# Patient Record
Sex: Female | Born: 1950 | Race: White | Hispanic: No | Smoking: Never smoker
Health system: Southern US, Community
[De-identification: ages and names within clinical notes are randomized; demographics above are authoritative.]

## PROBLEM LIST (undated history)

## (undated) DIAGNOSIS — R232 Flushing: Secondary | ICD-10-CM

## (undated) DIAGNOSIS — Z800 Family history of malignant neoplasm of digestive organs: Secondary | ICD-10-CM

## (undated) DIAGNOSIS — E785 Hyperlipidemia, unspecified: Secondary | ICD-10-CM

## (undated) DIAGNOSIS — C50919 Malignant neoplasm of unspecified site of unspecified female breast: Secondary | ICD-10-CM

## (undated) DIAGNOSIS — C50211 Malignant neoplasm of upper-inner quadrant of right female breast: Secondary | ICD-10-CM

## (undated) DIAGNOSIS — T7840XA Allergy, unspecified, initial encounter: Secondary | ICD-10-CM

## (undated) DIAGNOSIS — E039 Hypothyroidism, unspecified: Secondary | ICD-10-CM

## (undated) DIAGNOSIS — Z803 Family history of malignant neoplasm of breast: Secondary | ICD-10-CM

## (undated) DIAGNOSIS — IMO0001 Reserved for inherently not codable concepts without codable children: Secondary | ICD-10-CM

## (undated) DIAGNOSIS — E0590 Thyrotoxicosis, unspecified without thyrotoxic crisis or storm: Secondary | ICD-10-CM

## (undated) DIAGNOSIS — IMO0002 Reserved for concepts with insufficient information to code with codable children: Secondary | ICD-10-CM

## (undated) DIAGNOSIS — Z923 Personal history of irradiation: Secondary | ICD-10-CM

## (undated) HISTORY — PX: TUBAL LIGATION: SHX77

## (undated) HISTORY — DX: Malignant neoplasm of upper-inner quadrant of right female breast: C50.211

## (undated) HISTORY — DX: Hyperlipidemia, unspecified: E78.5

## (undated) HISTORY — DX: Allergy, unspecified, initial encounter: T78.40XA

## (undated) HISTORY — PX: COLONOSCOPY: SHX174

## (undated) HISTORY — DX: Family history of malignant neoplasm of breast: Z80.3

## (undated) HISTORY — PX: TONSILLECTOMY: SUR1361

## (undated) HISTORY — DX: Reserved for inherently not codable concepts without codable children: IMO0001

## (undated) HISTORY — DX: Reserved for concepts with insufficient information to code with codable children: IMO0002

## (undated) HISTORY — PX: WISDOM TOOTH EXTRACTION: SHX21

## (undated) HISTORY — DX: Family history of malignant neoplasm of digestive organs: Z80.0

## (undated) HISTORY — DX: Flushing: R23.2

## (undated) HISTORY — PX: KNEE ARTHROSCOPY: SUR90

## (undated) HISTORY — PX: DILATION AND CURETTAGE OF UTERUS: SHX78

---

## 1993-08-07 HISTORY — PX: PLACEMENT OF BREAST IMPLANTS: SHX6334

## 1997-03-10 HISTORY — PX: AUGMENTATION MAMMAPLASTY: SUR837

## 1999-09-19 ENCOUNTER — Encounter (INDEPENDENT_AMBULATORY_CARE_PROVIDER_SITE_OTHER): Payer: Self-pay | Admitting: Specialist

## 1999-09-19 ENCOUNTER — Other Ambulatory Visit: Admission: RE | Admit: 1999-09-19 | Discharge: 1999-09-19 | Payer: Self-pay | Admitting: Obstetrics and Gynecology

## 2000-04-25 ENCOUNTER — Encounter (INDEPENDENT_AMBULATORY_CARE_PROVIDER_SITE_OTHER): Payer: Self-pay | Admitting: Specialist

## 2000-04-25 ENCOUNTER — Other Ambulatory Visit: Admission: RE | Admit: 2000-04-25 | Discharge: 2000-04-25 | Payer: Self-pay | Admitting: Obstetrics and Gynecology

## 2000-09-25 ENCOUNTER — Other Ambulatory Visit: Admission: RE | Admit: 2000-09-25 | Discharge: 2000-09-25 | Payer: Self-pay | Admitting: Obstetrics and Gynecology

## 2000-09-25 ENCOUNTER — Encounter (INDEPENDENT_AMBULATORY_CARE_PROVIDER_SITE_OTHER): Payer: Self-pay | Admitting: Specialist

## 2000-10-05 ENCOUNTER — Encounter (INDEPENDENT_AMBULATORY_CARE_PROVIDER_SITE_OTHER): Payer: Self-pay

## 2000-10-05 ENCOUNTER — Other Ambulatory Visit: Admission: RE | Admit: 2000-10-05 | Discharge: 2000-10-05 | Payer: Self-pay | Admitting: Obstetrics and Gynecology

## 2001-04-25 ENCOUNTER — Other Ambulatory Visit: Admission: RE | Admit: 2001-04-25 | Discharge: 2001-04-25 | Payer: Self-pay | Admitting: Obstetrics and Gynecology

## 2001-09-25 ENCOUNTER — Other Ambulatory Visit: Admission: RE | Admit: 2001-09-25 | Discharge: 2001-09-25 | Payer: Self-pay | Admitting: Obstetrics and Gynecology

## 2002-03-25 ENCOUNTER — Other Ambulatory Visit: Admission: RE | Admit: 2002-03-25 | Discharge: 2002-03-25 | Payer: Self-pay | Admitting: Obstetrics and Gynecology

## 2002-10-09 ENCOUNTER — Other Ambulatory Visit: Admission: RE | Admit: 2002-10-09 | Discharge: 2002-10-09 | Payer: Self-pay | Admitting: Obstetrics and Gynecology

## 2003-08-11 ENCOUNTER — Other Ambulatory Visit: Admission: RE | Admit: 2003-08-11 | Discharge: 2003-08-11 | Payer: Self-pay | Admitting: Obstetrics and Gynecology

## 2004-02-23 ENCOUNTER — Other Ambulatory Visit: Admission: RE | Admit: 2004-02-23 | Discharge: 2004-02-23 | Payer: Self-pay | Admitting: Obstetrics and Gynecology

## 2004-10-21 ENCOUNTER — Other Ambulatory Visit: Admission: RE | Admit: 2004-10-21 | Discharge: 2004-10-21 | Payer: Self-pay | Admitting: Obstetrics and Gynecology

## 2005-03-17 ENCOUNTER — Other Ambulatory Visit: Admission: RE | Admit: 2005-03-17 | Discharge: 2005-03-17 | Payer: Self-pay | Admitting: Obstetrics and Gynecology

## 2005-07-14 ENCOUNTER — Encounter: Admission: RE | Admit: 2005-07-14 | Discharge: 2005-07-14 | Payer: Self-pay | Admitting: Family Medicine

## 2005-08-01 ENCOUNTER — Encounter: Admission: RE | Admit: 2005-08-01 | Discharge: 2005-08-01 | Payer: Self-pay | Admitting: Family Medicine

## 2006-05-09 ENCOUNTER — Other Ambulatory Visit: Admission: RE | Admit: 2006-05-09 | Discharge: 2006-05-09 | Payer: Self-pay | Admitting: Obstetrics and Gynecology

## 2008-02-19 ENCOUNTER — Ambulatory Visit: Payer: Self-pay | Admitting: Internal Medicine

## 2008-03-04 ENCOUNTER — Ambulatory Visit: Payer: Self-pay | Admitting: Internal Medicine

## 2010-09-06 NOTE — Procedures (Signed)
Summary: Colonoscopy   Colonoscopy  Procedure date:  03/04/2008  Findings:      Location:  [facility].    Procedures Next Due Date:    Colonoscopy: 02/2018  Patient Name: [name], [name]. MRN:  Procedure Procedures: Colonoscopy CPT: [id number].  Personnel: Endoscopist: Wilhemina Bonito. Marina Goodell, MD.  Exam Location: Exam performed in Outpatient Clinic. Outpatient  Patient Consent: Procedure, Alternatives, Risks and Benefits discussed, consent obtained, from patient. Consent was obtained by the RN.  Indications  Surveillance of: inflammatory polyp on colonoscopy 02-2003.  History  Current Medications: Patient is not currently taking Coumadin.  Pre-Exam Physical: Performed Mar 04, 2008. Cardio-pulmonary exam, Rectal exam, HEENT exam , Abdominal exam, Mental status exam WNL.  Comments: Pt. history reviewed/updated, physical exam performed prior to initiation of sedation?yes Exam Exam: Extent of exam reached: Cecum, extent intended: Cecum.  The cecum was identified by appendiceal orifice and IC valve. Patient position: on left side. Time to Cecum: 00:04:31. Time for Withdrawl: 00:12:23. Colon retroflexion performed. Images taken. ASA Classification: I. Tolerance: excellent.  Monitoring: Pulse and BP monitoring, Oximetry used. Supplemental O2 given.  Colon Prep Used Movi prep for colon prep. Prep results: excellent.  Sedation Meds: Patient assessed and found to be appropriate for moderate (conscious) sedation. Fentanyl 75 mcg. given IV. Versed 6 mg. given IV.  Findings NORMAL EXAM: Cecum to Rectum.   Assessment Normal examination.  Comments: NO POLYPS SEEN Events  Unplanned Interventions: No intervention was required.  Unplanned Events: There were no complications. Plans Disposition: After procedure patient sent to recovery. After recovery patient sent home.  Scheduling/Referral: Colonoscopy, to Wilhemina Bonito. Marina Goodell, MD, in 10 years for routine screening,      cc.   Trinna Post B. Kindl,MD

## 2010-09-06 NOTE — Miscellaneous (Signed)
Summary: lec previsit prep  Clinical Lists Changes  Medications: Added new medication of MOVIPREP 100 GM  SOLR (PEG-KCL-NACL-NASULF-NA ASC-C) As per prep instructions. - Signed Rx of MOVIPREP 100 GM  SOLR (PEG-KCL-NACL-NASULF-NA ASC-C) As per prep instructions.;  #1 x 0;  Signed;  Entered by: Wyona Almas RN;  Authorized by: Hilarie Fredrickson MD;  Method used: Electronic Observations: Added new observation of NKA: T (02/19/2008 16:13)    Prescriptions: MOVIPREP 100 GM  SOLR (PEG-KCL-NACL-NASULF-NA ASC-C) As per prep instructions.  #1 x 0   Entered by:   Wyona Almas RN   Authorized by:   Hilarie Fredrickson MD   Signed by:   Wyona Almas RN on 02/19/2008   Method used:   Electronically sent to ...       [facility]  [address] [id number]*       [address]       [place], [place]  [address]       Ph: [phone]       Fax: [phone]   RxID:   [id number]

## 2010-10-31 ENCOUNTER — Other Ambulatory Visit: Payer: Self-pay | Admitting: Dermatology

## 2011-05-05 LAB — GLUCOSE, CAPILLARY: Glucose-Capillary: 103 — ABNORMAL HIGH

## 2012-01-18 ENCOUNTER — Other Ambulatory Visit: Payer: Self-pay

## 2013-09-08 ENCOUNTER — Ambulatory Visit (INDEPENDENT_AMBULATORY_CARE_PROVIDER_SITE_OTHER): Payer: BC Managed Care – PPO

## 2013-09-08 ENCOUNTER — Encounter: Payer: Self-pay | Admitting: Podiatry

## 2013-09-08 ENCOUNTER — Ambulatory Visit (INDEPENDENT_AMBULATORY_CARE_PROVIDER_SITE_OTHER): Payer: BC Managed Care – PPO | Admitting: Podiatry

## 2013-09-08 VITALS — BP 111/60 | HR 68 | Resp 18

## 2013-09-08 DIAGNOSIS — M79609 Pain in unspecified limb: Secondary | ICD-10-CM

## 2013-09-08 DIAGNOSIS — M722 Plantar fascial fibromatosis: Secondary | ICD-10-CM

## 2013-09-08 MED ORDER — TRIAMCINOLONE ACETONIDE 10 MG/ML IJ SUSP
10.0000 mg | Freq: Once | INTRAMUSCULAR | Status: AC
  Administered 2013-09-08: 10 mg

## 2013-09-08 NOTE — Patient Instructions (Signed)
  Use over-the-counter Aleve by mouth 1 twice a day from 7-30 days. Reappoint if symptoms are not improving in 30 days   Plantar Fasciitis Plantar fasciitis is a common condition that causes foot pain. It is soreness (inflammation) of the band of tough fibrous tissue on the bottom of the foot that runs from the heel bone (calcaneus) to the ball of the foot. The cause of this soreness may be from excessive standing, poor fitting shoes, running on hard surfaces, being overweight, having an abnormal walk, or overuse (this is common in runners) of the painful foot or feet. It is also common in aerobic exercise dancers and ballet dancers. SYMPTOMS  Most people with plantar fasciitis complain of:  Severe pain in the morning on the bottom of their foot especially when taking the first steps out of bed. This pain recedes after a few minutes of walking.  Severe pain is experienced also during walking following a long period of inactivity.  Pain is worse when walking barefoot or up stairs DIAGNOSIS   Your caregiver will diagnose this condition by examining and feeling your foot.  Special tests such as X-rays of your foot, are usually not needed. PREVENTION   Consult a sports medicine professional before beginning a new exercise program.  Walking programs offer a good workout. With walking there is a lower chance of overuse injuries common to runners. There is less impact and less jarring of the joints.  Begin all new exercise programs slowly. If problems or pain develop, decrease the amount of time or distance until you are at a comfortable level.  Wear good shoes and replace them regularly.  Stretch your foot and the heel cords at the back of the ankle (Achilles tendon) both before and after exercise.  Run or exercise on even surfaces that are not hard. For example, asphalt is better than pavement.  Do not run barefoot on hard surfaces.  If using a treadmill, vary the incline.  Do not  continue to workout if you have foot or joint problems. Seek professional help if they do not improve. HOME CARE INSTRUCTIONS   Avoid activities that cause you pain until you recover.  Use ice or cold packs on the problem or painful areas after working out.  Only take over-the-counter or prescription medicines for pain, discomfort, or fever as directed by your caregiver.  Soft shoe inserts or athletic shoes with air or gel sole cushions may be helpful.  If problems continue or become more severe, consult a sports medicine caregiver or your own health care provider. Cortisone is a potent anti-inflammatory medication that may be injected into the painful area. You can discuss this treatment with your caregiver. MAKE SURE YOU:   Understand these instructions.  Will watch your condition.  Will get help right away if you are not doing well or get worse. Document Released: 04/18/2001 Document Revised: 10/16/2011 Document Reviewed: 06/17/2008 [facility] Patient Information 2014 [place], [place].

## 2013-09-08 NOTE — Progress Notes (Signed)
   Subjective:    Patient ID: [name], female    DOB: [birth year], 63 y.o.   MRN: [id number]  HPI My heels hurt on the side and on the bottom of both and been going on for a month and hurts to walk and hurts am and hurts after I sit and get up.  There is no history of direct injury.     Review of Systems  Musculoskeletal: Positive for gait problem.  All other systems reviewed and are negative.       Objective:   Physical Exam  Orientated x3 space white female  Vascular: The DP and PT pulses are 2/4 bilaterally  Neurological: Knee and ankle reflexes are strong and reactive bilaterally  Dermatological: Texture and turgor within normal limits  Musculoskeletal: Medium high arch contour noted bilaterally with the heel in a varus position upon weightbearing. There is palpable tenderness in the medial plantar heel area and mid arch bilaterally. There is slight palpable tenderness along the lateral fifth metatarsal area in the left foot without a palpable lesions.  X-rays taken today of the right and left feet demonstrates slight pointing of the inferior heels right and left with a high pitch calcaneus bilaterally.     Assessment & Plan:   Assessment: Bilateral plantar fasciitis Altered gait causing some lateral column pain on the left foot  Plan: The heels are prepped with alcohol and Betadine and 10 mg of Kenalog mixed with 10 mg of plain Xylocaine and 2.5 mg of plain Marcaine are injected into each right and left heels for Kenalog injection 1.  Shoeing and stretching discussed. Suggested over-the-counter Aleve 1 tablet twice a day from 7-30 days. An after visit summary provided for plantar fasciitis  Reappoint x30 days the symptoms are not improving.

## 2013-09-10 ENCOUNTER — Telehealth: Payer: Self-pay | Admitting: *Deleted

## 2013-09-10 NOTE — Telephone Encounter (Signed)
Pt states she did not get the stretching exercises.  Dr Phoebe Perch stretching instructions sent to pt's home address.

## 2013-11-20 ENCOUNTER — Other Ambulatory Visit: Payer: Self-pay | Admitting: Dermatology

## 2014-05-11 ENCOUNTER — Encounter: Payer: Self-pay | Admitting: Internal Medicine

## 2014-08-07 DIAGNOSIS — Z923 Personal history of irradiation: Secondary | ICD-10-CM

## 2014-08-07 HISTORY — DX: Personal history of irradiation: Z92.3

## 2014-08-27 ENCOUNTER — Other Ambulatory Visit: Payer: Self-pay | Admitting: Obstetrics and Gynecology

## 2014-08-27 DIAGNOSIS — R928 Other abnormal and inconclusive findings on diagnostic imaging of breast: Secondary | ICD-10-CM

## 2014-09-11 ENCOUNTER — Ambulatory Visit
Admission: RE | Admit: 2014-09-11 | Discharge: 2014-09-11 | Disposition: A | Discharge status: Home / Self Care | Payer: BLUE CROSS/BLUE SHIELD | Source: Ambulatory Visit | Attending: Obstetrics and Gynecology | Admitting: Obstetrics and Gynecology

## 2014-09-11 ENCOUNTER — Other Ambulatory Visit: Payer: Self-pay | Admitting: Obstetrics and Gynecology

## 2014-09-11 DIAGNOSIS — R928 Other abnormal and inconclusive findings on diagnostic imaging of breast: Secondary | ICD-10-CM

## 2014-09-11 DIAGNOSIS — C50919 Malignant neoplasm of unspecified site of unspecified female breast: Secondary | ICD-10-CM

## 2014-09-11 HISTORY — DX: Malignant neoplasm of unspecified site of unspecified female breast: C50.919

## 2014-09-11 HISTORY — PX: BREAST BIOPSY: SHX20

## 2014-09-14 ENCOUNTER — Other Ambulatory Visit: Payer: Self-pay | Admitting: Obstetrics and Gynecology

## 2014-09-14 DIAGNOSIS — C50911 Malignant neoplasm of unspecified site of right female breast: Secondary | ICD-10-CM

## 2014-09-15 ENCOUNTER — Telehealth: Payer: Self-pay | Admitting: *Deleted

## 2014-09-15 ENCOUNTER — Encounter: Payer: Self-pay | Admitting: *Deleted

## 2014-09-15 DIAGNOSIS — C50211 Malignant neoplasm of upper-inner quadrant of right female breast: Secondary | ICD-10-CM | POA: Insufficient documentation

## 2014-09-15 HISTORY — DX: Malignant neoplasm of upper-inner quadrant of right female breast: C50.211

## 2014-09-15 NOTE — Telephone Encounter (Signed)
Confirmed [facility] for 09/23/14 at 0800.  Instructions and contact information given.

## 2014-09-17 ENCOUNTER — Ambulatory Visit
Admission: RE | Admit: 2014-09-17 | Discharge: 2014-09-17 | Disposition: A | Discharge status: Home / Self Care | Payer: BLUE CROSS/BLUE SHIELD | Source: Ambulatory Visit | Attending: Obstetrics and Gynecology | Admitting: Obstetrics and Gynecology

## 2014-09-17 DIAGNOSIS — C50911 Malignant neoplasm of unspecified site of right female breast: Secondary | ICD-10-CM

## 2014-09-17 MED ORDER — GADOBENATE DIMEGLUMINE 529 MG/ML IV SOLN
13.0000 mL | Freq: Once | INTRAVENOUS | Status: AC | PRN
  Administered 2014-09-17: 13 mL via INTRAVENOUS

## 2014-09-22 ENCOUNTER — Other Ambulatory Visit: Payer: BLUE CROSS/BLUE SHIELD

## 2014-09-23 ENCOUNTER — Other Ambulatory Visit (HOSPITAL_BASED_OUTPATIENT_CLINIC_OR_DEPARTMENT_OTHER): Payer: BLUE CROSS/BLUE SHIELD

## 2014-09-23 ENCOUNTER — Ambulatory Visit: Payer: BLUE CROSS/BLUE SHIELD

## 2014-09-23 ENCOUNTER — Encounter: Payer: Self-pay | Admitting: Hematology

## 2014-09-23 ENCOUNTER — Ambulatory Visit (HOSPITAL_BASED_OUTPATIENT_CLINIC_OR_DEPARTMENT_OTHER): Payer: BLUE CROSS/BLUE SHIELD | Admitting: Hematology

## 2014-09-23 ENCOUNTER — Ambulatory Visit
Admission: RE | Admit: 2014-09-23 | Discharge: 2014-09-23 | Disposition: A | Discharge status: Home / Self Care | Payer: BLUE CROSS/BLUE SHIELD | Source: Ambulatory Visit | Attending: Radiation Oncology | Admitting: Radiation Oncology

## 2014-09-23 ENCOUNTER — Encounter: Payer: Self-pay | Admitting: Skilled Nursing Facility1

## 2014-09-23 ENCOUNTER — Encounter: Payer: Self-pay | Admitting: *Deleted

## 2014-09-23 VITALS — BP 128/65 | HR 66 | Temp 98.6°F | Resp 18 | Ht 61.0 in | Wt 144.9 lb

## 2014-09-23 DIAGNOSIS — D0511 Intraductal carcinoma in situ of right breast: Secondary | ICD-10-CM

## 2014-09-23 DIAGNOSIS — Z171 Estrogen receptor negative status [ER-]: Secondary | ICD-10-CM

## 2014-09-23 DIAGNOSIS — C50211 Malignant neoplasm of upper-inner quadrant of right female breast: Secondary | ICD-10-CM

## 2014-09-23 LAB — CBC WITH DIFFERENTIAL/PLATELET
BASO%: 0.7 % (ref 0.0–2.0)
Basophils Absolute: 0 10*3/uL (ref 0.0–0.1)
EOS%: 0.7 % (ref 0.0–7.0)
Eosinophils Absolute: 0 10*3/uL (ref 0.0–0.5)
HCT: 43.2 % (ref 34.8–46.6)
HGB: 13.9 g/dL (ref 11.6–15.9)
LYMPH%: 38.4 % (ref 14.0–49.7)
MCH: 28.3 pg (ref 25.1–34.0)
MCHC: 32.2 g/dL (ref 31.5–36.0)
MCV: 88 fL (ref 79.5–101.0)
MONO#: 0.5 10*3/uL (ref 0.1–0.9)
MONO%: 7.4 % (ref 0.0–14.0)
NEUT#: 3.8 10*3/uL (ref 1.5–6.5)
NEUT%: 52.8 % (ref 38.4–76.8)
Platelets: 270 10*3/uL (ref 145–400)
RBC: 4.91 10*6/uL (ref 3.70–5.45)
RDW: 13.4 % (ref 11.2–14.5)
WBC: 7.1 10*3/uL (ref 3.9–10.3)
lymph#: 2.7 10*3/uL (ref 0.9–3.3)

## 2014-09-23 LAB — COMPREHENSIVE METABOLIC PANEL (CC13)
ALT: 20 U/L (ref 0–55)
AST: 19 U/L (ref 5–34)
Albumin: 3.9 g/dL (ref 3.5–5.0)
Alkaline Phosphatase: 88 U/L (ref 40–150)
Anion Gap: 9 mEq/L (ref 3–11)
BUN: 22.2 mg/dL (ref 7.0–26.0)
CO2: 27 mEq/L (ref 22–29)
Calcium: 9.5 mg/dL (ref 8.4–10.4)
Chloride: 110 mEq/L — ABNORMAL HIGH (ref 98–109)
Creatinine: 0.8 mg/dL (ref 0.6–1.1)
EGFR: 81 mL/min/{1.73_m2} — ABNORMAL LOW (ref >90)
Glucose: 95 mg/dl (ref 70–140)
Potassium: 4.5 mEq/L (ref 3.5–5.1)
Sodium: 145 mEq/L (ref 136–145)
Total Bilirubin: 0.37 mg/dL (ref 0.20–1.20)
Total Protein: 6.6 g/dL (ref 6.4–8.3)

## 2014-09-23 NOTE — Progress Notes (Signed)
[name] is a very pleasant 64 y.o. 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  Biopsy results revealed the tumor's hormone status as ER negative and PR negative.   She presents today with her husband to the [facility]) for treatment consideration and recommendations from the breast surgeon, radiation oncologist, and medical oncologist.     I briefly met with [name] and her husband during her [facility] visit today. We discussed the purpose of the Survivorship Clinic, which will include monitoring for recurrence, coordinating completion of age and gender-appropriate cancer screenings, promotion of overall wellness, as well as managing potential late/long-term side effects of anti-cancer treatments.    As of today, the treatment plan for [name] will likely include surgery and radiation therapy.  She will meet with the Genetics Counselor due to her family history of breast cancer.  The intent of treatment for [name] is cure, therefore she will be eligible for the Survivorship Clinic upon her completion of treatment.  Her survivorship care plan (SCP) document will be drafted and updated throughout the course of her treatment trajectory. She will receive the SCP in an office visit with myself in the Survivorship Clinic once she has completed treatment.   [name] was encouraged to ask questions and all questions were answered to her satisfaction.  She was given my business card and encouraged to contact me with any concerns regarding survivorship.  I look forward to participating in her care.   Mike Craze, NP [place] [phone]

## 2014-09-23 NOTE — Progress Notes (Signed)
Checked in new pt with no financial concerns prior to seeing the dr.  Informed pt if chemo is part of her treatment Raquel will call to see if Josem Kaufmann is req and will obtain it if it is as well as contact foundations that offer copay assistance for chemo if needed.  She has Raquel's card for any billing questions or concerns.

## 2014-09-23 NOTE — Progress Notes (Signed)
[place] Clinical Social Work [employer] Psychosocial Distress Screening  Patient completed distress screening protocol and scored a 2 on the Psychosocial Distress Thermometer which indicates mild distress. Clinical Social Worker met with patient and patients husband in [facility] to assess for distress and other psychosocial needs.  Patient stated she was doing "ok" and felt comfortable with her treatment plan, but was anxious to get the remaining test complete.  CSW and patient discussed common feelings and emotions when diagnosed with cancer.  CSW informed patient on the support team and support services at [facility], and patient was agreeable to an [employer] referral.  CSW provided contact information and encouraged patient to cal with any questions or concerns.        ONCBCN DISTRESS SCREENING 09/23/2014  Screening Type Initial Screening  Distress experienced in past week (1-10) 2  Family Problem type Children  Spiritual/Religous concerns type Relating to God  Information Concerns Type Lack of info about treatment  Physical Problem type Constipation/diarrhea  Physician notified of physical symptoms Yes  Referral to clinical psychology No  Referral to clinical social work Yes  Referral to dietition No  Referral to financial advocate No  Referral to support programs Yes  Referral to palliative care No   Johnnye Lana, MSW, LCSW, OSW-C Clinical Social Worker [place] [phone]

## 2014-09-23 NOTE — Progress Notes (Signed)
Subjective:     Patient ID: [name], female   DOB: Jul 14, 1951, 64 y.o.   MRN: [id number]  HPI   Review of Systems     Objective:   Physical Exam For the patient to understand and be given the tools to implement a healthy plant based diet during their cancer diagnosis.    Assessment:        Patient was seen today and found to be in good spirits and was accompanied by her seemingly supportive husband. Patients current/relevant medications: biotin, multi-vitamin, miralax, and simvastatin. Patient states she usually goes three days without having a bowel movement. Patient states the lack of defecation has not affected her appetite. Patient states she is taking biotin for her hair and nail health.  Patients weight (09/23/14) is 144 pounds at 78'1'' with a BMI of 27.4.  Plan:     Dietitian educated the patient on implementing a plant based diet by incorporating more plant proteins, fruits, and vegetables. As a part of a healthy routine physical activity was discussed. The importance of legitimate, evidence based information was discussed and examples were given. A folder of evidence based information with a focus on a plant based diet and general nutrition during cancer was given to the patient.  Dietitian educated patient on natural therapies in order to move her bowel: consuming fruits and vegetables, drinking enough water, and being physically active every day.  Dietitian educated the patient on the regulation of supplements and the lacking research for biotin to be used in this manner.  As a part of the continuum of care the cancer dietitian's contact information was given to the patient in the event they would like to have a follow up appointment.

## 2014-09-23 NOTE — Progress Notes (Signed)
  Radiation Oncology         [phone]) [phone] ________________________________  Initial outpatient Consultation - Date: 09/23/2014   Name: [name] MRN: [id number]   DOB: Jan 21, 1951  REFERRING PHYSICIAN: Erroll Luna, MD  DIAGNOSIS:    ICD-9-CM ICD-10-CM   1. Breast cancer of upper-inner quadrant of right female breast 174.2 C50.211     STAGE: Breast cancer of upper-inner quadrant of right female breast   Staging form: Breast, AJCC 7th Edition     Clinical stage from 09/23/2014: Stage 0 (Tis (DCIS), N0, M0) - Unsigned  HISTORY OF PRESENT ILLNESS::[name] is a 64 y.o. female  Who had implants placed in the 1990s. She had a screening mammogram performed which showed a new area of calcifications in the right breast. These measured about 2.6 cm. She had a biopsy which showed HG DCIS with calcifications and necrosis. There was suspicion for invasive disease. The DCIS was ER and PR+. Her father had breast, prostate and head and neck cancer. She is scheduled for genetics tomorrow. She is accompanied by her husband. She is GXP2 with her first live birth at 32. She is post menopausal with her first period at 40 or 67.   PREVIOUS RADIATION THERAPY: No  Past medical, social and family history were reviewed in the electronic chart. Review of symptoms was reviewed in the electronic chart. Medications were reviewed in the electronic chart.   PHYSICAL EXAM: There were no vitals filed for this visit.. . She is a pleasant female with implants in place. She has biopsy change near the nipple of the right breast. She has no palpable abnormalities of the left breast. No palpable cervical, supraclavicular or axillary adenopathy. She is alert and oriented x 3.   IMPRESSION: DCIS (suspicious areas for microinvasion)  PLAN:I spoke to the patient today regarding her diagnosis and options for treatment. We discussed the equivalence in terms of survival and local failure between mastectomy and breast  conservation. We discussed the role of radiation in decreasing local failures in patients who undergo lumpectomy. We discussed the process of simulation and the placement tattoos. We discussed 4-6 weeks of treatment as an outpatient. We discussed the possibility of asymptomatic lung damage. We discussed the low likelihood of secondary malignancies. We discussed the possible side effects including but not limited to skin redness, fatigue, permanent skin darkening, and breast swelling.   We discussed that these recommendations would be based on negative genetic counseling. If her genetic counseling reveals a BRCA mutation she may elect for bilateral mastectomies.   We also discussed the difference between the radiation her father received for head and neck cancer versus radiation for breast cancer.    I will plan on seeing her back after her surgery. She met with our navigators, dietician and Education officer, museum.   I spent 60 minutes  face to face with the patient and more than 50% of that time was spent in counseling and/or coordination of care.   ------------------------------------------------  Thea Silversmith, MD

## 2014-09-24 ENCOUNTER — Other Ambulatory Visit: Payer: BLUE CROSS/BLUE SHIELD

## 2014-09-24 ENCOUNTER — Ambulatory Visit (HOSPITAL_BASED_OUTPATIENT_CLINIC_OR_DEPARTMENT_OTHER): Payer: BLUE CROSS/BLUE SHIELD | Admitting: Genetic Counselor

## 2014-09-24 ENCOUNTER — Encounter: Payer: Self-pay | Admitting: Genetic Counselor

## 2014-09-24 DIAGNOSIS — Z803 Family history of malignant neoplasm of breast: Secondary | ICD-10-CM

## 2014-09-24 DIAGNOSIS — D0511 Intraductal carcinoma in situ of right breast: Secondary | ICD-10-CM

## 2014-09-24 DIAGNOSIS — Z800 Family history of malignant neoplasm of digestive organs: Secondary | ICD-10-CM | POA: Insufficient documentation

## 2014-09-24 DIAGNOSIS — Z315 Encounter for genetic counseling: Secondary | ICD-10-CM

## 2014-09-24 DIAGNOSIS — D0510 Intraductal carcinoma in situ of unspecified breast: Secondary | ICD-10-CM | POA: Insufficient documentation

## 2014-09-24 NOTE — Progress Notes (Signed)
[facility] Patient Visit  REFERRING PROVIDER: Ricke Hey, MD [place], [place] [address]  PRIMARY PROVIDER:  Ricke Hey, MD  PRIMARY REASON FOR VISIT:  1. DCIS (ductal carcinoma in situ), right   2. Family history of malignant neoplasm of breast   3. Family history of malignant neoplasm of gastrointestinal tract     HISTORY OF PRESENT ILLNESS:   [name], a 64 y.o. female, was seen for a [facility] cancer genetics consultation at the request of Dr. Alyson Turner due to a personal and family history of cancer.  [name] presents to clinic today to discuss the possibility of a hereditary predisposition to cancer, genetic testing, and to further clarify her future cancer risks, as well as potential cancer risks for family members. She also would like to use genetic test results for surgical decisions for her breast cancer diagnosis.  CANCER HISTORY:    Breast cancer of upper-inner quadrant of right female breast   09/11/2014 Breast US In the right breast at 12 o'clock, 3 cm the nipple measuring approximately 2 cm x 0.9 cm x 1.6 cm in size. No discrete suspicious mass.   09/11/2014 Mammogram In the right breast, an area of pleomorphic calcifications and associated architectural distortion and irregular density lies in the 1 o'clock position of the right breast. No other suspicious findings in the right breast.    09/14/2014 Receptors her2 Estrogen Receptor: 0%, NEGATIVE Progesterone Receptor: 0%, NEGATIVE   09/14/2014 Initial Biopsy Breast, right, needle core biopsy, 11-12 o'clock, upper outer quadrant - HIGH GRADE DUCTAL CARCINOMA IN SITU WITH NECROSIS AND CALCIFICATIONS   09/15/2014 Initial Diagnosis Breast cancer of upper-inner quadrant of right female breast   09/17/2014 Breast MRI Non mass enhancement in the upper outer quadrant of the right breast, biopsy proven high-grade DCIS. Measurements are given above. Post biopsy  changes are present at the anterior margin of the enhancement.    09/17/2014 Breast MRI No evidence of malignancy elsewhere in either breast. 3. Biopsy proven fibrocystic changes in the upper outer periareolar right breast manifested as a 9 x 7 mm mass. 4. No pathologic lymphadenopathy.   Past Medical History  Diagnosis Date   Allergy    Hyperlipidemia    Breast cancer of upper-inner quadrant of right female breast 09/15/2014   Hot flashes     Past Surgical History  Procedure Laterality Date   Placement of breast implants      History   Social History   Marital Status: Married    Spouse Name: N/A   Number of Children: N/A   Years of Education: N/A   Social History Main Topics   Smoking status: Never Smoker    Smokeless tobacco: Never Used   Alcohol Use: Yes     Comment: occasional   Drug Use: No   Sexual Activity: Not on file   Other Topics Concern   Not on file   Social History Narrative     FAMILY HISTORY:  During the visit, a 4-generation pedigree was obtained. A copy of the pedigree with be scanned into Epic under the Media tab. Significant family history diagnoses include the following: Family History  Problem Relation Age of Onset   Breast cancer Father 18   Prostate cancer Father 58   Throat cancer Father 37   Pancreatic cancer Maternal Uncle 75   Pancreatic cancer Maternal Grandmother 81   Cancer Maternal Grandfather 68    leukemia  [name] ancestry is of Caucasian descent. There is no known Jewish ancestry or consanguinity.  GENETIC COUNSELING ASSESSMENT:  [name] is a 64 y.o. female with a personal and family history of cancer suggestive of a hereditary predisposition to cancer. We, therefore, discussed and recommended the following at today's visit.   DISCUSSION:  We reviewed the characteristics, features and inheritance patterns of hereditary cancer syndromes. We also discussed genetic testing, including the appropriate  family members to test, the process of testing, insurance coverage and turn-around-time for results. We discussed the implications of a negative, positive and/or variant of uncertain significant result.   We recommended [name] pursue genetic testing for the BRCAplus gene panel which looks for mutations in the BRCA1, BRCA2, PTEN, CDH1, PALB2 and TP53 genes so that she can use this information for surgical decisions for her diagnosis. If this test is negative, we recommended she pursue reflex testing for the BreastNext gene panel.   PLAN:  Based on our above recommendation, [name] wished to pursue genetic testing and the blood sample was drawn and will be sent to [facility] for analysis. Results should be available within approximately 2 weeks time for the BRCAplus gene panel, at which point they will be disclosed by telephone to [name], as will any additional recommendations warranted by these results. If BreastNext gene panel testing is pursued, these results will take an additional 2-3 weeks and again will be disclosed to her once available. Lastly, we encouraged [name] to remain in contact with cancer genetics annually so that we can continuously update the family history and inform her of any changes in cancer genetics and testing that may be of benefit for this family.   Ms.  Turner questions were answered to her satisfaction today. Our contact information was provided should additional questions or concerns arise. Thank you for the referral and allowing [place] to share in the care of your patient.   Jocelyn A. Fine, MS, CGC Certified Psychologist, sport and [email or web address] phone: [phone]  The patient was seen for a total of 30 minutes in face-to-face genetic counseling.  This patient was discussed with Dr. Burr Medico who agrees with the above.    ______________________________________________________________________ For Office Staff:  Number of people  involved in session including genetic counselor: 2 Was an intern or student involved with case: not applicable

## 2014-09-27 ENCOUNTER — Encounter: Payer: Self-pay | Admitting: Hematology

## 2014-09-27 NOTE — Progress Notes (Signed)
[place]  [phone]) [phone] Fax:([phone]) [place] Note   Patient Care Team: Ricke Hey, MD as PCP - General (Family Medicine) Erroll Luna, MD as Consulting Physician (General Surgery) [name], MD as Consulting Physician (Hematology) Thea Silversmith, MD as Consulting Physician (Radiation Oncology) Roselee Culver, RN as Registered Nurse [facility], RN as Registered Nurse Trinda Pascal, NP as Nurse Practitioner (Nurse Practitioner) 09/27/2014  CHIEF COMPLAINTS/PURPOSE OF CONSULTATION:  Newly diagnosed right breast DCIS    Breast cancer of upper-inner quadrant of right female breast   09/11/2014 Breast US In the right breast at 12 o'clock, 3 cm the nipple measuring approximately 2 cm x 0.9 cm x 1.6 cm in size. No discrete suspicious mass.   09/11/2014 Mammogram In the right breast, an area of pleomorphic calcifications and associated architectural distortion and irregular density lies in the 1 o'clock position of the right breast. No other suspicious findings in the right breast.    09/14/2014 Receptors her2 Estrogen Receptor: 0%, NEGATIVE Progesterone Receptor: 0%, NEGATIVE   09/14/2014 Initial Biopsy Breast, right, needle core biopsy, 11-12 o'clock, upper outer quadrant - HIGH GRADE DUCTAL CARCINOMA IN SITU WITH NECROSIS AND CALCIFICATIONS   09/15/2014 Initial Diagnosis Breast cancer of upper-inner quadrant of right female breast   09/17/2014 Breast MRI Non mass enhancement in the upper outer quadrant of the right breast, biopsy proven high-grade DCIS. Measurements are given above. Post biopsy changes are present at the anterior margin of the enhancement.    09/17/2014 Breast MRI No evidence of malignancy elsewhere in either breast. 3. Biopsy proven fibrocystic changes in the upper outer periareolar right breast manifested as a 9 x 7 mm mass. 4. No pathologic lymphadenopathy.     HISTORY OF PRESENTING ILLNESS:  [name] 64 y.o. female presents to our multidisciplinary breast clinic to discuss the management of her newly diagnosed breast cancer.  This was discovered by screening mammogram on 09/11/2014, which showed an area of pleomorphic calcification measuring 2 cm. She underwent a core needle biopsy on February ace 2016 which showed high-grade ductal carcinoma in situ with necrosis and calcification. Her MI on every 06/27/2015 reviewed no other additional lesions.  She feels well overall, denies any pain, dyspnea, or other symptoms. She has good appetite and energy level. No recent weight loss.  She had history of bilateral breast implants about 18-20 years ago. She had a right breast biopsy in 2013 which was negative for malignancy.  MEDICAL HISTORY:  Past Medical History  Diagnosis Date  . Allergy   . Hyperlipidemia   . Breast cancer of upper-inner quadrant of right female breast 09/15/2014  . Hot flashes     SURGICAL HISTORY: Past Surgical History  Procedure Laterality Date  . Placement of breast implants      SOCIAL HISTORY: History   Social History  . Marital Status: Married    Spouse Name: N/A  . Number of Children: N/A  . Years of Education: N/A   Occupational History  . Not on file.   Social History Main Topics  . Smoking status: Never Smoker   . Smokeless tobacco: Never Used  . Alcohol Use: Yes     Comment: occasional  . Drug Use: No  . Sexual Activity: Not on file   Other Topics Concern  . Not on file   Social History Narrative   GYN HISTORY  Menarchal: 13 or 14 LMP: 2011 Contraceptive: yes, since 1974 HRT: no G2P2  FAMILY HISTORY: Family History  Problem Relation Age of Onset  . Breast cancer Father 74  . Prostate cancer Father 26  . Throat cancer Father 72  . Pancreatic cancer Maternal Uncle 75  . Pancreatic cancer Maternal Grandmother 48  . Cancer Maternal Grandfather 58    leukemia    ALLERGIES:  has No Known Allergies.  MEDICATIONS:    Current Outpatient Prescriptions  Medication Sig Dispense Refill  . Biotin 5000 MCG CAPS Take 1,000 mcg by mouth daily.    . Lactobacillus-Inulin (CULTURELLE DIGESTIVE HEALTH PO) Take 1-2 tablets by mouth every other day.    . Multiple Vitamin (MULTIVITAMIN) tablet Take 1 tablet by mouth daily.    . polyethylene glycol (MIRALAX / GLYCOLAX) packet Take 17 g by mouth as needed. Takes 1-2  X's weekly    . sertraline (ZOLOFT) 100 MG tablet Take 100 mg by mouth daily. Takes 1-2 daily    . simvastatin (ZOCOR) 10 MG tablet Take 10 mg by mouth daily.    . clonazePAM (KLONOPIN) 0.5 MG tablet Take 0.5 mg by mouth 2 (two) times daily as needed for anxiety.     No current facility-administered medications for this visit.    REVIEW OF SYSTEMS:   Constitutional: Denies fevers, chills or abnormal night sweats Eyes: Denies blurriness of vision, double vision or watery eyes Ears, nose, mouth, throat, and face: Denies mucositis or sore throat Respiratory: Denies cough, dyspnea or wheezes Cardiovascular: Denies palpitation, chest discomfort or lower extremity swelling Gastrointestinal:  Denies nausea, heartburn or change in bowel habits Skin: Denies abnormal skin rashes Lymphatics: Denies new lymphadenopathy or easy bruising Neurological:Denies numbness, tingling or new weaknesses Behavioral/Psych: Mood is stable, no new changes  All other systems were reviewed with the patient and are negative.  PHYSICAL EXAMINATION: ECOG PERFORMANCE STATUS: 0 - Asymptomatic  Filed Vitals:   09/23/14 0834  BP: 128/65  Pulse: 66  Temp: 98.6 F (37 C)  Resp: 18   Filed Weights   09/23/14 0834  Weight: 144 lb 14.4 oz (65.726 kg)    GENERAL:alert, no distress and comfortable SKIN: skin color, texture, turgor are normal, no rashes or significant lesions EYES: normal, conjunctiva are pink and non-injected, sclera clear OROPHARYNX:no exudate, no erythema and lips, buccal mucosa, and tongue normal  NECK:  supple, thyroid normal size, non-tender, without nodularity LYMPH:  no palpable lymphadenopathy in the cervical, axillary or inguinal LUNGS: clear to auscultation and percussion with normal breathing effort HEART: regular rate & rhythm and no murmurs and no lower extremity edema ABDOMEN:abdomen soft, non-tender and normal bowel sounds Musculoskeletal:no cyanosis of digits and no clubbing  PSYCH: alert & oriented x 3 with fluent speech NEURO: no focal motor/sensory deficits Breasts: Breast inspection showed them to be symmetrical with no nipple discharge. Palpation of the breasts and axilla revealed no obvious mass that I could appreciate.   LABORATORY DATA:  I have reviewed the data as listed Lab Results  Component Value Date   WBC 7.1 09/23/2014   HGB 13.9 09/23/2014   HCT 43.2 09/23/2014   MCV 88.0 09/23/2014   PLT 270 09/23/2014    Recent Labs  09/23/14 0815  NA 145  K 4.5  CO2 27  GLUCOSE 95  BUN 22.2  CREATININE 0.8  CALCIUM 9.5  PROT 6.6  ALBUMIN 3.9  AST 19  ALT 20  ALKPHOS 88  BILITOT 0.37   PATHOLOGY REPORT Diagnosis 09/11/2014  Breast, right, needle core biopsy, 11-12 o'clock, upper outer quadrant - HIGH GRADE  DUCTAL CARCINOMA IN SITU WITH NECROSIS AND CALCIFICATIONS. SEE COMMENT. 1 of 2 FINAL for NARELLE, SCHOENING [phone]) Microscopic Comment There are very focal areas of tumor that are suspicious but not diagnostic of invasion. Although grade of tumor is best assessed at resection, with these biopsies, the in situ carcinoma is grade III. Breast prognostic studies are pending  Results: IMMUNOHISTOCHEMICAL AND MORPHOMETRIC ANALYSIS BY THE AUTOMATED CELLULAR IMAGING SYSTEM (ACIS) Estrogen Receptor: 0%, NEGATIVE Progesterone Receptor: 0%, NEGATIVE COMMENT: The negative hormone receptor study(ies) in this case have an internal positive control.   Mr Breast Bilateral W Wo Contrast 09/17/2014   IMPRESSION: 1. Non mass enhancement in the upper outer  quadrant of the right breast, biopsy proven high-grade DCIS, measuring approximately 2.6 x 1.7 x 2.2 cm  Post biopsy changes are present at the anterior margin of the enhancement. 2. No evidence of malignancy elsewhere in either breast. 3. Biopsy proven fibrocystic changes in the upper outer periareolar right breast manifested as a 9 x 7 mm mass. 4. No pathologic lymphadenopathy.  RECOMMENDATION: Treatment plan for the biopsy-proven DCIS in the upper outer right breast.  BI-RADS CATEGORY  6: Known biopsy-proven malignancy.   Electronically Signed   By: Hulan Saas M.D.   On: 09/17/2014 15:21   US Breast Ltd Uni Right Inc Axilla 09/11/2014   FINDINGS: In the right breast, an area of pleomorphic calcifications and associated architectural distortion and irregular density lies in the 1 o'clock position of the right breast. No other suspicious findings in the right breast. No evidence of axillary adenopathy on mammography.  Targeted ultrasound is performed, showing a heterogeneous area of irregular hypo echogenicity with intervening areas of increased echogenicity in the right breast at 12 o'clock, 3 cm the nipple measuring approximately 2 cm x 0.9 cm x 1.6 cm in size. No discrete suspicious mass.  IMPRESSION: Right breast calcifications and distortion suspicious for breast carcinoma. Biopsy is indicated.  RECOMMENDATION: Patient will undergo stereotactic guided core needle biopsy today.  I have discussed the findings and recommendations with the patient. Results were also provided in writing at the conclusion of the visit. If applicable, a reminder letter will be sent to the patient regarding the next appointment.  BI-RADS CATEGORY  4: Suspicious.   Electronically Signed   By: Amie Portland M.D.   On: 09/11/2014 10:59    ASSESSMENT & PLAN:  64 year-old postmenopausal woman, with past medical history of bilateral breast implants, benign right breast biopsy 3 years ago, who presented with a abnormal screening  mammogram.  1. 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 Although there is no diagnostic evidence of invasive disease, there are focal area of suspicious for early invasion of her biopsy. -She was seen by breast surgeon Dr. Luisa Hart today, and is planned to have surgery in a few weeks, likely lumpectomy. -If her surgical past reviewed no invasive breast cancer, there is no role for adjuvant chemotherapy or endocrine therapy. I did discuss in the prevention with aromatase inhibitor, although the benefit in ER/PR negative DCIS is controversial. -If her surgical past did reveal invasive component, we'll check HER-2 and likely she likely would need adjuvant chemotherapy. -She was seen by radiation oncologist Dr. Filiberto Pinks was today, and likely will have adjuvant radiation to reduce her risk of breast cancer in the future. -I discussed our survivorship clinic. She is interested. -Her father had breast cancer at age of 69,  she will be referred to see genetic counselor for genetic testing.  Follow-up: If her surgical path showed non-invasive carcinoma only, she will been seen and followed by survivorship clinic. I'll see her back only if her surgical path reviews invasive carcinoma.  All questions were answered. The patient knows to call the clinic with any problems, questions or concerns. I spent 40 minutes counseling the patient face to face. The total time spent in the appointment was 45 minutes and more than 50% was on counseling.     [name], MD 09/27/2014 12:12 PM

## 2014-09-28 ENCOUNTER — Telehealth: Payer: Self-pay | Admitting: *Deleted

## 2014-09-28 NOTE — Telephone Encounter (Signed)
Spoke with patient from [facility] 2/17.  She is doing well. She had genetic testing done on 2/18 and will wait for these results before making a surgery decision.  Encouraged her to call with any needs or concerns.

## 2014-10-07 ENCOUNTER — Encounter: Payer: Self-pay | Admitting: Genetic Counselor

## 2014-10-07 DIAGNOSIS — D0511 Intraductal carcinoma in situ of right breast: Secondary | ICD-10-CM

## 2014-10-07 DIAGNOSIS — Z803 Family history of malignant neoplasm of breast: Secondary | ICD-10-CM

## 2014-10-07 DIAGNOSIS — Z800 Family history of malignant neoplasm of digestive organs: Secondary | ICD-10-CM

## 2014-10-07 NOTE — Progress Notes (Signed)
Ms. Treloar recently had cancer genetic counseling at [facility] on 09/24/2014. At that time, it was recommended she pursue genetic testing for the BRCAPlus gene panel which looks for mutations in the following genes: BRCA1, BRCA2, PALB2, CDH1, PTEN and TP53. Her BRCAPlus gene panel, which was performed at [facility], has returned and is negative for mutations in any of these genes. These results were disclosed to her today. Per her request, reflex testing for the BreastNext gene panel at [facility] was initiated. Results for the gene panel should be available in 2-3 more weeks and we will contact her to discuss these results and recommendations warranted by these results.

## 2014-10-09 ENCOUNTER — Ambulatory Visit (INDEPENDENT_AMBULATORY_CARE_PROVIDER_SITE_OTHER): Payer: Self-pay | Admitting: Surgery

## 2014-10-09 DIAGNOSIS — D0511 Intraductal carcinoma in situ of right breast: Secondary | ICD-10-CM

## 2014-10-09 NOTE — H&P (Signed)
[name] 09/23/2014 7:44 AM Location: [facility] Patient #: [id number] DOB: 11-17-1950 Undefined / Language: [name] / Race: Undefined Female  History of Present Illness Jocelyn Moores A. Mac Dowdell MD; 09/23/2014 9:32 AM) Patient words: Pt presents to the [facility] at the request of [name] for abnormal mammogram showing pleomorphic right breast microcalcifications core bx shown to be DCIS. PT has 64 yo subpectoral implants that are saline. Denies hx of breast pain, mass or nipple discharge. FATHER HAD BREAST CANCER.         ADDITIONAL INFORMATION: PROGNOSTIC INDICATORS - ACIS Results: IMMUNOHISTOCHEMICAL AND MORPHOMETRIC ANALYSIS BY THE AUTOMATED CELLULAR IMAGING SYSTEM (ACIS) Estrogen Receptor: 0%, NEGATIVE Progesterone Receptor: 0%, NEGATIVE COMMENT: The negative hormone receptor study(ies) in this case have an internal positive control. REFERENCE RANGE ESTROGEN RECEPTOR NEGATIVE <1% POSITIVE =>1% PROGESTERONE RECEPTOR NEGATIVE <1% POSITIVE =>1% All controls stained appropriately Jocelyn Cutter MD Pathologist, Electronic Signature ( Signed 09/16/2014) FINAL DIAGNOSIS Diagnosis Breast, right, needle core biopsy, 11-12 o'clock, upper outer quadrant - HIGH GRADE DUCTAL CARCINOMA IN SITU WITH NECROSIS AND CALCIFICATIONS. SEE COMMENT. 1 of 2 FINAL for [name] [phone]) Microscopic Comment There are very focal areas of tumor that are suspicious but not diagnostic of invasion. Although grade of tumor is best assessed at resection, with these biopsies, the in situ carcinoma is grade III. Breast prognostic studies are pending and will be reported in an addendum. The case was reviewed with Jocelyn [name] who concurs. (CRR:gt, 09/14/14) [name] Pathologist, Electronic Signature (Case signed 09/14/2014) Specimen Gross and Clinical Information CLINICAL DATA: Screening detected pleomorphic calcifications in the upper outer quadrant of the right breast,  middle depth, spanning approximately 2 cm, biopsy proven high-grade DCIS, ER and PR negative. Patient has bilateral subpectoral saline implants. Prior benign biopsy of the upper outer periareolar right breast in June, 2013, pathology fibrocystic changes. Pre treatment evaluation.  LABS: BUN and creatinine were obtained on site at [place] at [address]  Results: BUN 20 mg/dL, Creatinine 0.7 mg/dL, estimated GFR 93.  EXAM: BILATERAL BREAST MRI WITH AND WITHOUT CONTRAST  TECHNIQUE: Multiplanar, multisequence MR images of both breasts were obtained prior to and following the intravenous administration of 13 ml of Multihance.  THREE-DIMENSIONAL MR IMAGE RENDERING ON INDEPENDENT WORKSTATION:  Three-dimensional MR images were rendered by post-processing of the original MR data on an independent workstation. The three-dimensional MR images were interpreted, and findings are reported in the following complete MRI report for this study. Three dimensional images were evaluated at the independent DynaCad workstation  COMPARISON: Mammography 09/11/2014, 08/18/2014. Right breast ultrasound 09/11/2014. No prior MRI.  FINDINGS: Breast composition: b. Scattered fibroglandular tissue.  Background parenchymal enhancement: Moderate.  Right breast: Non mass enhancement in the upper and slight outer right breast measuring approximately 2.6 x 1.7 x 2.2 cm consistent with the biopsy proven DCIS. Post biopsy changes at the anterior margin of this enhancement.  Enhancing mass in the upper outer right breast, anterior depth, measuring approximately 9 x 7 mm, with an adjacent biopsy clip demonstrating blooming artifact. This corresponds to the prior biopsy-proven fibrocystic changes. No mass or abnormal enhancement elsewhere in the right breast.  Left breast: No mass or abnormal enhancement.  Lymph nodes: No abnormal appearing lymph nodes.  Ancillary findings:  None.  IMPRESSION: 1. Non mass enhancement in the upper outer quadrant of the right breast, biopsy proven high-grade DCIS. Measurements are given above. Post biopsy changes are present at the anterior margin of the enhancement. 2. No  evidence of malignancy elsewhere in either breast. 3. Biopsy proven fibrocystic changes in the upper outer periareolar right breast manifested as a 9 x 7 mm mass. 4. No pathologic lymphadenopathy.  RECOMMENDATION: Treatment plan for the biopsy-proven DCIS in the upper outer right breast.  BI-RADS CATEGORY 6: Known biopsy-proven malignancy.   CLINICAL DATA: Status post stereotactic breast biopsy. Post clip images.  EXAM: DIAGNOSTIC RIGHT MAMMOGRAM POST ULTRASOUND BIOPSY  COMPARISON: Previous exam(s).  FINDINGS: Mammographic images were obtained following vacuum assisted, stereotactic guided biopsy of right breast calcifications.  IMPRESSION: Coil shaped clip lies the biopsy cavity surrounded by several residual calcifications in the upper right breast.  Final Assessment: Post Procedure Mammograms for Marker Placement   Electronically Signed By: [name] M.D. On: 09/11/2014 12:57.  The patient is a 64 year old female    Other Problems [name], [name]; 09/23/2014 7:45 AM) Hypercholesterolemia  Past Surgical History [name], [name]; 09/23/2014 7:45 AM) Breast Augmentation Bilateral. Breast Biopsy Right. Oral Surgery Tonsillectomy  Diagnostic Studies History [name], [name]; 09/23/2014 7:45 AM) Colonoscopy 5-10 years ago Mammogram within last year Pap Smear 1-5 years ago  Social History [name], [name]; 09/23/2014 7:45 AM) Alcohol use Moderate alcohol use. Caffeine use Carbonated beverages, Coffee, Tea. No drug use Tobacco use Never smoker.  Family History [name], [name]; 09/23/2014 7:45 AM) Alcohol Abuse Brother, Mother. Arthritis Family Members In General, Father,  Mother. Breast Cancer Father. Cancer Family Members In General. Colon Cancer Family Members In General. Colon Polyps Father, Mother. Depression Father. Heart Disease Family Members In General. Hypertension Father, Mother. Malignant Neoplasm Of Pancreas Family Members In General. Melanoma Father. Migraine Headache Father.  Pregnancy / Birth History Anderson Malta [place], [name]; 09/23/2014 7:45 AM) Age at menarche 89 years. Age of menopause 31-50 Contraceptive History Oral contraceptives. Gravida 2 Irregular periods Maternal age 56-30 Para 2  Review of Systems Anderson Malta [place] [name]; 09/23/2014 7:45 AM) General Not Present- Appetite Loss, Chills, Fatigue, Fever, Night Sweats, Weight Gain and Weight Loss. Skin Not Present- Change in Wart/Mole, Dryness, Hives, Jaundice, New Lesions, Non-Healing Wounds, Rash and Ulcer. HEENT Present- Hoarseness, Sore Throat and Wears glasses/contact lenses. Not Present- Earache, Hearing Loss, Nose Bleed, Oral Ulcers, Ringing in the Ears, Seasonal Allergies, Sinus Pain, Visual Disturbances and Yellow Eyes. Respiratory Present- Snoring. Not Present- Bloody sputum, Chronic Cough, Difficulty Breathing and Wheezing. Cardiovascular Not Present- Chest Pain, Difficulty Breathing Lying Down, Leg Cramps, Palpitations, Rapid Heart Rate, Shortness of Breath and Swelling of Extremities. Gastrointestinal Present- Constipation. Not Present- Abdominal Pain, Bloating, Bloody Stool, Change in Bowel Habits, Chronic diarrhea, Difficulty Swallowing, Excessive gas, Gets full quickly at meals, Hemorrhoids, Indigestion, Nausea, Rectal Pain and Vomiting. Female Genitourinary Present- Urgency. Not Present- Frequency, Nocturia, Painful Urination and Pelvic Pain. Musculoskeletal Not Present- Back Pain, Joint Pain, Joint Stiffness, Muscle Pain, Muscle Weakness and Swelling of Extremities. Neurological Not Present- Decreased Memory, Fainting, Headaches, Numbness, Seizures, Tingling,  Tremor, Trouble walking and Weakness. Psychiatric Not Present- Anxiety, Bipolar, Change in Sleep Pattern, Depression, Fearful and Frequent crying. Endocrine Present- Hot flashes. Not Present- Cold Intolerance, Excessive Hunger, Hair Changes, Heat Intolerance and New Diabetes. Hematology Present- Easy Bruising. Not Present- Excessive bleeding, Gland problems, HIV and Persistent Infections.   Physical Exam (Arshi Duarte A. Nickey Canedo MD; 09/23/2014 9:33 AM) General Mental Status-Alert. General Appearance-Consistent with stated age. Hydration-Well hydrated. Voice-Normal.  Head and Neck Head-normocephalic, atraumatic with no lesions or palpable masses. Trachea-midline. Thyroid Gland Characteristics - normal size and consistency.  Eye Eyeball - Bilateral-Extraocular movements intact. Sclera/Conjunctiva - Bilateral-No  scleral icterus.  Chest and Lung Exam Chest and lung exam reveals -quiet, even and easy respiratory effort with no use of accessory muscles and on auscultation, normal breath sounds, no adventitious sounds and normal vocal resonance. Inspection Chest Wall - Normal. Back - normal.  Breast Breast - Left-Symmetric, Non Tender, No Biopsy scars, no Dimpling, No Inflammation, No Lumpectomy scars, No Mastectomy scars, No Peau d' Orange. Breast - Right-Symmetric, Non Tender, No Biopsy scars, no Dimpling, No Inflammation, No Lumpectomy scars, No Mastectomy scars, No Peau d' Orange. Breast Lump-No Palpable Breast Mass. Note: IMPLANTS IN PLACE. SLIGHT BRUISING RIGHT BREAST UPPER OUTER QUADRANT.   Cardiovascular Cardiovascular examination reveals -normal heart sounds, regular rate and rhythm with no murmurs and normal pedal pulses bilaterally.  Abdomen Inspection Inspection of the abdomen reveals - No Hernias. Skin - Scar - no surgical scars. Palpation/Percussion Palpation and Percussion of the abdomen reveal - Soft, Non Tender, No Rebound tenderness, No  Rigidity (guarding) and No hepatosplenomegaly. Auscultation Auscultation of the abdomen reveals - Bowel sounds normal.  Neurologic Neurologic evaluation reveals -alert and oriented x 3 with no impairment of recent or remote memory. Mental Status-Normal.  Musculoskeletal Normal Exam - Left-Upper Extremity Strength Normal and Lower Extremity Strength Normal. Normal Exam - Right-Upper Extremity Strength Normal and Lower Extremity Strength Normal.  Lymphatic Head & Neck  General Head & Neck Lymphatics: Bilateral - Description - Normal. Axillary  General Axillary Region: Bilateral - Description - Normal. Tenderness - Non Tender. Femoral & Inguinal - Did not examine.    Assessment & Plan (Brihanna Devenport A. Jessen Siegman MD; 09/23/2014 9:36 AM) DCIS (DUCTAL CARCINOMA IN SITU), RIGHT (233.0  D05.11) Impression: Once genetics done can decide on surgery. pt good lumpectomy and SLN mapping candidate given central location. discussed reconstruction and mastectomy as well. Return to see me after genetics. FAMILY HISTORY OF BREAST CANCER (V16.3  Z80.3) Impression: NEEDS GENETICS GIVEN FAMILY HISTORY.     Signed by Turner Daniels, MD (09/23/2014 9:37 AM)

## 2014-10-13 ENCOUNTER — Ambulatory Visit (INDEPENDENT_AMBULATORY_CARE_PROVIDER_SITE_OTHER): Payer: Self-pay | Admitting: Surgery

## 2014-10-13 ENCOUNTER — Other Ambulatory Visit: Payer: Self-pay | Admitting: Surgery

## 2014-10-13 DIAGNOSIS — D0511 Intraductal carcinoma in situ of right breast: Secondary | ICD-10-CM

## 2014-10-14 ENCOUNTER — Encounter (HOSPITAL_BASED_OUTPATIENT_CLINIC_OR_DEPARTMENT_OTHER): Payer: Self-pay | Admitting: *Deleted

## 2014-10-14 NOTE — Progress Notes (Signed)
Labs with in 30 days and mostly normal-no htn or diabetes

## 2014-10-16 ENCOUNTER — Encounter: Payer: Self-pay | Admitting: *Deleted

## 2014-10-16 ENCOUNTER — Telehealth: Payer: Self-pay | Admitting: Hematology

## 2014-10-16 NOTE — Telephone Encounter (Signed)
per pof to sch pt appt-cld & spoke to pt and gve pt time & dtae of appt-pt understood

## 2014-10-20 ENCOUNTER — Ambulatory Visit
Admission: RE | Admit: 2014-10-20 | Discharge: 2014-10-20 | Disposition: A | Discharge status: Home / Self Care | Payer: BLUE CROSS/BLUE SHIELD | Source: Ambulatory Visit | Attending: Surgery | Admitting: Surgery

## 2014-10-20 ENCOUNTER — Ambulatory Visit (HOSPITAL_BASED_OUTPATIENT_CLINIC_OR_DEPARTMENT_OTHER): Payer: BLUE CROSS/BLUE SHIELD | Admitting: Certified Registered"

## 2014-10-20 ENCOUNTER — Encounter (HOSPITAL_BASED_OUTPATIENT_CLINIC_OR_DEPARTMENT_OTHER): Payer: Self-pay | Admitting: *Deleted

## 2014-10-20 ENCOUNTER — Encounter (HOSPITAL_COMMUNITY)
Admission: RE | Admit: 2014-10-20 | Discharge: 2014-10-20 | Disposition: A | Discharge status: Home / Self Care | Payer: BLUE CROSS/BLUE SHIELD | Source: Ambulatory Visit | Attending: Surgery | Admitting: Surgery

## 2014-10-20 ENCOUNTER — Ambulatory Visit (HOSPITAL_BASED_OUTPATIENT_CLINIC_OR_DEPARTMENT_OTHER)
Admission: RE | Admit: 2014-10-20 | Discharge: 2014-10-20 | Disposition: A | Discharge status: Home / Self Care | Payer: BLUE CROSS/BLUE SHIELD | Source: Ambulatory Visit | Attending: Surgery | Admitting: Surgery

## 2014-10-20 ENCOUNTER — Encounter (HOSPITAL_BASED_OUTPATIENT_CLINIC_OR_DEPARTMENT_OTHER)
Admission: RE | Disposition: A | Discharge status: Home / Self Care | Payer: Self-pay | Source: Ambulatory Visit | Attending: Surgery

## 2014-10-20 DIAGNOSIS — D0511 Intraductal carcinoma in situ of right breast: Secondary | ICD-10-CM | POA: Insufficient documentation

## 2014-10-20 DIAGNOSIS — E780 Pure hypercholesterolemia: Secondary | ICD-10-CM | POA: Diagnosis not present

## 2014-10-20 HISTORY — PX: BREAST LUMPECTOMY: SHX2

## 2014-10-20 SURGERY — BREAST LUMPECTOMY WITH RADIOACTIVE SEED AND SENTINEL LYMPH NODE BIOPSY
Anesthesia: Regional | Site: Breast | Laterality: Right

## 2014-10-20 MED ORDER — BUPIVACAINE-EPINEPHRINE (PF) 0.25% -1:200000 IJ SOLN
INTRAMUSCULAR | Status: DC | PRN
  Administered 2014-10-20: 11 mL via PERINEURAL

## 2014-10-20 MED ORDER — OXYCODONE HCL 5 MG PO TABS
5.0000 mg | ORAL_TABLET | Freq: Once | ORAL | Status: AC | PRN
  Administered 2014-10-20: 5 mg via ORAL

## 2014-10-20 MED ORDER — ONDANSETRON HCL 4 MG/2ML IJ SOLN
INTRAMUSCULAR | Status: DC | PRN
  Administered 2014-10-20: 4 mg via INTRAVENOUS

## 2014-10-20 MED ORDER — PROMETHAZINE HCL 25 MG/ML IJ SOLN: 6.2500 mg | INTRAMUSCULAR | Status: DC | PRN

## 2014-10-20 MED ORDER — TECHNETIUM TC 99M SULFUR COLLOID FILTERED
1.0000 | Freq: Once | INTRAVENOUS | Status: AC | PRN
  Administered 2014-10-20: 1 via INTRADERMAL

## 2014-10-20 MED ORDER — MIDAZOLAM HCL 5 MG/5ML IJ SOLN
INTRAMUSCULAR | Status: DC | PRN
  Administered 2014-10-20: 1 mg via INTRAVENOUS

## 2014-10-20 MED ORDER — DEXAMETHASONE SODIUM PHOSPHATE 4 MG/ML IJ SOLN
INTRAMUSCULAR | Status: DC | PRN
  Administered 2014-10-20: 10 mg via INTRAVENOUS

## 2014-10-20 MED ORDER — CEFAZOLIN SODIUM-DEXTROSE 2-3 GM-% IV SOLR
2.0000 g | INTRAVENOUS | Status: AC
  Administered 2014-10-20: 2 g via INTRAVENOUS

## 2014-10-20 MED ORDER — CEFAZOLIN SODIUM-DEXTROSE 2-3 GM-% IV SOLR
INTRAVENOUS | Status: AC
  Filled 2014-10-20: qty 50

## 2014-10-20 MED ORDER — FENTANYL CITRATE 0.05 MG/ML IJ SOLN
INTRAMUSCULAR | Status: AC
  Filled 2014-10-20: qty 2

## 2014-10-20 MED ORDER — KETOROLAC TROMETHAMINE 30 MG/ML IJ SOLN: 30.0000 mg | Freq: Once | INTRAMUSCULAR | Status: DC | PRN

## 2014-10-20 MED ORDER — PROPOFOL 10 MG/ML IV BOLUS
INTRAVENOUS | Status: DC | PRN
  Administered 2014-10-20: 200 mg via INTRAVENOUS

## 2014-10-20 MED ORDER — CHLORHEXIDINE GLUCONATE 4 % EX LIQD: 1.0000 "application " | Freq: Once | CUTANEOUS | Status: DC

## 2014-10-20 MED ORDER — FENTANYL CITRATE 0.05 MG/ML IJ SOLN
INTRAMUSCULAR | Status: AC
  Filled 2014-10-20: qty 6

## 2014-10-20 MED ORDER — OXYCODONE HCL 5 MG PO TABS
ORAL_TABLET | ORAL | Status: AC
  Filled 2014-10-20: qty 1

## 2014-10-20 MED ORDER — LIDOCAINE HCL (CARDIAC) 20 MG/ML IV SOLN
INTRAVENOUS | Status: DC | PRN
  Administered 2014-10-20: 30 mg via INTRAVENOUS

## 2014-10-20 MED ORDER — OXYCODONE HCL 5 MG/5ML PO SOLN: 5.0000 mg | Freq: Once | ORAL | Status: AC | PRN

## 2014-10-20 MED ORDER — BUPIVACAINE-EPINEPHRINE (PF) 0.5% -1:200000 IJ SOLN
INTRAMUSCULAR | Status: DC | PRN
  Administered 2014-10-20: 30 mL

## 2014-10-20 MED ORDER — FENTANYL CITRATE 0.05 MG/ML IJ SOLN
50.0000 ug | INTRAMUSCULAR | Status: DC | PRN
  Administered 2014-10-20: 100 ug via INTRAVENOUS

## 2014-10-20 MED ORDER — OXYCODONE-ACETAMINOPHEN 5-325 MG PO TABS: 1.0000 | ORAL_TABLET | ORAL | Status: DC | PRN

## 2014-10-20 MED ORDER — MIDAZOLAM HCL 2 MG/2ML IJ SOLN
INTRAMUSCULAR | Status: AC
  Filled 2014-10-20: qty 2

## 2014-10-20 MED ORDER — FENTANYL CITRATE 0.05 MG/ML IJ SOLN
INTRAMUSCULAR | Status: DC | PRN
  Administered 2014-10-20: 50 ug via INTRAVENOUS

## 2014-10-20 MED ORDER — MEPERIDINE HCL 25 MG/ML IJ SOLN: 6.2500 mg | INTRAMUSCULAR | Status: DC | PRN

## 2014-10-20 MED ORDER — HYDROMORPHONE HCL 1 MG/ML IJ SOLN: 0.2500 mg | INTRAMUSCULAR | Status: DC | PRN

## 2014-10-20 MED ORDER — MIDAZOLAM HCL 2 MG/2ML IJ SOLN
1.0000 mg | INTRAMUSCULAR | Status: DC | PRN
  Administered 2014-10-20: 2 mg via INTRAVENOUS

## 2014-10-20 MED ORDER — LACTATED RINGERS IV SOLN
INTRAVENOUS | Status: DC
  Administered 2014-10-20: 09:00:00 via INTRAVENOUS

## 2014-10-20 SURGICAL SUPPLY — 46 items
APPLIER CLIP 9.375 MED OPEN (MISCELLANEOUS) ×2
APR CLP MED 9.3 20 MLT OPN (MISCELLANEOUS) ×1
BINDER BREAST LRG (GAUZE/BANDAGES/DRESSINGS) IMPLANT
BINDER BREAST MEDIUM (GAUZE/BANDAGES/DRESSINGS) IMPLANT
BINDER BREAST XLRG (GAUZE/BANDAGES/DRESSINGS) IMPLANT
BINDER BREAST XXLRG (GAUZE/BANDAGES/DRESSINGS) IMPLANT
BLADE SURG 15 STRL LF DISP TIS (BLADE) ×1 IMPLANT
BLADE SURG 15 STRL SS (BLADE) ×2
CANISTER SUCT 1200ML W/VALVE (MISCELLANEOUS) ×2 IMPLANT
CHLORAPREP W/TINT 26ML (MISCELLANEOUS) ×2 IMPLANT
CLIP APPLIE 9.375 MED OPEN (MISCELLANEOUS) ×1 IMPLANT
COVER BACK TABLE 60X90IN (DRAPES) ×2 IMPLANT
COVER MAYO STAND STRL (DRAPES) ×2 IMPLANT
COVER PROBE W GEL 5X96 (DRAPES) ×2 IMPLANT
DECANTER SPIKE VIAL GLASS SM (MISCELLANEOUS) IMPLANT
DRAPE LAPAROSCOPIC ABDOMINAL (DRAPES) ×2 IMPLANT
DRAPE UTILITY XL STRL (DRAPES) ×2 IMPLANT
ELECT COATED BLADE 2.86 ST (ELECTRODE) ×2 IMPLANT
ELECT REM PT RETURN 9FT ADLT (ELECTROSURGICAL) ×2
ELECTRODE REM PT RTRN 9FT ADLT (ELECTROSURGICAL) ×1 IMPLANT
GLOVE BIO SURGEON STRL SZ7 (GLOVE) ×4 IMPLANT
GLOVE BIOGEL PI IND STRL 7.5 (GLOVE) ×1 IMPLANT
GLOVE BIOGEL PI IND STRL 8 (GLOVE) ×1 IMPLANT
GLOVE BIOGEL PI INDICATOR 7.5 (GLOVE) ×1
GLOVE BIOGEL PI INDICATOR 8 (GLOVE) ×1
GLOVE ECLIPSE 8.0 STRL XLNG CF (GLOVE) ×2 IMPLANT
GOWN STRL REUS W/ TWL LRG LVL3 (GOWN DISPOSABLE) ×2 IMPLANT
GOWN STRL REUS W/TWL LRG LVL3 (GOWN DISPOSABLE) ×4
HEMOSTAT SNOW SURGICEL 2X4 (HEMOSTASIS) ×2 IMPLANT
KIT MARKER MARGIN INK (KITS) ×2 IMPLANT
LIQUID BAND (GAUZE/BANDAGES/DRESSINGS) ×2 IMPLANT
NDL SAFETY ECLIPSE 18X1.5 (NEEDLE) IMPLANT
NEEDLE HYPO 18GX1.5 SHARP (NEEDLE)
NEEDLE HYPO 25X1 1.5 SAFETY (NEEDLE) ×2 IMPLANT
NS IRRIG 1000ML POUR BTL (IV SOLUTION) ×2 IMPLANT
PACK BASIN DAY SURGERY FS (CUSTOM PROCEDURE TRAY) ×2 IMPLANT
PENCIL BUTTON HOLSTER BLD 10FT (ELECTRODE) ×2 IMPLANT
SLEEVE SCD COMPRESS KNEE MED (MISCELLANEOUS) ×2 IMPLANT
SPONGE LAP 4X18 X RAY DECT (DISPOSABLE) ×2 IMPLANT
SUT MNCRL AB 4-0 PS2 18 (SUTURE) ×4 IMPLANT
SUT VICRYL 3-0 CR8 SH (SUTURE) ×2 IMPLANT
SYR CONTROL 10ML LL (SYRINGE) ×2 IMPLANT
TOWEL OR 17X24 6PK STRL BLUE (TOWEL DISPOSABLE) ×2 IMPLANT
TOWEL OR NON WOVEN STRL DISP B (DISPOSABLE) ×2 IMPLANT
TUBE CONNECTING 20X1/4 (TUBING) ×2 IMPLANT
YANKAUER SUCT BULB TIP NO VENT (SUCTIONS) ×2 IMPLANT

## 2014-10-20 NOTE — Transfer of Care (Signed)
Immediate Anesthesia Transfer of Care Note  Patient: [name]  Procedure(s) Performed: Procedure(s): BREAST LUMPECTOMY WITH RADIOACTIVE SEED AND SENTINEL LYMPH NODE MAPPING (Right)  Patient Location: PACU  Anesthesia Type:GA combined with regional for post-op pain  Level of Consciousness: awake, alert , oriented and patient cooperative  Airway & Oxygen Therapy: Patient Spontanous Breathing and Patient connected to face mask oxygen  Post-op Assessment: Report given to RN and Post -op Vital signs reviewed and stable  Post vital signs: Reviewed and stable  Last Vitals:  Filed Vitals:   10/20/14 1145  BP:   Pulse: 84  Temp:   Resp: 16    Complications: No apparent anesthesia complications

## 2014-10-20 NOTE — Discharge Instructions (Signed)
Post Anesthesia Home Care Instructions  Activity: Get plenty of rest for the remainder of the day. A responsible adult should stay with you for 24 hours following the procedure.  For the next 24 hours, DO NOT: -Drive a car -Paediatric nurse -Drink alcoholic beverages -Take any medication unless instructed by your physician -Make any legal decisions or sign important papers.  Meals: Start with liquid foods such as gelatin or soup. Progress to regular foods as tolerated. Avoid greasy, spicy, heavy foods. If nausea and/or vomiting occur, drink only clear liquids until the nausea and/or vomiting subsides. Call your physician if vomiting continues.  Special Instructions/Symptoms: Your throat may feel dry or sore from the anesthesia or the breathing tube placed in your throat during surgery. If this causes discomfort, gargle with warm salt water. The discomfort should disappear within 24 hours.    [place] Office Phone Number [phone]  BREAST BIOPSY/ Lumpectomy: POST OP INSTRUCTIONS  Always review your discharge instruction sheet given to you by the facility where your surgery was performed.  IF YOU HAVE DISABILITY OR FAMILY LEAVE FORMS, YOU MUST BRING THEM TO THE OFFICE FOR PROCESSING.  DO NOT GIVE THEM TO YOUR DOCTOR.  1. A prescription for pain medication may be given to you upon discharge.  Take your pain medication as prescribed, if needed.  If narcotic pain medicine is not needed, then you may take acetaminophen (Tylenol) or ibuprofen (Advil) as needed. 2. Take your usually prescribed medications unless otherwise directed 3. If you need a refill on your pain medication, please contact your pharmacy.  They will contact our office to request authorization.  Prescriptions will not be filled after 5pm or on week-ends. 4. You should eat very light the first 24 hours after surgery, such as soup, crackers, pudding, etc.  Resume your normal diet the day after  surgery. 5. Most patients will experience some swelling and bruising in the breast.  Ice packs and a good support bra will help.  Swelling and bruising can take several days to resolve.  6. It is common to experience some constipation if taking pain medication after surgery.  Increasing fluid intake and taking a stool softener will usually help or prevent this problem from occurring.  A mild laxative (Milk of Magnesia or Miralax) should be taken according to package directions if there are no bowel movements after 48 hours. 7. Unless discharge instructions indicate otherwise, you may remove your bandages 24-48 hours after surgery, and you may shower at that time.  You may have steri-strips (small skin tapes) in place directly over the incision.  These strips should be left on the skin for 7-10 days.  If your surgeon used skin glue on the incision, you may shower in 24 hours.  The glue will flake off over the next 2-3 weeks.  Any sutures or staples will be removed at the office during your follow-up visit. 8. ACTIVITIES:  You may resume regular daily activities (gradually increasing) beginning the next day.  Wearing a good support bra or sports bra minimizes pain and swelling.  You may have sexual intercourse when it is comfortable. a. You may drive when you no longer are taking prescription pain medication, you can comfortably wear a seatbelt, and you can safely maneuver your car and apply brakes. b. RETURN TO WORK:  ______________________________________________________________________________________ 9. You should see your doctor in the office for a follow-up appointment approximately two weeks after your surgery.  Your doctors nurse will typically make your follow-up appointment  when she calls you with your pathology report.  Expect your pathology report 2-3 business days after your surgery.  You may call to check if you do not hear from [place] after three days. 10. OTHER INSTRUCTIONS:  _______________________________________________________________________________________________ _____________________________________________________________________________________________________________________________________ _____________________________________________________________________________________________________________________________________ _____________________________________________________________________________________________________________________________________  WHEN TO CALL YOUR DOCTOR: 1. Fever over 101.0 2. Nausea and/or vomiting. 3. Extreme swelling or bruising. 4. Continued bleeding from incision. 5. Increased pain, redness, or drainage from the incision.  The clinic staff is available to answer your questions during regular business hours.  Please dont hesitate to call and ask to speak to one of the nurses for clinical concerns.  If you have a medical emergency, go to the nearest emergency room or call 911.  A surgeon from [facility] is always on call at the hospital.  For further questions, please visit [email or web address]    Call office in 3 days for report

## 2014-10-20 NOTE — Interval H&P Note (Signed)
History and Physical Interval Note:  10/20/2014 12:00 PM  [name]  has presented today for surgery, with the diagnosis of Right DCIS  The various methods of treatment have been discussed with the patient and family. After consideration of risks, benefits and other options for treatment, the patient has consented to  Procedure(s): BREAST LUMPECTOMY WITH RADIOACTIVE SEED AND SENTINEL LYMPH NODE MAPPING (Right) as a surgical intervention .  The patient's history has been reviewed, patient examined, no change in status, stable for surgery.  I have reviewed the patient's chart and labs.  Questions were answered to the patient's satisfaction.     Gertude Benito A.

## 2014-10-20 NOTE — H&P (View-Only) (Signed)
[name] 09/23/2014 7:44 AM Location: [facility] Patient #: [id number] DOB: 1951-01-24 Undefined / Language: Suszanne Conners / Race: Undefined Female  History of Present Illness Marcello Moores A. Lovene Maret MD; 09/23/2014 9:32 AM) Patient words: Pt presents to the [facility] at the request of Dr [name] for abnormal mammogram showing pleomorphic right breast microcalcifications core bx shown to be DCIS. PT has 64 yo subpectoral implants that are saline. Denies hx of breast pain, mass or nipple discharge. FATHER HAD BREAST CANCER.         ADDITIONAL INFORMATION: PROGNOSTIC INDICATORS - ACIS Results: IMMUNOHISTOCHEMICAL AND MORPHOMETRIC ANALYSIS BY THE AUTOMATED CELLULAR IMAGING SYSTEM (ACIS) Estrogen Receptor: 0%, NEGATIVE Progesterone Receptor: 0%, NEGATIVE COMMENT: The negative hormone receptor study(ies) in this case have an internal positive control. 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 SEE COMMENT. 1 of 2 FINAL for TRANESHA, LESSNER [phone]) Microscopic Comment There are very focal areas of tumor that are suspicious but not diagnostic of invasion. Although grade of tumor is best assessed at resection, with these biopsies, the in situ carcinoma is grade III. Breast prognostic studies are pending and will be reported in an addendum. The case was reviewed with Dr Lyndon Code who concurs. (CRR:gt, 09/14/14) [name] Pathologist, Electronic Signature (Case signed 09/14/2014) Specimen Gross and Clinical Information CLINICAL DATA: Screening detected pleomorphic calcifications in the upper outer quadrant of the right breast,  middle depth, spanning approximately 2 cm, biopsy proven high-grade DCIS, ER and PR negative. Patient has bilateral subpectoral saline implants. Prior benign biopsy of the upper outer periareolar right breast in June, 2013, pathology fibrocystic changes. Pre treatment evaluation.  LABS: BUN and creatinine were obtained on site at [facility] at [address]  Results: BUN 20 mg/dL, Creatinine 0.7 mg/dL, estimated GFR 93.  EXAM: BILATERAL BREAST MRI WITH AND WITHOUT CONTRAST  TECHNIQUE: Multiplanar, multisequence MR images of both breasts were obtained prior to and following the intravenous administration of 13 ml of Multihance.  THREE-DIMENSIONAL MR IMAGE RENDERING ON INDEPENDENT WORKSTATION:  Three-dimensional MR images were rendered by post-processing of the original MR data on an independent workstation. The three-dimensional MR images were interpreted, and findings are reported in the following complete MRI report for this study. Three dimensional images were evaluated at the independent DynaCad workstation  COMPARISON: Mammography 09/11/2014, 08/18/2014. Right breast ultrasound 09/11/2014. No prior MRI.  FINDINGS: Breast composition: b. Scattered fibroglandular tissue.  Background parenchymal enhancement: Moderate.  Right breast: Non mass enhancement in the upper and slight outer right breast measuring approximately 2.6 x 1.7 x 2.2 cm consistent with the biopsy proven DCIS. Post biopsy changes at the anterior margin of this enhancement.  Enhancing mass in the upper outer right breast, anterior depth, measuring approximately 9 x 7 mm, with an adjacent biopsy clip demonstrating blooming artifact. This corresponds to the prior biopsy-proven fibrocystic changes. No mass or abnormal enhancement elsewhere in the right breast.  Left breast: No mass or abnormal enhancement.  Lymph nodes: No abnormal appearing lymph nodes.  Ancillary findings:  None.  IMPRESSION: 1. Non mass enhancement in the upper outer quadrant of the right breast, biopsy proven high-grade DCIS. Measurements are given above. Post biopsy changes are present at the anterior margin of the enhancement. 2. No  evidence of malignancy elsewhere in either breast. 3. Biopsy proven fibrocystic changes in the upper outer periareolar right breast manifested as a 9 x 7 mm mass. 4. No pathologic lymphadenopathy.  RECOMMENDATION: Treatment plan for the biopsy-proven DCIS in the upper outer right breast.  BI-RADS CATEGORY 6: Known biopsy-proven malignancy.   CLINICAL DATA: Status post stereotactic breast biopsy. Post clip images.  EXAM: DIAGNOSTIC RIGHT MAMMOGRAM POST ULTRASOUND BIOPSY  COMPARISON: Previous exam(s).  FINDINGS: Mammographic images were obtained following vacuum assisted, stereotactic guided biopsy of right breast calcifications.  IMPRESSION: Coil shaped clip lies the biopsy cavity surrounded by several residual calcifications in the upper right breast.  Final Assessment: Post Procedure Mammograms for Marker Placement   Electronically Signed By: [name] On: 09/11/2014 12:57.  The patient is a 64 year old female    Other Problems Anderson Malta [place], [name]; 09/23/2014 7:45 AM) Hypercholesterolemia  Past Surgical History Anderson Malta [place], [name]; 09/23/2014 7:45 AM) Breast Augmentation Bilateral. Breast Biopsy Right. Oral Surgery Tonsillectomy  Diagnostic Studies History Anderson Malta [place], [name]; 09/23/2014 7:45 AM) Colonoscopy 5-10 years ago Mammogram within last year Pap Smear 1-5 years ago  Social History Anderson Malta [place], [name]; 09/23/2014 7:45 AM) Alcohol use Moderate alcohol use. Caffeine use Carbonated beverages, Coffee, Tea. No drug use Tobacco use Never smoker.  Family History Anderson Malta [place], [name]; 09/23/2014 7:45 AM) Alcohol Abuse Brother, Mother. Arthritis Family Members In General, Father,  Mother. Breast Cancer Father. Cancer Family Members In General. Colon Cancer Family Members In General. Colon Polyps Father, Mother. Depression Father. Heart Disease Family Members In General. Hypertension Father, Mother. Malignant Neoplasm Of Pancreas Family Members In General. Melanoma Father. Migraine Headache Father.  Pregnancy / Birth History Anderson Malta [place], [name]; 09/23/2014 7:45 AM) Age at menarche 74 years. Age of menopause 51-50 Contraceptive History Oral contraceptives. Gravida 2 Irregular periods Maternal age 79-30 Para 2  Review of Systems Anderson Malta [place] [name]; 09/23/2014 7:45 AM) General Not Present- Appetite Loss, Chills, Fatigue, Fever, Night Sweats, Weight Gain and Weight Loss. Skin Not Present- Change in Wart/Mole, Dryness, Hives, Jaundice, New Lesions, Non-Healing Wounds, Rash and Ulcer. HEENT Present- Hoarseness, Sore Throat and Wears glasses/contact lenses. Not Present- Earache, Hearing Loss, Nose Bleed, Oral Ulcers, Ringing in the Ears, Seasonal Allergies, Sinus Pain, Visual Disturbances and Yellow Eyes. Respiratory Present- Snoring. Not Present- Bloody sputum, Chronic Cough, Difficulty Breathing and Wheezing. Cardiovascular Not Present- Chest Pain, Difficulty Breathing Lying Down, Leg Cramps, Palpitations, Rapid Heart Rate, Shortness of Breath and Swelling of Extremities. Gastrointestinal Present- Constipation. Not Present- Abdominal Pain, Bloating, Bloody Stool, Change in Bowel Habits, Chronic diarrhea, Difficulty Swallowing, Excessive gas, Gets full quickly at meals, Hemorrhoids, Indigestion, Nausea, Rectal Pain and Vomiting. Female Genitourinary Present- Urgency. Not Present- Frequency, Nocturia, Painful Urination and Pelvic Pain. Musculoskeletal Not Present- Back Pain, Joint Pain, Joint Stiffness, Muscle Pain, Muscle Weakness and Swelling of Extremities. Neurological Not Present- Decreased Memory, Fainting, Headaches, Numbness, Seizures, Tingling,  Tremor, Trouble walking and Weakness. Psychiatric Not Present- Anxiety, Bipolar, Change in Sleep Pattern, Depression, Fearful and Frequent crying. Endocrine Present- Hot flashes. Not Present- Cold Intolerance, Excessive Hunger, Hair Changes, Heat Intolerance and New Diabetes. Hematology Present- Easy Bruising. Not Present- Excessive bleeding, Gland problems, HIV and Persistent Infections.   Physical Exam (Ashon Rosenberg A. Mikayah Joy MD; 09/23/2014 9:33 AM) General Mental Status-Alert. General Appearance-Consistent with stated age. Hydration-Well hydrated. Voice-Normal.  Head and Neck Head-normocephalic, atraumatic with no lesions or palpable masses. Trachea-midline. Thyroid Gland Characteristics - normal size and consistency.  Eye Eyeball - Bilateral-Extraocular movements intact. Sclera/Conjunctiva - Bilateral-No  scleral icterus.  Chest and Lung Exam Chest and lung exam reveals -quiet, even and easy respiratory effort with no use of accessory muscles and on auscultation, normal breath sounds, no adventitious sounds and normal vocal resonance. Inspection Chest Wall - Normal. Back - normal.  Breast Breast - Left-Symmetric, Non Tender, No Biopsy scars, no Dimpling, No Inflammation, No Lumpectomy scars, No Mastectomy scars, No Peau d' Orange. Breast - Right-Symmetric, Non Tender, No Biopsy scars, no Dimpling, No Inflammation, No Lumpectomy scars, No Mastectomy scars, No Peau d' Orange. Breast Lump-No Palpable Breast Mass. Note: IMPLANTS IN PLACE. SLIGHT BRUISING RIGHT BREAST UPPER OUTER QUADRANT.   Cardiovascular Cardiovascular examination reveals -normal heart sounds, regular rate and rhythm with no murmurs and normal pedal pulses bilaterally.  Abdomen Inspection Inspection of the abdomen reveals - No Hernias. Skin - Scar - no surgical scars. Palpation/Percussion Palpation and Percussion of the abdomen reveal - Soft, Non Tender, No Rebound tenderness, No  Rigidity (guarding) and No hepatosplenomegaly. Auscultation Auscultation of the abdomen reveals - Bowel sounds normal.  Neurologic Neurologic evaluation reveals -alert and oriented x 3 with no impairment of recent or remote memory. Mental Status-Normal.  Musculoskeletal Normal Exam - Left-Upper Extremity Strength Normal and Lower Extremity Strength Normal. Normal Exam - Right-Upper Extremity Strength Normal and Lower Extremity Strength Normal.  Lymphatic Head & Neck  General Head & Neck Lymphatics: Bilateral - Description - Normal. Axillary  General Axillary Region: Bilateral - Description - Normal. Tenderness - Non Tender. Femoral & Inguinal - Did not examine.    Assessment & Plan (Martrell Eguia A. Nakema Fake MD; 09/23/2014 9:36 AM) DCIS (DUCTAL CARCINOMA IN SITU), RIGHT (233.0  D05.11) Impression: Once genetics done can decide on surgery. pt good lumpectomy and SLN mapping candidate given central location. discussed reconstruction and mastectomy as well. Return to see me after genetics. FAMILY HISTORY OF BREAST CANCER (V16.3  Z80.3) Impression: NEEDS GENETICS GIVEN FAMILY HISTORY.     Signed by Turner Daniels, MD (09/23/2014 9:37 AM)

## 2014-10-20 NOTE — Anesthesia Preprocedure Evaluation (Addendum)
Anesthesia Evaluation  Patient identified by MRN, date of birth, ID band Patient awake    Reviewed: Allergy & Precautions, NPO status , Patient's Chart, lab work & pertinent test results  Airway Mallampati: II  TM Distance: >3 FB Neck ROM: Full    Dental no notable dental hx.    Pulmonary neg pulmonary ROS,  breath sounds clear to auscultation  Pulmonary exam normal       Cardiovascular negative cardio ROS  Rhythm:Regular Rate:Normal     Neuro/Psych negative neurological ROS  negative psych ROS   GI/Hepatic negative GI ROS, Neg liver ROS,   Endo/Other  negative endocrine ROS  Renal/GU negative Renal ROS     Musculoskeletal negative musculoskeletal ROS (+)   Abdominal   Peds  Hematology negative hematology ROS (+)   Anesthesia Other Findings   Reproductive/Obstetrics negative OB ROS                            Anesthesia Physical Anesthesia Plan  ASA: II  Anesthesia Plan: General and Regional   Post-op Pain Management:    Induction: Intravenous  Airway Management Planned: LMA  Additional Equipment: None  Intra-op Plan:   Post-operative Plan: Extubation in OR  Informed Consent: I have reviewed the patients History and Physical, chart, labs and discussed the procedure including the risks, benefits and alternatives for the proposed anesthesia with the patient or authorized representative who has indicated his/her understanding and acceptance.   Dental advisory given  Plan Discussed with: CRNA  Anesthesia Plan Comments:         Anesthesia Quick Evaluation

## 2014-10-20 NOTE — Brief Op Note (Signed)
10/20/2014  1:14 PM  PATIENT:  [name]  64 y.o. female  PRE-OPERATIVE DIAGNOSIS:  Right Ductal Carcinoma In-Situ  POST-OPERATIVE DIAGNOSIS:  Right Ductal Carcinoma In-Situ  PROCEDURE:  Procedure(s): BREAST LUMPECTOMY WITH RADIOACTIVE SEED AND SENTINEL LYMPH NODE MAPPING (Right)  SURGEON:  Surgeon(s) and Role:    * Erroll Luna, MD - Primary  PHYSICIAN ASSISTANT:   ASSISTANTS: none   ANESTHESIA:   local, regional and general  EBL:  Total I/O In: 1000 [I.V.:1000] Out: -   BLOOD ADMINISTERED:none  DRAINS: none   LOCAL MEDICATIONS USED:  BUPIVICAINE   SPECIMEN:  Source of Specimen:  right breast and axilla  DISPOSITION OF SPECIMEN:  PATHOLOGY  COUNTS:  YES  TOURNIQUET:  * No tourniquets in log *  DICTATION: .Other Dictation: Dictation Number [id number]  PLAN OF CARE: Discharge to home after PACU  PATIENT DISPOSITION:  PACU - hemodynamically stable.   Delay start of Pharmacological VTE agent (>24hrs) due to surgical blood loss or risk of bleeding: not applicable

## 2014-10-20 NOTE — Anesthesia Procedure Notes (Addendum)
Anesthesia Regional Block:  Pectoralis block  Pre-Anesthetic Checklist: ,, timeout performed, Correct Patient, Correct Site, Correct Laterality, Correct Procedure, Correct Position, site marked, Risks and benefits discussed,  Surgical consent,  Pre-op evaluation,  At surgeon's request and post-op pain management  Laterality: Right  Prep: chloraprep       Needles:  Injection technique: Single-shot  Needle Type: Stimiplex     Needle Length: 9cm 9 cm Needle Gauge: 22 and 22 G    Additional Needles:  Procedures: ultrasound guided (picture in chart) Pectoralis block Narrative:  Injection made incrementally with aspirations every 5 mL.  Performed by: Personally   Additional Notes: Good facial plane local spread. Patient tolerated the procedure well without complications   Procedure Name: LMA Insertion Date/Time: 10/20/2014 12:25 PM Performed by: Lionell Matuszak D Pre-anesthesia Checklist: Patient identified, Emergency Drugs available, Suction available and Patient being monitored Patient Re-evaluated:Patient Re-evaluated prior to inductionOxygen Delivery Method: Circle System Utilized Preoxygenation: Pre-oxygenation with 100% oxygen Intubation Type: IV induction Ventilation: Mask ventilation without difficulty LMA: LMA inserted LMA Size: 4.0 Number of attempts: 1 Airway Equipment and Method: Bite block Placement Confirmation: positive ETCO2 Tube secured with: Tape Dental Injury: Teeth and Oropharynx as per pre-operative assessment

## 2014-10-20 NOTE — Progress Notes (Signed)
Assisted Dr. Germeroth with right, ultrasound guided, pectoralis block. Side rails up, monitors on throughout procedure. See vital signs in flow sheet. Tolerated Procedure well. 

## 2014-10-20 NOTE — Anesthesia Postprocedure Evaluation (Signed)
Anesthesia Post Note  Patient: [name]  Procedure(s) Performed: Procedure(s) (LRB): BREAST LUMPECTOMY WITH RADIOACTIVE SEED AND SENTINEL LYMPH NODE MAPPING (Right)  Anesthesia type: General  Patient location: PACU  Post pain: Pain level controlled and Adequate analgesia  Post assessment: Post-op Vital signs reviewed, Patient's Cardiovascular Status Stable, Respiratory Function Stable, Patent Airway and Pain level controlled  Last Vitals:  Filed Vitals:   10/20/14 1400  BP:   Pulse: 70  Temp:   Resp: 15    Post vital signs: Reviewed and stable  Level of consciousness: awake, alert  and oriented  Complications: No apparent anesthesia complications

## 2014-10-21 NOTE — Op Note (Signed)
[name], [name]               ACCOUNT NO.:  [id number]  MEDICAL RECORD NO.:  [id number]  LOCATION:                                 FACILITY:  PHYSICIAN:  Donette Mainwaring A. 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  PROCEDURE: 1. Right breast seed localized lumpectomy. 2. Right axillary sentinel lymph node mapping.  SURGEON:  Marcello Moores A. Kery Batzel, M.D.  ANESTHESIA:  LMA with pectoral block per Anesthesia and 0.25% Sensorcaine local with epinephrine for local.  EBL:  Minimal.  SPECIMEN: 1. Right breast tissue with clip and seed in specimen. 2. One right axillary sentinel node that is hot.  DRAINS:  None.  INDICATIONS FOR PROCEDURE:  The patient is a 64 year old female found to have microcalcifications on a recent screening mammogram.  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  She had previous implants and she has implants.  We talked about lumpectomy versus mastectomy with potential reconstruction and implant exchange, which she wished to undergo lumpectomy and radiation therapy after meeting the [facility] and discussed her options.  Risk of bleeding, infection, cosmetic deformity, pain, the need for more surgery, implant injury, implant replacement, right shoulder stiffness, right shoulder numbness under the arm and around the incision, and the need for other operative procedures, vascular injury, neurologic nerve injury, and the need for other operations discussed as well as possible lymphedema.  After discussion of the above, she wished to proceed.  DESCRIPTION  OF PROCEDURE:  The patient was met in the holding area and questions were answered.  The right breast was evaluated with the Neoprobe and found to contain the clip as well as the injection of technetium sulfur colloid.  There was activity in her axilla.  Questions were answered.  She was taken back to the operating room after undergoing a pectoral block per Anesthesia.  She was placed supine on the OR table.  After induction of general endotracheal anesthesia, the upper chest region was prepped and draped in sterile fashion bilaterally.  Time-out was done to verify proper site, which was right breast and patient.  Neoprobe used, hotspot identified with the settings set too higher.  In the central to upper outer quadrant of breast, Neoprobe was used to identify the seed.  Transverse incision made and all tissue around the seed with generous gross margin excised.  I took an additional skin as the anterior margin.  The specimen was then oriented with dye.  At this point, the specimen was x-rayed, both clip and seed were in.  This was just transferred to Pathology.  Cavity was irrigated, clipped and closed with 3-0 Vicryl and 4-0 Monocryl.  Neoprobe used.  Hot spot was identified in the right axilla on technetium settings.  Transverse incision made just below the hairline on the right axilla.  Dissection was carried down and one hot node was identified in the right axilla.  Background counts approached 0.  This was a level 1 node.  This was sent to Pathology.  No further hot spots identified.  Wound closed with 3-0 Vicryl and 4-0 Monocryl.  Dermabond applied to both.  All final counts were found to be correct of sponge, needle, and instruments.  Breast binder placed.  The patient was awoke, extubated, and taken to recovery in satisfactory condition.     Kalmen Lollar A. Wessie Shanks, M.D.     TAC/MEDQ  D:  10/20/2014  T:  10/21/2014  Job:  [id number]

## 2014-10-29 ENCOUNTER — Encounter: Payer: Self-pay | Admitting: Genetic Counselor

## 2014-10-29 DIAGNOSIS — Z800 Family history of malignant neoplasm of digestive organs: Secondary | ICD-10-CM

## 2014-10-29 DIAGNOSIS — Z803 Family history of malignant neoplasm of breast: Secondary | ICD-10-CM

## 2014-10-29 DIAGNOSIS — D0511 Intraductal carcinoma in situ of right breast: Secondary | ICD-10-CM

## 2014-10-29 NOTE — Progress Notes (Signed)
[facility]  Genetic Test Results    REFERRING PROVIDER: Dr. [name]  PRIMARY PROVIDER:  Thressa Sheller, MD  PRIMARY REASON FOR VISIT:  Personal history of breast cancer  GENETIC TEST RESULT:  Testing Laboratory: [facility]  Test Ordered: BreastNext gene panel Date of Report: 10/28/2014 Result: Normal, no pathogenic mutations identified General Interpretation: Reassuring  HPI: [name] was previously seen in the [facility] due to concerns regarding a hereditary predisposition to cancer. Please refer to our prior cancer genetics clinic note for more information regarding Ms. Kasprzak's medical, social and family histories, and our assessment and recommendations, at the time. Ms. Beery genetic test results and recommendations warranted by these results were recently disclosed to her and are discussed in more detail below.  GENETIC TEST RESULTS: At the time of Ms. Cirelli's visit, we recommended she pursue genetic testing, which includes sequencing and deletion/duplication analysis of several genes associated with an increased risk for cancer via a gene panel. The BreastNext gene panel offered by [employer] includes sequencing and rearrangement analysis for the following 17 genes: ATM, BARD1, BRCA1, BRCA2, BRIP1, CDH1, CHEK2, MRE11A, MUTYH, NBN, NF1, PALB2, PTEN, RAD50, RAD51C, RAD51D, and TP53. Genetic testing for this gene panel was normal and did not reveal a pathogenic mutation in any of these genes. A copy of the genetic test report will be scanned into Epic under the media tab.   We discussed with Ms. Mcghee that current genetic testing is not perfect, and it is, therefore, possible there may be a pathogenic gene mutation in one of these genes that current testing cannot detect, but that chance is small.  We also discussed, that it is possible that another gene that has not yet been discovered, or that we have  not yet tested, is responsible for the cancer diagnoses in her family. It is, therefore, important for Ms. Tullos to continue to remain in touch with cancer genetics so that we can continue to offer Ms. Viviano the most up to date genetic testing.   CANCER SCREENING RECOMMENDATIONS: This result is reassuring and indicates that Ms. Coco likely does not have an increased risk for a future cancer due to a mutation in one of these genes. This normal test also suggests that Ms. Molter's cancer was most likely not due to an inherited predisposition associated with one of these genes.  Most cancers happen by chance and this negative test suggests that her cancer falls into this category.  We, therefore, recommended she continue to follow the cancer management and screening guidelines provided by her oncology and primary healthcare provider.   RECOMMENDATIONS FOR FAMILY MEMBERS:  While these results are reassuring for Ms. Eaddy and her children, this test does not tell us anything about Ms. Dehn's relatives' risks. We recommended these relatives also have genetic counseling and testing and are happy to help facilitate testing and/or a referral if needed. Cancer genetic counselors can also be located, by visiting the website of the [employer] ([employer]) and Field seismologist for a Oncologist by zip code.  FOLLOW-UP: Lastly, we discussed with Ms. Borrelli that cancer genetics is a rapidly advancing field and it is likely that new genetic tests will be appropriate for her and/or family members in the future. We encouraged her to remain in contact with cancer genetics on an annual basis so we can update her personal and family histories and let her know of advances in cancer  genetics that may benefit this family.   Our contact number was provided. Ms. Wivell questions were answered to her satisfaction, and she knows she is welcome to call us at anytime with additional questions  or concerns.    Catherine A. Fine, MS, CGC Certified Psychologist, [email or web address] Phone: [phone]

## 2014-11-03 ENCOUNTER — Encounter: Payer: Self-pay | Admitting: Radiation Oncology

## 2014-11-03 NOTE — Progress Notes (Signed)
Location of Breast Cancer: Right Breast Right Upper Quadrant 11-12 o'clock position  Histology per Pathology Report: Diagnosis 09/11/2014: Breast, right, needle core biopsy, 11-12 o'clock, upper outer quadrant- HIGH GRADE DUCTAL CARCINOMA IN SITU WITH NECROSIS AND CALCIFICATIONS. SEE  Receptor Status: ER( neg 0%  ), PR (  Neg 0% ), Her2-neu (   )  Did patient present with symptoms (if so, please note symptoms) or was this found on screening mammography?: screening mammogram ,hx implants 1990's([place])  Past/Anticipated interventions by surgeon, if any: Diagnosis 10/20/2014: 1. Breast, lumpectomy, right- HIGH GRADE DUCTAL CARCINOMA IN SITU WITH NECROSIS AND CALCIFICATIONS, SEECOMMENT.- IN SITU CARCINOMA IS 1 MM FROM THE NEAREST MARGIN (POSTERIOR). - PREVIOUS BIOPSY SITE IDENTIFIED.- SEE TUMOR SYNOPTIC TEMPLATE BELOW. 2. Skin , anterior margin lumpectomy site- BENIGN SKIN AND SUBCUTANEOUS SOFT TISSUE.- NEGATIVE FOR ATYPIA OR MALIGNANCY. 3. Lymph node, sentinel, biopsy, right axilla- ONE LYMPH NODE, NEGATIVE FOR TUMOR (0/1). 4. Lymph node, sentinel, biopsy, right axilla- ONE LYMPH NODE, NEGATIVE FOR TUMOR (0/1).Dr. Marcello Moores Cornett,MD  Past/Anticipated interventions by medical oncology, if any: Chemotherapy : Genetics negative, Dr.  [name] ,MD seen in breast clinic 09/27/14, next appt 11/16/14  Lymphedema issues, if any: No  Pain issues, if any:No   SAFETY ISSUES:  Prior radiation? NO  Pacemaker/ICD? NO  Possible current pregnancy? NO  Is the patient on methotrexate? NO  Current Complaints / other details:  Married, G2P2, menses age [age], 1st live pregnancy age 68,post menopausal,  Hx Oral contraceptives since 1974, no HRT non smoker, moderate alcohol use,  Father with breast cancer age 1, ,prostate and head/neck cancer,(throat).Melanoma, maternal Uncle=Pancreatic Cancer, Maternal Grandmother Pancreatic Cancer, Maternal Grandfather=Leukemia  Allergies:No known allergies    Mc[place],  Felicita Gage, RN 11/03/2014,2:47 PM

## 2014-11-05 ENCOUNTER — Ambulatory Visit
Admission: RE | Admit: 2014-11-05 | Discharge: 2014-11-05 | Disposition: A | Discharge status: Home / Self Care | Payer: BLUE CROSS/BLUE SHIELD | Source: Ambulatory Visit | Attending: Radiation Oncology | Admitting: Radiation Oncology

## 2014-11-05 ENCOUNTER — Telehealth: Payer: Self-pay | Admitting: *Deleted

## 2014-11-05 VITALS — BP 120/64 | HR 65 | Temp 97.9°F | Wt 146.4 lb

## 2014-11-05 DIAGNOSIS — Z510 Encounter for antineoplastic radiation therapy: Secondary | ICD-10-CM | POA: Diagnosis not present

## 2014-11-05 DIAGNOSIS — C50211 Malignant neoplasm of upper-inner quadrant of right female breast: Secondary | ICD-10-CM

## 2014-11-05 DIAGNOSIS — L598 Other specified disorders of the skin and subcutaneous tissue related to radiation: Secondary | ICD-10-CM | POA: Insufficient documentation

## 2014-11-05 DIAGNOSIS — D0591 Unspecified type of carcinoma in situ of right breast: Secondary | ICD-10-CM | POA: Diagnosis not present

## 2014-11-05 HISTORY — DX: Malignant neoplasm of unspecified site of unspecified female breast: C50.919

## 2014-11-05 NOTE — Addendum Note (Signed)
Encounter addended by: Norm Salt, RN on: 11/05/2014  9:57 AM<BR>     Documentation filed: Charges VN

## 2014-11-05 NOTE — Addendum Note (Signed)
Encounter addended by: Norm Salt, RN on: 11/05/2014  9:33 AM<BR>     Documentation filed: Arn Medal VN

## 2014-11-05 NOTE — Progress Notes (Signed)
Please see the Nurse Progress Note in the MD Initial Consult Encounter for this patient. 

## 2014-11-05 NOTE — Telephone Encounter (Signed)
Called patient to alter her sim appt. On 11-17-14 @ 9 am, spoke with patient and she is good with this appt.

## 2014-11-05 NOTE — Progress Notes (Signed)
    Department of Radiation Oncology  Phone:  [phone] Fax:        [phone]   Name: [name] MRN: [id number]  DOB: Mar 01, 1951  Date: 11/05/2014  Follow Up Visit Note  Diagnosis: Breast cancer of upper-inner quadrant of right female breast   Staging form: Breast, AJCC 7th Edition     Clinical stage from 09/23/2014: Stage 0 (Tis (DCIS), N0, M0) - Unsigned     Pathologic stage from 10/22/2014: Stage 0 (Tis (DCIS), N0, cM0) - Signed by [name], MD on 11/03/2014       Staging comments: Staged on final lumpectomy specimen by Dr. [name]  Interval History: [name] presents today for routine followup.  She had her lumpectomy on 3/15. This showed high grade DCIS with necrosis. No invasive disease was seen.  Two sentinel nodes were negative for metastatic disease. She had negative margins although the posterior margin was close at 1 mm. This was muscle per Dr. Brantley Stage so no additional margin will be needed.   Physical Exam:  Filed Vitals:   11/05/14 0852  BP: 120/64  Pulse: 65  Temp: 97.9 F (36.6 C)  Weight: 146 lb 6.4 oz (66.407 kg)   She has a healing incision over the right breast which has no signs of infection. Her SLN incision looks good as well. She is alert and oriented.   IMPRESSION: [name] is a 64 y.o. female s/p lumpectomy for DCIS, no ready for radiation  PLAN:  I spoke to the patient today regarding her diagnosis and options for treatment. . We discussed the role of radiation in decreasing local failures in patients who undergo lumpectomy. We discussed the process of simulation and the placement tattoos. We discussed 4-6 weeks of treatment as an outpatient. We discussed the possibility of asymptomatic lung damage. We discussed the low likelihood of secondary malignancies. We discussed the possible side effects including but not limited to skin redness, fatigue, permanent skin darkening, and breast swelling. We discussed the possibility of implant contracture requiring  further surgery. She has signed informed consent.  We will schedule her for simulation in a few weeks with plans to start at a month after her surgery.   I have ordered a pre RT mammogram to document removal of calcifications.    Thea Silversmith, MD

## 2014-11-05 NOTE — Telephone Encounter (Signed)
Called patient to inform of mammogram on 11-09-14- arrival time - 8:45 am @ [facility] and her sim on 11-10-14 @ 2 pm @ Dr. Unknown Jim [facility], spoke with patient and she is aware of these appts.

## 2014-11-09 ENCOUNTER — Encounter: Payer: Self-pay | Admitting: Hematology

## 2014-11-09 ENCOUNTER — Ambulatory Visit
Admission: RE | Admit: 2014-11-09 | Discharge: 2014-11-09 | Disposition: A | Discharge status: Home / Self Care | Payer: BLUE CROSS/BLUE SHIELD | Source: Ambulatory Visit | Attending: Radiation Oncology | Admitting: Radiation Oncology

## 2014-11-09 DIAGNOSIS — C50211 Malignant neoplasm of upper-inner quadrant of right female breast: Secondary | ICD-10-CM

## 2014-11-10 ENCOUNTER — Other Ambulatory Visit: Payer: Self-pay | Admitting: Radiation Oncology

## 2014-11-16 ENCOUNTER — Ambulatory Visit: Payer: BLUE CROSS/BLUE SHIELD | Admitting: Hematology

## 2014-11-16 ENCOUNTER — Ambulatory Visit (HOSPITAL_BASED_OUTPATIENT_CLINIC_OR_DEPARTMENT_OTHER): Payer: BLUE CROSS/BLUE SHIELD | Admitting: Hematology

## 2014-11-16 ENCOUNTER — Encounter: Payer: Self-pay | Admitting: Hematology

## 2014-11-16 VITALS — BP 141/74 | HR 71 | Temp 98.6°F | Resp 19 | Ht 61.0 in | Wt 147.9 lb

## 2014-11-16 DIAGNOSIS — Z171 Estrogen receptor negative status [ER-]: Secondary | ICD-10-CM | POA: Diagnosis not present

## 2014-11-16 DIAGNOSIS — C50211 Malignant neoplasm of upper-inner quadrant of right female breast: Secondary | ICD-10-CM

## 2014-11-16 NOTE — Progress Notes (Signed)
[place]  [phone] Fax:([phone]) [place] Note   Patient Care Team: Thressa Sheller, MD as PCP - General (Internal Medicine) Erroll Luna, MD as Consulting Physician (General Surgery) [name], MD as Consulting Physician (Hematology) Thea Silversmith, MD as Consulting Physician (Radiation Oncology) Rockwell Germany, RN as Registered Nurse Mauro Kaufmann, RN as Registered Nurse Holley Bouche, NP as Nurse Practitioner (Nurse Practitioner) 11/16/2014  CHIEF COMPLAINTS/PURPOSE OF CONSULTATION:  Newly diagnosed right breast DCIS    Breast cancer of upper-inner quadrant of right female breast   09/11/2014 Breast US In the right breast at 12 o'clock, 3 cm the nipple measuring approximately 2 cm x 0.9 cm x 1.6 cm in size. No discrete suspicious mass.   09/11/2014 Mammogram In the right breast, an area of pleomorphic calcifications and associated architectural distortion and irregular density lies in the 1 o'clock position of the right breast. No other suspicious findings in the right breast.    09/14/2014 Receptors her2 Estrogen Receptor: 0%, NEGATIVE Progesterone Receptor: 0%, NEGATIVE   09/14/2014 Initial Biopsy Breast, right, needle core biopsy, 11-12 o'clock, upper outer quadrant - HIGH GRADE DUCTAL CARCINOMA IN SITU WITH NECROSIS AND CALCIFICATIONS   09/15/2014 Initial Diagnosis Breast cancer of upper-inner quadrant of right female breast   09/17/2014 Breast MRI Non mass enhancement in the upper outer quadrant of the right breast, biopsy proven high-grade DCIS. Measurements are given above. Post biopsy changes are present at the anterior margin of the enhancement.    09/17/2014 Breast MRI No evidence of malignancy elsewhere in either breast. 3. Biopsy proven fibrocystic changes in the upper outer periareolar right breast manifested as a 9 x 7 mm mass. 4. No pathologic lymphadenopathy.   10/20/2014 Surgery Right lumpectomy. Path showed DCIS,  high-grade. Surgical margin was less than 52m.      HISTORY OF PRESENTING ILLNESS:  Jocelyn URBANEK64y.o. female presents to our multidisciplinary breast clinic to discuss the management of her newly diagnosed breast cancer.  This was discovered by screening mammogram on 09/11/2014, which showed an area of pleomorphic calcification measuring 2 cm. She underwent a core needle biopsy on February ace 2016 which showed high-grade ductal carcinoma in situ with necrosis and calcification. Her MI on every 06/27/2015 reviewed no other additional lesions.  She feels well overall, denies any pain, dyspnea, or other symptoms. She has good appetite and energy level. No recent weight loss.  She had history of bilateral breast implants about 18-20 years ago. She had a right breast biopsy in 2013 which was negative for malignancy.  INTERIM HISTORY: Ms. BLoletha Grayerreturns for follow-up. She underwent right lumpectomy on 10/20/2014, she tolerated very well without complications. She has recovered well, without significant pain or other complaints. She is scheduled to have breast radiation simulation tomorrow and start radiation soon.  MEDICAL HISTORY:  Past Medical History  Diagnosis Date  . Hyperlipidemia   . Breast cancer of upper-inner quadrant of right female breast 09/15/2014  . Hot flashes   . Breast cancer 09/11/14    right breast  . Allergy     SURGICAL HISTORY: Past Surgical History  Procedure Laterality Date  . Placement of breast implants  1995    bilat breast implants  . Colonoscopy    . Tonsillectomy    . Tubal ligation    . Dilation and curettage of uterus    . Wisdom tooth extraction      SOCIAL HISTORY: History   Social History  .  Marital Status: Married    Spouse Name: N/A  . Number of Children: 2  . Years of Education: N/A   Occupational History  . Not on file.   Social History Main Topics  . Smoking status: Never Smoker   . Smokeless tobacco: Never Used  . Alcohol  Use: Yes     Comment: occasional  . Drug Use: No  . Sexual Activity: Not on file   Other Topics Concern  . Not on file   Social History Narrative   GYN HISTORY  Menarchal: 13 or 14 LMP: 2011 Contraceptive: yes, since 1974 HRT: no G2P2    FAMILY HISTORY: Family History  Problem Relation Age of Onset  . Breast cancer Father 51  . Prostate cancer Father 3  . Throat cancer Father 83  . Cancer Father   . Pancreatic cancer Maternal Uncle 75  . Cancer Maternal Uncle   . Pancreatic cancer Maternal Grandmother 57  . Cancer Maternal Grandmother   . Cancer Maternal Grandfather 59    leukemia    ALLERGIES:  has No Known Allergies.  MEDICATIONS:  Current Outpatient Prescriptions  Medication Sig Dispense Refill  . Biotin 5000 MCG CAPS Take 1,000 mcg by mouth daily.    . clonazePAM (KLONOPIN) 0.5 MG tablet Take 0.5 mg by mouth 2 (two) times daily as needed for anxiety.    . Lactobacillus-Inulin (CULTURELLE DIGESTIVE HEALTH PO) Take 1-2 tablets by mouth every other day.    . Multiple Vitamin (MULTIVITAMIN) tablet Take 1 tablet by mouth daily.    . polyethylene glycol (MIRALAX / GLYCOLAX) packet Take 17 g by mouth as needed. Takes 1-2  X's weekly    . sertraline (ZOLOFT) 100 MG tablet Take 100 mg by mouth at bedtime. Takes 1-2 daily    . simvastatin (ZOCOR) 10 MG tablet Take 10 mg by mouth daily at 6 PM.     . oxyCODONE-acetaminophen (ROXICET) 5-325 MG per tablet Take 1-2 tablets by mouth every 4 (four) hours as needed. (Patient not taking: Reported on 11/05/2014) 30 tablet 0   No current facility-administered medications for this visit.    REVIEW OF SYSTEMS:   Constitutional: Denies fevers, chills or abnormal night sweats Eyes: Denies blurriness of vision, double vision or watery eyes Ears, nose, mouth, throat, and face: Denies mucositis or sore throat Respiratory: Denies cough, dyspnea or wheezes Cardiovascular: Denies palpitation, chest discomfort or lower extremity  swelling Gastrointestinal:  Denies nausea, heartburn or change in bowel habits Skin: Denies abnormal skin rashes Lymphatics: Denies new lymphadenopathy or easy bruising Neurological:Denies numbness, tingling or new weaknesses Behavioral/Psych: Mood is stable, no new changes  All other systems were reviewed with the patient and are negative.  PHYSICAL EXAMINATION: ECOG PERFORMANCE STATUS: 0 - Asymptomatic  Filed Vitals:   11/16/14 1355  BP: 141/74  Pulse: 71  Temp: 98.6 F (37 C)  Resp: 19   Filed Weights   11/16/14 1355  Weight: 147 lb 14.4 oz (67.087 kg)    GENERAL:alert, no distress and comfortable SKIN: skin color, texture, turgor are normal, no rashes or significant lesions EYES: normal, conjunctiva are pink and non-injected, sclera clear OROPHARYNX:no exudate, no erythema and lips, buccal mucosa, and tongue normal  NECK: supple, thyroid normal size, non-tender, without nodularity LYMPH:  no palpable lymphadenopathy in the cervical, axillary or inguinal LUNGS: clear to auscultation and percussion with normal breathing effort HEART: regular rate & rhythm and no murmurs and no lower extremity edema ABDOMEN:abdomen soft, non-tender and normal bowel sounds Musculoskeletal:no  cyanosis of digits and no clubbing  PSYCH: alert & oriented x 3 with fluent speech NEURO: no focal motor/sensory deficits Breasts: Breast inspection showed them to be symmetrical with no nipple discharge. (+) Surgical scar in the right breast and right axilla are healing well, no signs of infection. Palpation of the breasts and axilla revealed no obvious mass that I could appreciate.   LABORATORY DATA:  I have reviewed the data as listed Lab Results  Component Value Date   WBC 7.1 09/23/2014   HGB 13.9 09/23/2014   HCT 43.2 09/23/2014   MCV 88.0 09/23/2014   PLT 270 09/23/2014    Recent Labs  09/23/14 0815  NA 145  K 4.5  CO2 27  GLUCOSE 95  BUN 22.2  CREATININE 0.8  CALCIUM 9.5  PROT  6.6  ALBUMIN 3.9  AST 19  ALT 20  ALKPHOS 88  BILITOT 0.37   PATHOLOGY REPORT Diagnosis 10/20/2014  1. Breast, lumpectomy, right - HIGH GRADE DUCTAL CARCINOMA IN SITU WITH NECROSIS AND CALCIFICATIONS, SEE COMMENT. - IN SITU CARCINOMA IS 1 MM FROM THE NEAREST MARGIN (POSTERIOR). - PREVIOUS BIOPSY SITE IDENTIFIED. - SEE TUMOR SYNOPTIC TEMPLATE BELOW. 2. Skin , anterior margin lumpectomy site - BENIGN SKIN AND SUBCUTANEOUS SOFT TISSUE. - NEGATIVE FOR ATYPIA OR MALIGNANCY. 3. Lymph node, sentinel, biopsy, right axilla - ONE LYMPH NODE, NEGATIVE FOR TUMOR (0/1). 4. Lymph node, sentinel, biopsy, right axilla - ONE LYMPH NODE, NEGATIVE FOR TUMOR (0/1).  Results: (biopsy sample) IMMUNOHISTOCHEMICAL AND MORPHOMETRIC ANALYSIS BY THE AUTOMATED CELLULAR IMAGING SYSTEM (ACIS) Estrogen Receptor: 0%, NEGATIVE Progesterone Receptor: 0%, NEGATIVE COMMENT: The negative hormone receptor study(ies) in this case have an internal positive control.   Mr Breast Bilateral W Wo Contrast 09/17/2014   IMPRESSION: 1. Non mass enhancement in the upper outer quadrant of the right breast, biopsy proven high-grade DCIS, measuring approximately 2.6 x 1.7 x 2.2 cm  Post biopsy changes are present at the anterior margin of the enhancement. 2. No evidence of malignancy elsewhere in either breast. 3. Biopsy proven fibrocystic changes in the upper outer periareolar right breast manifested as a 9 x 7 mm mass. 4. No pathologic lymphadenopathy.  RECOMMENDATION: Treatment plan for the biopsy-proven DCIS in the upper outer right breast.  BI-RADS CATEGORY  6: Known biopsy-proven malignancy.   Electronically Signed   By: Evangeline Dakin M.D.   On: 09/17/2014 15:21   US Breast Ltd Uni Right Inc Axilla 09/11/2014   FINDINGS: In the right breast, an area of pleomorphic calcifications and associated architectural distortion and irregular density lies in the 1 o'clock position of the right breast. No other suspicious findings in the  right breast. No evidence of axillary adenopathy on mammography.  Targeted ultrasound is performed, showing a heterogeneous area of irregular hypo echogenicity with intervening areas of increased echogenicity in the right breast at 12 o'clock, 3 cm the nipple measuring approximately 2 cm x 0.9 cm x 1.6 cm in size. No discrete suspicious mass.  IMPRESSION: Right breast calcifications and distortion suspicious for breast carcinoma. Biopsy is indicated.  RECOMMENDATION: Patient will undergo stereotactic guided core needle biopsy today.  I have discussed the findings and recommendations with the patient. Results were also provided in writing at the conclusion of the visit. If applicable, a reminder letter will be sent to the patient regarding the next appointment.  BI-RADS CATEGORY  4: Suspicious.   Electronically Signed   By: [name]   On: 09/11/2014 10:59    ASSESSMENT &  PLAN:  64 year-old postmenopausal woman, with past medical history of bilateral breast implants, benign right breast biopsy 3 years ago, who presented with a abnormal screening mammogram.  1. 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 Although there is no diagnostic evidence of invasive disease, there are focal area of suspicious for early invasion of her biopsy. Her final surgical past can back high-grade DCIS, no invasive carcinoma. 2 lymph nodes were negative. -She is going to start adjuvant irradiation shown -Given her negative ER/PR status, there is no benefit of antiestrogen therapy. We discussed chemoprevention for breast cancer in general, and I do not recommend it given her history of ER/PR negative DCIS  -I discussed our survivorship clinic. She is interested. -We discussed the cancer surveillance plan after she completes radiation. It will be physical exam and mammogram once a year. She prefers to follow-up with her primary care  physician and gynecologist, so I will discharge her from my clinic.  2. Genetics -Her father had breast cancer at age of 65, she was referred to see genetic counselor for genetic testing  -The BRCA plus panel, which includes a BRCA1, BRCA2, CDH1, PALB2, PTEN, TP53 were all negative.  Follow-up: I'll see her on an as needed in the future. She'll follow-up with her primary care physician for cancer surveillance.   All questions were answered. The patient knows to call the clinic with any problems, questions or concerns. I spent 20 minutes counseling the patient face to face. The total time spent in the appointment was 25 minutes and more than 50% was on counseling.     [name], MD 11/16/2014 2:40 PM

## 2014-11-17 ENCOUNTER — Ambulatory Visit
Admission: RE | Admit: 2014-11-17 | Discharge: 2014-11-17 | Disposition: A | Discharge status: Home / Self Care | Payer: BLUE CROSS/BLUE SHIELD | Source: Ambulatory Visit | Attending: Radiation Oncology | Admitting: Radiation Oncology

## 2014-11-17 DIAGNOSIS — C50211 Malignant neoplasm of upper-inner quadrant of right female breast: Secondary | ICD-10-CM

## 2014-11-17 DIAGNOSIS — Z510 Encounter for antineoplastic radiation therapy: Secondary | ICD-10-CM | POA: Diagnosis not present

## 2014-11-17 NOTE — Progress Notes (Signed)
Name: [name]   MRN: [id number]  Date:  11/17/2014  DOB: January 23, 1951  Status:outpatient    DIAGNOSIS: Breast cancer.  CONSENT VERIFIED: yes   SET UP: Patient is setup supine   IMMOBILIZATION:  The following immobilization was used:Custom Moldable Pillow, breast board.   NARRATIVE: Ms. Lish was brought to the [facility].  Identity was confirmed.  All relevant records and images related to the planned course of therapy were reviewed.  Then, the patient was positioned in a stable reproducible clinical set-up for radiation therapy.  Wires were placed to delineate the clinical extent of breast tissue. A wire was placed on the scar as well.  CT images were obtained.  An isocenter was placed. Skin markings were placed.  The CT images were loaded into the planning software where the target and avoidance structures were contoured.  The radiation prescription was entered and confirmed. The patient was discharged in stable condition and tolerated simulation well.    TREATMENT PLANNING NOTE:  Treatment planning then occurred. I have requested : MLC's, isodose plan, basic dose calculation  I personally designed and supervised the construction of 3 medically necessary complex treatment devices for the protection of critical normal structures including the lungs and contralateral breast as well as the immobilization device which is necessary for set up certainty.   3D simulation occurred. I requested and analyzed a dose volume histogram of the heart, lungs and lumpectomy cavity.

## 2014-11-23 ENCOUNTER — Ambulatory Visit: Payer: BLUE CROSS/BLUE SHIELD

## 2014-11-24 ENCOUNTER — Ambulatory Visit: Payer: BLUE CROSS/BLUE SHIELD

## 2014-11-24 ENCOUNTER — Ambulatory Visit
Admission: RE | Admit: 2014-11-24 | Discharge: 2014-11-24 | Disposition: A | Discharge status: Home / Self Care | Payer: BLUE CROSS/BLUE SHIELD | Source: Ambulatory Visit | Attending: Radiation Oncology | Admitting: Radiation Oncology

## 2014-11-24 DIAGNOSIS — Z510 Encounter for antineoplastic radiation therapy: Secondary | ICD-10-CM | POA: Diagnosis not present

## 2014-11-25 ENCOUNTER — Ambulatory Visit
Admission: RE | Admit: 2014-11-25 | Discharge: 2014-11-25 | Disposition: A | Discharge status: Home / Self Care | Payer: BLUE CROSS/BLUE SHIELD | Source: Ambulatory Visit | Attending: Radiation Oncology | Admitting: Radiation Oncology

## 2014-11-25 DIAGNOSIS — Z510 Encounter for antineoplastic radiation therapy: Secondary | ICD-10-CM | POA: Diagnosis not present

## 2014-11-25 DIAGNOSIS — C50211 Malignant neoplasm of upper-inner quadrant of right female breast: Secondary | ICD-10-CM

## 2014-11-25 MED ORDER — RADIAPLEXRX EX GEL
Freq: Once | CUTANEOUS | Status: AC
  Administered 2014-11-25: 18:00:00 via TOPICAL

## 2014-11-25 MED ORDER — ALRA NON-METALLIC DEODORANT (RAD-ONC)
1.0000 "application " | Freq: Once | TOPICAL | Status: AC
  Administered 2014-11-25: 1 via TOPICAL

## 2014-11-26 ENCOUNTER — Ambulatory Visit
Admission: RE | Admit: 2014-11-26 | Discharge: 2014-11-26 | Disposition: A | Discharge status: Home / Self Care | Payer: BLUE CROSS/BLUE SHIELD | Source: Ambulatory Visit | Attending: Radiation Oncology | Admitting: Radiation Oncology

## 2014-11-26 DIAGNOSIS — Z510 Encounter for antineoplastic radiation therapy: Secondary | ICD-10-CM | POA: Diagnosis not present

## 2014-11-27 ENCOUNTER — Ambulatory Visit
Admission: RE | Admit: 2014-11-27 | Discharge: 2014-11-27 | Disposition: A | Discharge status: Home / Self Care | Payer: BLUE CROSS/BLUE SHIELD | Source: Ambulatory Visit | Attending: Radiation Oncology | Admitting: Radiation Oncology

## 2014-11-27 DIAGNOSIS — Z510 Encounter for antineoplastic radiation therapy: Secondary | ICD-10-CM | POA: Diagnosis not present

## 2014-11-30 ENCOUNTER — Ambulatory Visit
Admission: RE | Admit: 2014-11-30 | Discharge: 2014-11-30 | Disposition: A | Discharge status: Home / Self Care | Payer: BLUE CROSS/BLUE SHIELD | Source: Ambulatory Visit | Attending: Radiation Oncology | Admitting: Radiation Oncology

## 2014-11-30 DIAGNOSIS — Z510 Encounter for antineoplastic radiation therapy: Secondary | ICD-10-CM | POA: Diagnosis not present

## 2014-12-01 ENCOUNTER — Ambulatory Visit
Admission: RE | Admit: 2014-12-01 | Discharge: 2014-12-01 | Disposition: A | Discharge status: Home / Self Care | Payer: BLUE CROSS/BLUE SHIELD | Source: Ambulatory Visit | Attending: Radiation Oncology | Admitting: Radiation Oncology

## 2014-12-01 VITALS — BP 122/76 | HR 59 | Temp 98.0°F | Wt 147.8 lb

## 2014-12-01 DIAGNOSIS — Z510 Encounter for antineoplastic radiation therapy: Secondary | ICD-10-CM | POA: Diagnosis not present

## 2014-12-01 DIAGNOSIS — C50211 Malignant neoplasm of upper-inner quadrant of right female breast: Secondary | ICD-10-CM

## 2014-12-01 NOTE — Progress Notes (Signed)
Weekly Management Note Current Dose:  10 Gy  Projected Dose: 50 Gy   Narrative:  The patient presents for routine under treatment assessment.  CBCT/MVCT images/Port film x-rays were reviewed.  The chart was checked. Doing well. No complaints.   Physical Findings: Weight: 147 lb 12.8 oz (67.042 kg). Unchanged  Impression:  The patient is tolerating radiation.  Plan:  Continue treatment as planned. Continue radiaplex.

## 2014-12-02 ENCOUNTER — Telehealth: Payer: Self-pay | Admitting: *Deleted

## 2014-12-02 ENCOUNTER — Ambulatory Visit
Admission: RE | Admit: 2014-12-02 | Discharge: 2014-12-02 | Disposition: A | Discharge status: Home / Self Care | Payer: BLUE CROSS/BLUE SHIELD | Source: Ambulatory Visit | Attending: Radiation Oncology | Admitting: Radiation Oncology

## 2014-12-02 DIAGNOSIS — Z510 Encounter for antineoplastic radiation therapy: Secondary | ICD-10-CM | POA: Diagnosis not present

## 2014-12-02 NOTE — Telephone Encounter (Signed)
Spoke to pt accessing needs. Pt denies at this time. Relate "I'm doing good". Encourage pt to call with questions or concerns. Received verbal understanding.

## 2014-12-03 ENCOUNTER — Ambulatory Visit
Admission: RE | Admit: 2014-12-03 | Discharge: 2014-12-03 | Disposition: A | Discharge status: Home / Self Care | Payer: BLUE CROSS/BLUE SHIELD | Source: Ambulatory Visit | Attending: Radiation Oncology | Admitting: Radiation Oncology

## 2014-12-03 DIAGNOSIS — Z510 Encounter for antineoplastic radiation therapy: Secondary | ICD-10-CM | POA: Diagnosis not present

## 2014-12-04 ENCOUNTER — Ambulatory Visit
Admission: RE | Admit: 2014-12-04 | Discharge: 2014-12-04 | Disposition: A | Discharge status: Home / Self Care | Payer: BLUE CROSS/BLUE SHIELD | Source: Ambulatory Visit | Attending: Radiation Oncology | Admitting: Radiation Oncology

## 2014-12-04 DIAGNOSIS — Z510 Encounter for antineoplastic radiation therapy: Secondary | ICD-10-CM | POA: Diagnosis not present

## 2014-12-07 ENCOUNTER — Ambulatory Visit
Admission: RE | Admit: 2014-12-07 | Discharge: 2014-12-07 | Disposition: A | Discharge status: Home / Self Care | Payer: BLUE CROSS/BLUE SHIELD | Source: Ambulatory Visit | Attending: Radiation Oncology | Admitting: Radiation Oncology

## 2014-12-07 DIAGNOSIS — Z510 Encounter for antineoplastic radiation therapy: Secondary | ICD-10-CM | POA: Diagnosis not present

## 2014-12-08 ENCOUNTER — Ambulatory Visit
Admission: RE | Admit: 2014-12-08 | Discharge: 2014-12-08 | Disposition: A | Discharge status: Home / Self Care | Payer: BLUE CROSS/BLUE SHIELD | Source: Ambulatory Visit | Attending: Radiation Oncology | Admitting: Radiation Oncology

## 2014-12-08 ENCOUNTER — Encounter: Payer: Self-pay | Admitting: Radiation Oncology

## 2014-12-08 VITALS — BP 132/70 | HR 70 | Resp 16 | Wt 148.1 lb

## 2014-12-08 DIAGNOSIS — Z510 Encounter for antineoplastic radiation therapy: Secondary | ICD-10-CM | POA: Diagnosis not present

## 2014-12-08 DIAGNOSIS — C50211 Malignant neoplasm of upper-inner quadrant of right female breast: Secondary | ICD-10-CM

## 2014-12-08 NOTE — Progress Notes (Signed)
Weight and vitals stable. Denies pain. Denies fatigue. No skin changes of right/treated breast noted. Reports using Radiaplex bid as directed.

## 2014-12-08 NOTE — Progress Notes (Signed)
Weekly Management Note Current Dose: 20  Gy  Projected Dose: 50 Gy   Narrative:  The patient presents for routine under treatment assessment.  CBCT/MVCT images/Port film x-rays were reviewed.  The chart was checked. Doing well. No complaints. Using radiaplex.   Physical Findings: Weight: 148 lb 1.6 oz (67.178 kg). Pink right breast.   Impression:  The patient is tolerating radiation.  Plan:  Continue treatment as planned.  Continue radiaplex.

## 2014-12-09 ENCOUNTER — Ambulatory Visit
Admission: RE | Admit: 2014-12-09 | Discharge: 2014-12-09 | Disposition: A | Discharge status: Home / Self Care | Payer: BLUE CROSS/BLUE SHIELD | Source: Ambulatory Visit | Attending: Radiation Oncology | Admitting: Radiation Oncology

## 2014-12-09 DIAGNOSIS — Z510 Encounter for antineoplastic radiation therapy: Secondary | ICD-10-CM | POA: Diagnosis not present

## 2014-12-10 ENCOUNTER — Ambulatory Visit
Admission: RE | Admit: 2014-12-10 | Discharge: 2014-12-10 | Disposition: A | Discharge status: Home / Self Care | Payer: BLUE CROSS/BLUE SHIELD | Source: Ambulatory Visit | Attending: Radiation Oncology | Admitting: Radiation Oncology

## 2014-12-10 DIAGNOSIS — Z510 Encounter for antineoplastic radiation therapy: Secondary | ICD-10-CM | POA: Diagnosis not present

## 2014-12-11 ENCOUNTER — Ambulatory Visit
Admission: RE | Admit: 2014-12-11 | Discharge: 2014-12-11 | Disposition: A | Discharge status: Home / Self Care | Payer: BLUE CROSS/BLUE SHIELD | Source: Ambulatory Visit | Attending: Radiation Oncology | Admitting: Radiation Oncology

## 2014-12-11 DIAGNOSIS — Z510 Encounter for antineoplastic radiation therapy: Secondary | ICD-10-CM | POA: Diagnosis not present

## 2014-12-13 ENCOUNTER — Ambulatory Visit: Payer: BLUE CROSS/BLUE SHIELD

## 2014-12-14 ENCOUNTER — Ambulatory Visit
Admission: RE | Admit: 2014-12-14 | Discharge: 2014-12-14 | Disposition: A | Discharge status: Home / Self Care | Payer: BLUE CROSS/BLUE SHIELD | Source: Ambulatory Visit | Attending: Radiation Oncology | Admitting: Radiation Oncology

## 2014-12-14 DIAGNOSIS — Z510 Encounter for antineoplastic radiation therapy: Secondary | ICD-10-CM | POA: Diagnosis not present

## 2014-12-15 ENCOUNTER — Ambulatory Visit
Admission: RE | Admit: 2014-12-15 | Discharge: 2014-12-15 | Disposition: A | Discharge status: Home / Self Care | Payer: BLUE CROSS/BLUE SHIELD | Source: Ambulatory Visit | Attending: Radiation Oncology | Admitting: Radiation Oncology

## 2014-12-15 VITALS — BP 99/59 | HR 72 | Temp 98.7°F | Wt 148.8 lb

## 2014-12-15 DIAGNOSIS — C50211 Malignant neoplasm of upper-inner quadrant of right female breast: Secondary | ICD-10-CM

## 2014-12-15 DIAGNOSIS — Z510 Encounter for antineoplastic radiation therapy: Secondary | ICD-10-CM | POA: Diagnosis not present

## 2014-12-15 MED ORDER — SILVER SULFADIAZINE 1 % EX CREA: TOPICAL_CREAM | Freq: Two times a day (BID) | CUTANEOUS | Status: DC

## 2014-12-15 NOTE — Addendum Note (Signed)
Encounter addended by: Norm Salt, RN on: 12/15/2014  4:46 PM<BR>     Documentation filed: [employer], Orders

## 2014-12-15 NOTE — Progress Notes (Signed)
Weekly Management Note Current Dose: 30  Gy  Projected Dose:50  Gy   Narrative:  The patient presents for routine under treatment assessment.  CBCT/MVCT images/Port film x-rays were reviewed.  The chart was checked. Doing well. Minimal skin changes and no complaints.   Physical Findings: Weight: 148 lb 12.8 oz (67.495 kg). Unchanged  Impression:  The patient is tolerating radiation.  Plan:  Continue treatment as planned. Continue radiaplex.

## 2014-12-16 ENCOUNTER — Ambulatory Visit
Admission: RE | Admit: 2014-12-16 | Discharge: 2014-12-16 | Disposition: A | Discharge status: Home / Self Care | Payer: BLUE CROSS/BLUE SHIELD | Source: Ambulatory Visit | Attending: Radiation Oncology | Admitting: Radiation Oncology

## 2014-12-16 DIAGNOSIS — Z510 Encounter for antineoplastic radiation therapy: Secondary | ICD-10-CM | POA: Diagnosis not present

## 2014-12-17 ENCOUNTER — Ambulatory Visit
Admission: RE | Admit: 2014-12-17 | Discharge: 2014-12-17 | Disposition: A | Discharge status: Home / Self Care | Payer: BLUE CROSS/BLUE SHIELD | Source: Ambulatory Visit | Attending: Radiation Oncology | Admitting: Radiation Oncology

## 2014-12-17 DIAGNOSIS — Z510 Encounter for antineoplastic radiation therapy: Secondary | ICD-10-CM | POA: Diagnosis not present

## 2014-12-18 ENCOUNTER — Ambulatory Visit
Admission: RE | Admit: 2014-12-18 | Discharge: 2014-12-18 | Disposition: A | Discharge status: Home / Self Care | Payer: BLUE CROSS/BLUE SHIELD | Source: Ambulatory Visit | Attending: Radiation Oncology | Admitting: Radiation Oncology

## 2014-12-18 DIAGNOSIS — Z510 Encounter for antineoplastic radiation therapy: Secondary | ICD-10-CM | POA: Diagnosis not present

## 2014-12-21 ENCOUNTER — Ambulatory Visit
Admission: RE | Admit: 2014-12-21 | Discharge: 2014-12-21 | Disposition: A | Discharge status: Home / Self Care | Payer: BLUE CROSS/BLUE SHIELD | Source: Ambulatory Visit | Attending: Radiation Oncology | Admitting: Radiation Oncology

## 2014-12-21 DIAGNOSIS — Z510 Encounter for antineoplastic radiation therapy: Secondary | ICD-10-CM | POA: Diagnosis not present

## 2014-12-22 ENCOUNTER — Ambulatory Visit
Admission: RE | Admit: 2014-12-22 | Discharge: 2014-12-22 | Disposition: A | Discharge status: Home / Self Care | Payer: BLUE CROSS/BLUE SHIELD | Source: Ambulatory Visit | Attending: Radiation Oncology | Admitting: Radiation Oncology

## 2014-12-22 DIAGNOSIS — C50211 Malignant neoplasm of upper-inner quadrant of right female breast: Secondary | ICD-10-CM

## 2014-12-22 DIAGNOSIS — Z510 Encounter for antineoplastic radiation therapy: Secondary | ICD-10-CM | POA: Diagnosis not present

## 2014-12-22 NOTE — Progress Notes (Signed)
Weekly Management Note Current Dose: 40  Gy  Projected Dose:60  Gy   Narrative:  The patient presents for routine under treatment assessment.  CBCT/MVCT images/Port film x-rays were reviewed.  The chart was checked. Doing well. No complaints. Saw on tx machine for boost markout.   Physical Findings: Skin is pink. No desquamation.   Impression:  The patient is tolerating radiation.  Plan:  Continue treatment as planned.

## 2014-12-23 ENCOUNTER — Ambulatory Visit
Admission: RE | Admit: 2014-12-23 | Discharge: 2014-12-23 | Disposition: A | Discharge status: Home / Self Care | Payer: BLUE CROSS/BLUE SHIELD | Source: Ambulatory Visit | Attending: Radiation Oncology | Admitting: Radiation Oncology

## 2014-12-23 DIAGNOSIS — Z510 Encounter for antineoplastic radiation therapy: Secondary | ICD-10-CM | POA: Diagnosis not present

## 2014-12-24 ENCOUNTER — Ambulatory Visit
Admission: RE | Admit: 2014-12-24 | Discharge: 2014-12-24 | Disposition: A | Discharge status: Home / Self Care | Payer: BLUE CROSS/BLUE SHIELD | Source: Ambulatory Visit | Attending: Radiation Oncology | Admitting: Radiation Oncology

## 2014-12-24 DIAGNOSIS — Z510 Encounter for antineoplastic radiation therapy: Secondary | ICD-10-CM | POA: Diagnosis not present

## 2014-12-25 ENCOUNTER — Ambulatory Visit
Admission: RE | Admit: 2014-12-25 | Discharge: 2014-12-25 | Disposition: A | Discharge status: Home / Self Care | Payer: BLUE CROSS/BLUE SHIELD | Source: Ambulatory Visit | Attending: Radiation Oncology | Admitting: Radiation Oncology

## 2014-12-25 DIAGNOSIS — Z510 Encounter for antineoplastic radiation therapy: Secondary | ICD-10-CM | POA: Diagnosis not present

## 2014-12-28 ENCOUNTER — Ambulatory Visit
Admission: RE | Admit: 2014-12-28 | Discharge: 2014-12-28 | Disposition: A | Discharge status: Home / Self Care | Payer: BLUE CROSS/BLUE SHIELD | Source: Ambulatory Visit | Attending: Radiation Oncology | Admitting: Radiation Oncology

## 2014-12-28 DIAGNOSIS — Z510 Encounter for antineoplastic radiation therapy: Secondary | ICD-10-CM | POA: Diagnosis not present

## 2014-12-29 ENCOUNTER — Ambulatory Visit
Admission: RE | Admit: 2014-12-29 | Discharge: 2014-12-29 | Disposition: A | Discharge status: Home / Self Care | Payer: BLUE CROSS/BLUE SHIELD | Source: Ambulatory Visit | Attending: Radiation Oncology | Admitting: Radiation Oncology

## 2014-12-29 ENCOUNTER — Ambulatory Visit: Payer: BLUE CROSS/BLUE SHIELD

## 2014-12-29 VITALS — BP 127/72 | HR 63 | Temp 98.1°F | Wt 148.0 lb

## 2014-12-29 DIAGNOSIS — Z510 Encounter for antineoplastic radiation therapy: Secondary | ICD-10-CM | POA: Diagnosis not present

## 2014-12-29 DIAGNOSIS — C50211 Malignant neoplasm of upper-inner quadrant of right female breast: Secondary | ICD-10-CM

## 2014-12-29 DIAGNOSIS — L598 Other specified disorders of the skin and subcutaneous tissue related to radiation: Secondary | ICD-10-CM | POA: Insufficient documentation

## 2014-12-29 MED ORDER — BIAFINE EX EMUL
Freq: Two times a day (BID) | CUTANEOUS | Status: DC
  Administered 2014-12-29: 11:00:00 via TOPICAL

## 2014-12-29 MED ORDER — EMOLLIENT BASE EX CREA: TOPICAL_CREAM | CUTANEOUS | Status: DC | PRN

## 2014-12-29 NOTE — Progress Notes (Signed)
Weekly Management Note Current Dose: 50  Gy  Projected Dose: 60 Gy   Narrative:  The patient presents for routine under treatment assessment.  CBCT/MVCT images/Port film x-rays were reviewed.  The chart was checked. Doing well. Some itching over breast.   Physical Findings: Weight: 148 lb (67.132 kg). Dermatitis over breast. No moist desquamation  Impression:  The patient is tolerating radiation.  Plan:  Continue treatment as planned. Switch biafene.

## 2014-12-29 NOTE — Progress Notes (Signed)
Weekly assessment of radiation to right breast.Compleeted 25 of 30 treatments.Has follicular, itching red rash over breast.Change to biafine today.Denies pain or fatigue.

## 2014-12-30 ENCOUNTER — Ambulatory Visit
Admission: RE | Admit: 2014-12-30 | Discharge: 2014-12-30 | Disposition: A | Discharge status: Home / Self Care | Payer: BLUE CROSS/BLUE SHIELD | Source: Ambulatory Visit | Attending: Radiation Oncology | Admitting: Radiation Oncology

## 2014-12-30 DIAGNOSIS — Z510 Encounter for antineoplastic radiation therapy: Secondary | ICD-10-CM | POA: Diagnosis not present

## 2014-12-31 ENCOUNTER — Ambulatory Visit
Admission: RE | Admit: 2014-12-31 | Discharge: 2014-12-31 | Disposition: A | Discharge status: Home / Self Care | Payer: BLUE CROSS/BLUE SHIELD | Source: Ambulatory Visit | Attending: Radiation Oncology | Admitting: Radiation Oncology

## 2014-12-31 DIAGNOSIS — Z510 Encounter for antineoplastic radiation therapy: Secondary | ICD-10-CM | POA: Diagnosis not present

## 2015-01-01 ENCOUNTER — Ambulatory Visit
Admission: RE | Admit: 2015-01-01 | Discharge: 2015-01-01 | Disposition: A | Discharge status: Home / Self Care | Payer: BLUE CROSS/BLUE SHIELD | Source: Ambulatory Visit | Attending: Radiation Oncology | Admitting: Radiation Oncology

## 2015-01-01 DIAGNOSIS — Z510 Encounter for antineoplastic radiation therapy: Secondary | ICD-10-CM | POA: Diagnosis not present

## 2015-01-05 ENCOUNTER — Ambulatory Visit
Admission: RE | Admit: 2015-01-05 | Discharge: 2015-01-05 | Disposition: A | Discharge status: Home / Self Care | Payer: BLUE CROSS/BLUE SHIELD | Source: Ambulatory Visit | Attending: Radiation Oncology | Admitting: Radiation Oncology

## 2015-01-05 VITALS — BP 117/75 | HR 70 | Temp 97.9°F | Wt 147.4 lb

## 2015-01-05 DIAGNOSIS — Z510 Encounter for antineoplastic radiation therapy: Secondary | ICD-10-CM | POA: Diagnosis not present

## 2015-01-05 DIAGNOSIS — C50211 Malignant neoplasm of upper-inner quadrant of right female breast: Secondary | ICD-10-CM

## 2015-01-05 NOTE — Progress Notes (Signed)
Weekly assessment of radiation to right breast.Completed 29 of 30 treatments.Denies pain.Skin red with itching rash.Biafine has helped significantly.Continue for 2 weeks then change to lotion with vitamin e.Follow up in one month.Knows to call if any questions or concerns.

## 2015-01-05 NOTE — Progress Notes (Signed)
Weekly Management Note Current Dose: 58  Gy  Projected Dose:60  Gy   Narrative:  The patient presents for routine under treatment assessment.  CBCT/MVCT images/Port film x-rays were reviewed.  The chart was checked. Doing well. No appt with med onc (ER-)  Physical Findings: Weight: 147 lb 6.4 oz (66.86 kg). Unchanged. Dermatitis over breast.   Impression:  The patient is tolerating radiation.  Plan:  Continue treatment as planned. Discussed lotion again. Follow up in 1 month.

## 2015-01-06 ENCOUNTER — Ambulatory Visit: Payer: BLUE CROSS/BLUE SHIELD

## 2015-01-06 ENCOUNTER — Encounter: Payer: Self-pay | Admitting: Radiation Oncology

## 2015-01-06 ENCOUNTER — Ambulatory Visit
Admission: RE | Admit: 2015-01-06 | Discharge: 2015-01-06 | Disposition: A | Discharge status: Home / Self Care | Payer: BLUE CROSS/BLUE SHIELD | Source: Ambulatory Visit | Attending: Radiation Oncology | Admitting: Radiation Oncology

## 2015-01-06 DIAGNOSIS — Z510 Encounter for antineoplastic radiation therapy: Secondary | ICD-10-CM | POA: Diagnosis not present

## 2015-01-08 ENCOUNTER — Telehealth: Payer: Self-pay | Admitting: *Deleted

## 2015-01-08 NOTE — Telephone Encounter (Signed)
Spoke with patient to follow up after completion of radiation.  She is doing well and so happy to be finished.  Encouraged her to call with any needs or concerns.

## 2015-01-10 NOTE — Progress Notes (Signed)
  Radiation Oncology         [phone] ________________________________  Name: [name] MRN: [id number]  Date: 01/06/2015  DOB: 1951-05-23  End of Treatment Note  Diagnosis:   Breast cancer of upper-inner quadrant of right female breast   Staging form: Breast, AJCC 7th Edition     Clinical stage from 09/23/2014: Stage 0 (Tis (DCIS), N0, M0) - Unsigned     Pathologic stage from 10/22/2014: Stage 0 (Tis (DCIS), N0, cM0) - Signed by [name], MD on 11/03/2014       Staging comments: Staged on final lumpectomy specimen by Dr. [name]    Indication for treatment: Curative    Radiation treatment dates:   11/25/2014-01/06/2015  Site/dose:    Right breast / 50 Gray @ 2 [place] Cables per fraction x 25 fractions Right breast boost /10 Pearline Cables at 2 [place] per fraction x 5 fractions  Beams/energy:  Opposed Tangents / 6 MV photons En face / 6 and 9 MeV electrons 2  Narrative: The patient tolerated radiation treatment relatively well.   She had minimal dermatitis.   Plan: The patient has completed radiation treatment. The patient will return to radiation oncology clinic for routine followup in one month. I advised them to call or return sooner if they have any questions or concerns related to their recovery or treatment.  ------------------------------------------------  Thea Silversmith, MD

## 2015-01-27 NOTE — Progress Notes (Signed)
Name: [name]   MRN: [id number]  Date:  12/16/14   DOB: [birth year]  Status:outpatient    DIAGNOSIS: Breast cancer of upper-inner quadrant of right female breast   Staging form: Breast, AJCC 7th Edition     Clinical stage from 09/23/2014: Stage 0 (Tis (DCIS), N0, M0) - Unsigned     Pathologic stage from 10/22/2014: Stage 0 (Tis (DCIS), N0, cM0) - Signed by [name], MD on 11/03/2014       Staging comments: Staged on final lumpectomy specimen by Dr. [name]    CONSENT VERIFIED: yes   SET UP: Patient is setup supine   IMMOBILIZATION:  The following immobilization was used:Custom Moldable Pillow, breast board.   NARRATIVE: [name] underwent complex simulation and treatment planning for her boost treatment today.  Her tumor volume was outlined on the planning CT scan. The depth of her cavity was felt to be appropriate for treatment with electrons    6 and 9 MeV electrons will be prescribed to the 100%  isodose line.   I personally oversaw and approved the construction of a unique block which will be used for beam modification purposes.  An isodose plan is requested.

## 2015-02-10 ENCOUNTER — Ambulatory Visit
Admission: RE | Admit: 2015-02-10 | Discharge: 2015-02-10 | Disposition: A | Discharge status: Home / Self Care | Payer: BLUE CROSS/BLUE SHIELD | Source: Ambulatory Visit | Attending: Radiation Oncology | Admitting: Radiation Oncology

## 2015-02-10 VITALS — BP 121/106 | HR 60 | Temp 98.3°F | Wt 147.5 lb

## 2015-02-10 DIAGNOSIS — C50211 Malignant neoplasm of upper-inner quadrant of right female breast: Secondary | ICD-10-CM

## 2015-02-10 NOTE — Progress Notes (Signed)
   Department of Radiation Oncology  Phone:  [phone] Fax:        [phone]   Name: [name] MRN: [id number]  DOB: 28-Jan-1951  Date: 02/10/2015  Follow Up Visit Note  Diagnosis: Breast cancer of upper-inner quadrant of right female breast   Staging form: Breast, AJCC 7th Edition     Clinical stage from 09/23/2014: Stage 0 (Tis (DCIS), N0, M0) - Unsigned     Pathologic stage from 10/22/2014: Stage 0 (Tis (DCIS), N0, cM0) - Signed by [name], MD on 11/03/2014       Staging comments: Staged on final lumpectomy specimen by Dr. [name]  Summary and Interval since last radiation: 60 Gy to the right breast completed 01/06/2015  Interval History: [name] presents today for routine followup. [name] is doing well. She has recovered well from treatment. She was ER PR (-) so she is not on anti estrogen therapy. She has been discharged from medical oncology. She will be due for mammograms in February. She is using vitamin E cream.   Physical Exam:  Filed Vitals:   02/10/15 0833  BP: 121/106  Pulse: 60  Temp: 98.3 F (36.8 C)  Weight: 147 lb 8 oz (66.906 kg)   Excellent cosmetic response. Skin is healing well. Excellent   IMPRESSION: [name] is a 64 y.o. female s/p breast conservation currently NED.  PLAN: She is doing well with treatment. We discussed survelliance for breast cancer recurrence. I offered her follow up in our breast cancer survivorship clinic and have scheduled her with the nurse practitioner in February. She will continue her annual visits with her gynecologist as well.  We dicussed the need for annual mammograms. We discussed the use of sunscreen in the treated area and we discussed the use of massage over her breast implants to avoid capsule formation.  This document serves as a record of services personally performed by Thea Silversmith, MD. It was created on her behalf by Arlyce Harman, a trained medical scribe. The creation of this record is based on the scribe's  personal observations and the provider's statements to them. This document has been checked and approved by the attending provider.      Thea Silversmith, MD

## 2015-03-22 ENCOUNTER — Telehealth: Payer: Self-pay | Admitting: Adult Health

## 2015-03-22 NOTE — Telephone Encounter (Signed)
I spoke with [name] to schedule her Survivorship Clinic appt to see Chestine Spore, NP.  I gave her instructions on where to check in for this appt.  We look forward to participating in her care.   Mike Craze, NP [place] [phone]

## 2015-03-30 ENCOUNTER — Encounter: Payer: BLUE CROSS/BLUE SHIELD | Admitting: Nurse Practitioner

## 2015-03-31 ENCOUNTER — Telehealth: Payer: Self-pay | Admitting: Nurse Practitioner

## 2015-03-31 NOTE — Telephone Encounter (Signed)
Attempted to reach [name] to reschedule Survivorship Care Plan visit from earlier this week.  Left message for return call.

## 2015-04-05 ENCOUNTER — Encounter: Payer: Self-pay | Admitting: Nurse Practitioner

## 2015-04-05 ENCOUNTER — Other Ambulatory Visit: Payer: Self-pay | Admitting: Nurse Practitioner

## 2015-04-13 ENCOUNTER — Encounter: Payer: Self-pay | Admitting: Nurse Practitioner

## 2015-04-13 NOTE — Progress Notes (Signed)
The Survivorship Care Plan was mailed to [name] as she was unable to come in to the Survivorship Clinic for an in-person visit at this time. A letter was mailed to her outlining the purpose of the content of the care plan, as well as encouraging her to reach out to me with any questions or concerns.  My business card was included in the correspondence to the patient as well.  A copy of the care plan was also routed/faxed/mailed to [facility], MD, the patient's PCP.  I also made the patient a return visit appointment in the Survivorship Clinic for ongoing surveillance on Monday, September 13, 2015, and included that information in her letter / mailed care plan.    Kenn File, [place] [phone]

## 2015-06-03 ENCOUNTER — Other Ambulatory Visit: Payer: Self-pay

## 2015-07-12 ENCOUNTER — Other Ambulatory Visit: Payer: Self-pay | Admitting: Radiation Oncology

## 2015-07-12 ENCOUNTER — Other Ambulatory Visit: Payer: Self-pay | Admitting: Obstetrics and Gynecology

## 2015-07-12 DIAGNOSIS — C50011 Malignant neoplasm of nipple and areola, right female breast: Secondary | ICD-10-CM

## 2015-07-12 DIAGNOSIS — C801 Malignant (primary) neoplasm, unspecified: Secondary | ICD-10-CM

## 2015-09-13 ENCOUNTER — Encounter: Payer: BLUE CROSS/BLUE SHIELD | Admitting: Nurse Practitioner

## 2015-09-14 ENCOUNTER — Telehealth: Payer: Self-pay | Admitting: *Deleted

## 2015-09-14 NOTE — Telephone Encounter (Signed)
LM for pt to rtn call to reschedule [facility] appt. POF sent to scheduling also.

## 2015-09-15 ENCOUNTER — Telehealth: Payer: Self-pay | Admitting: Hematology

## 2015-09-15 NOTE — Telephone Encounter (Signed)
Called patient and she request not to reschedule survivorship at this time

## 2015-10-04 ENCOUNTER — Other Ambulatory Visit: Payer: Self-pay | Admitting: Radiation Oncology

## 2015-10-04 ENCOUNTER — Ambulatory Visit
Admission: RE | Admit: 2015-10-04 | Discharge: 2015-10-04 | Disposition: A | Discharge status: Home / Self Care | Payer: BLUE CROSS/BLUE SHIELD | Source: Ambulatory Visit | Attending: Radiation Oncology | Admitting: Radiation Oncology

## 2015-10-04 DIAGNOSIS — C801 Malignant (primary) neoplasm, unspecified: Secondary | ICD-10-CM

## 2015-10-12 ENCOUNTER — Other Ambulatory Visit: Payer: Self-pay | Admitting: Orthopedic Surgery

## 2015-10-12 DIAGNOSIS — M545 Low back pain: Secondary | ICD-10-CM

## 2015-10-12 DIAGNOSIS — M25562 Pain in left knee: Secondary | ICD-10-CM

## 2015-10-21 ENCOUNTER — Other Ambulatory Visit: Payer: BLUE CROSS/BLUE SHIELD

## 2015-11-07 ENCOUNTER — Ambulatory Visit
Admission: RE | Admit: 2015-11-07 | Discharge: 2015-11-07 | Disposition: A | Discharge status: Home / Self Care | Payer: BLUE CROSS/BLUE SHIELD | Source: Ambulatory Visit | Attending: Orthopedic Surgery | Admitting: Orthopedic Surgery

## 2015-11-07 DIAGNOSIS — M25562 Pain in left knee: Secondary | ICD-10-CM

## 2015-11-08 ENCOUNTER — Ambulatory Visit
Admission: RE | Admit: 2015-11-08 | Discharge: 2015-11-08 | Disposition: A | Discharge status: Home / Self Care | Payer: BLUE CROSS/BLUE SHIELD | Source: Ambulatory Visit | Attending: Orthopedic Surgery | Admitting: Orthopedic Surgery

## 2015-11-08 DIAGNOSIS — M545 Low back pain: Secondary | ICD-10-CM

## 2015-11-11 IMAGING — MG MM DIAG BREAST TOMO UNI RIGHT
7 series · 8 of 15 positions shown · non-contrast
Comparison: Prior exams

CLINICAL DATA: Followup for exam after right breast lumpectomy for
breast carcinoma. Patient is due to begin her radiation therapy. No
current complaints.

EXAM:
DIGITAL DIAGNOSTIC RIGHT MAMMOGRAM WITH 3D TOMOSYNTHESIS AND CAD
The patient has retroglandular implants. Standard and implant
displaced views were performed.

[R CC (1 of 2)]
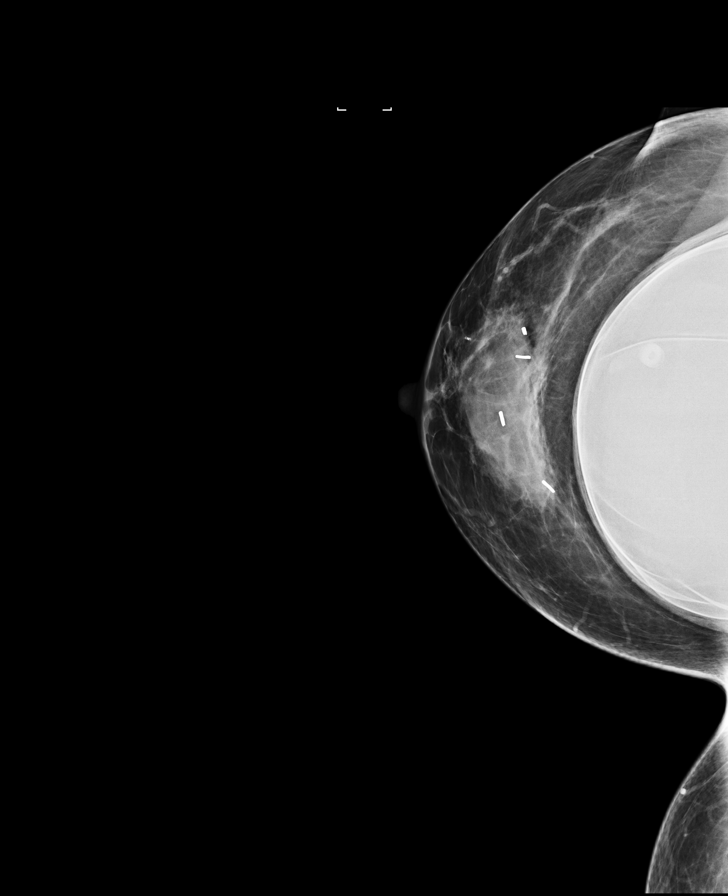

[R MLO (1 of 2)]
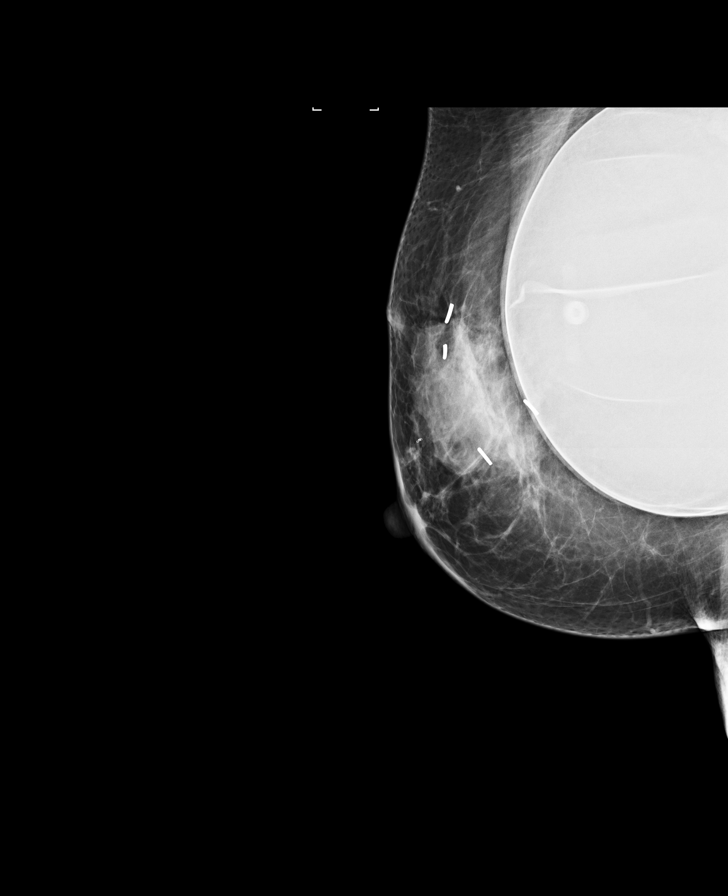

[R TAN]
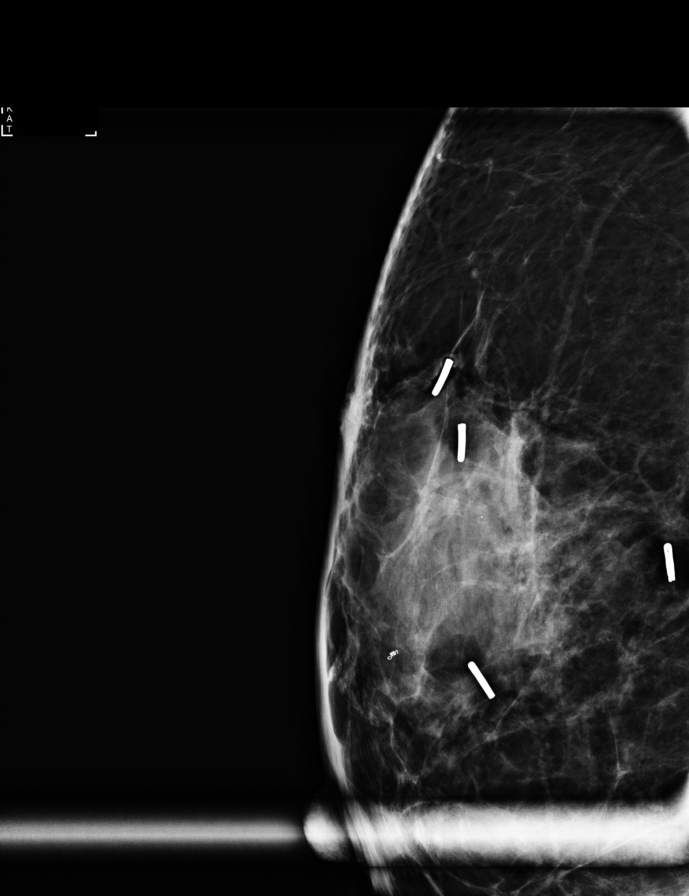

[R MLO (2 of 2)]
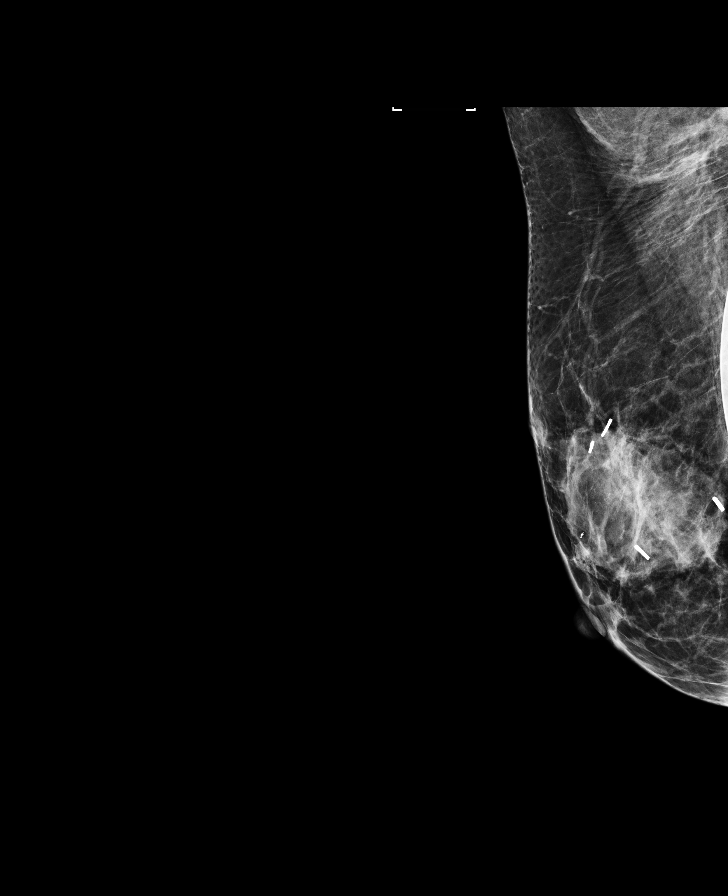

[R CC (2 of 2)]
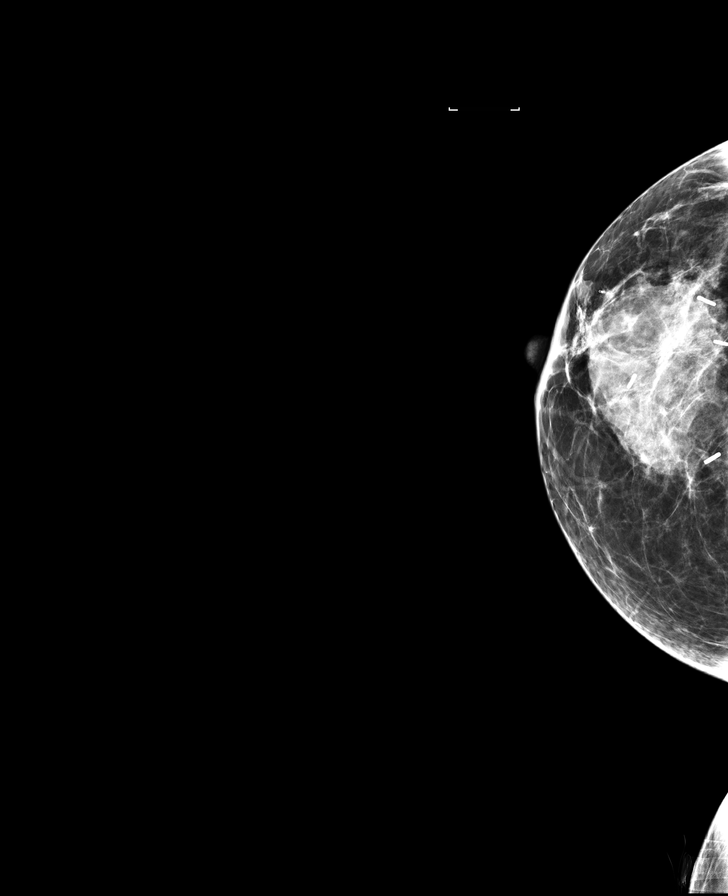

[R MLO tomo · 2 of 65 frames shown]
[frame 21/65]
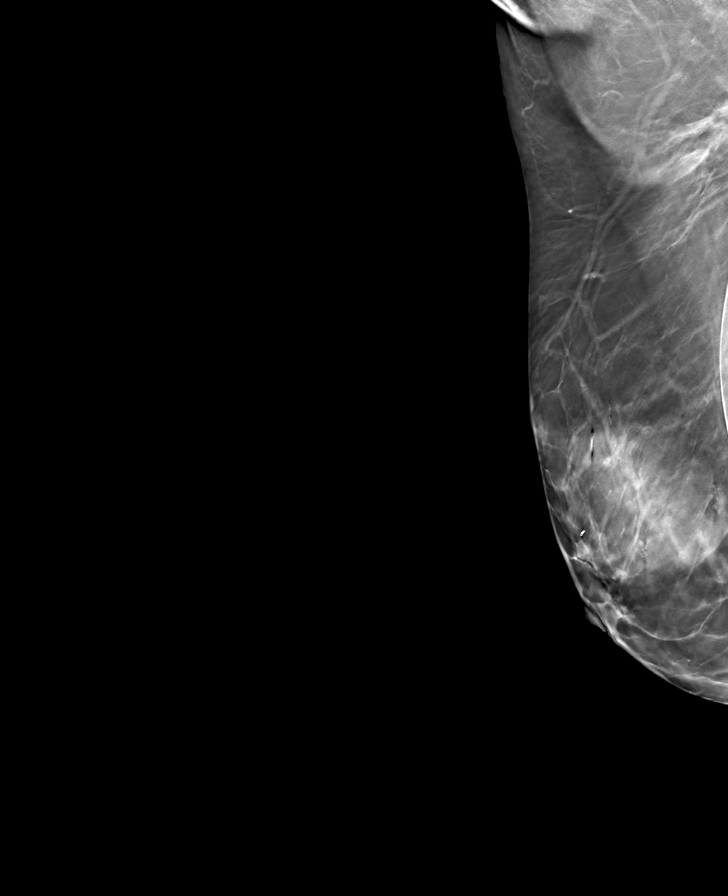
[frame 33/65]
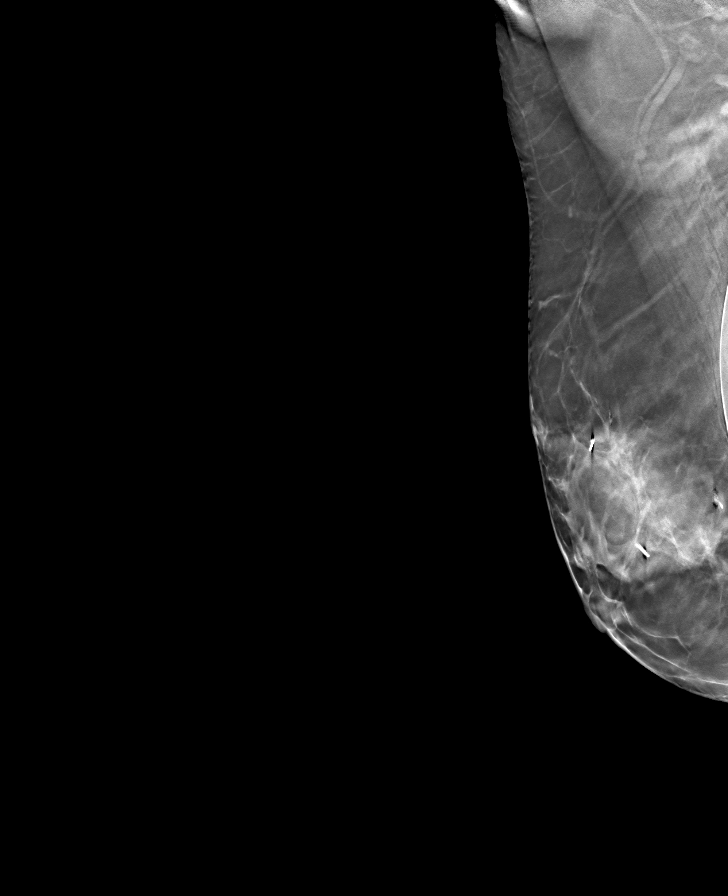

[R CC tomo · tomo slice 28/55.0]
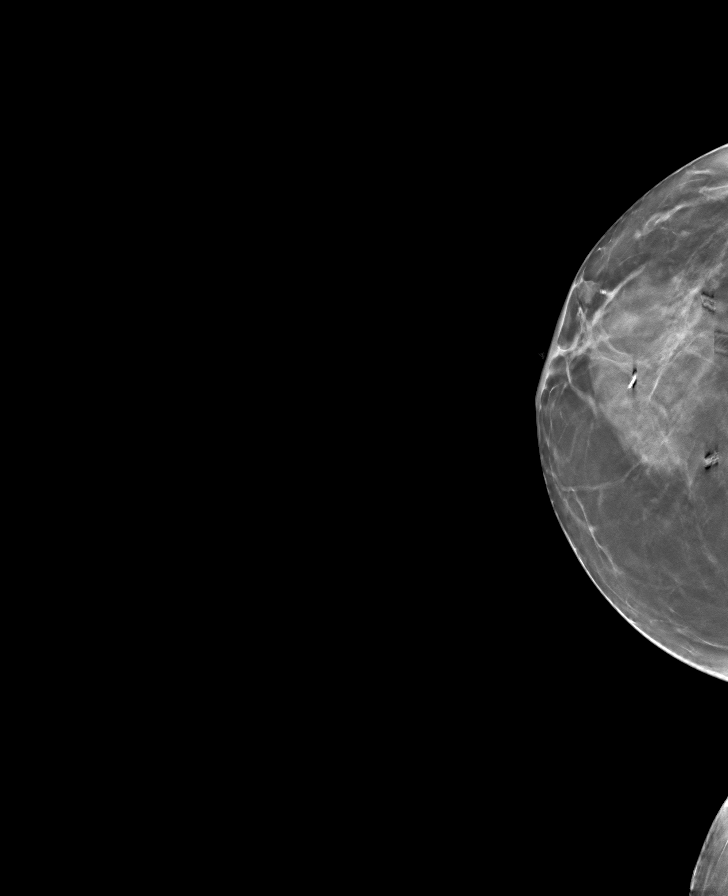

[8 of 15 positions shown; findings below may reference images not displayed]

ACR Breast Density Category c: The breast tissue is heterogeneously
dense, which may obscure small masses.
FINDINGS: There is residual density throughout the lumpectomy site consistent
with postoperative edema/change. There are no masses or residual
calcifications. No evidence of residual or new breast carcinoma.
Retro glandular saline implant is intact.

Mammographic images were processed with CAD.
IMPRESSION: Benign postsurgical changes on the right. No evidence of residual or
new breast carcinoma.

RECOMMENDATION:
Radiation therapy as planned.

Diagnostic mammography in August 2015 per normal post lumpectomy
protocol.

I have discussed the findings and recommendations with the patient.
Results were also provided in writing at the conclusion of the
visit. If applicable, a reminder letter will be sent to the patient
regarding the next appointment.

BI-RADS CATEGORY  2: Benign.

## 2015-11-22 ENCOUNTER — Encounter (INDEPENDENT_AMBULATORY_CARE_PROVIDER_SITE_OTHER): Payer: Self-pay | Admitting: Orthopedic Surgery

## 2016-09-07 ENCOUNTER — Other Ambulatory Visit: Payer: Self-pay | Admitting: Obstetrics and Gynecology

## 2016-09-07 DIAGNOSIS — Z853 Personal history of malignant neoplasm of breast: Secondary | ICD-10-CM

## 2016-10-04 ENCOUNTER — Ambulatory Visit
Admission: RE | Admit: 2016-10-04 | Discharge: 2016-10-04 | Disposition: A | Discharge status: Home / Self Care | Payer: BLUE CROSS/BLUE SHIELD | Source: Ambulatory Visit | Attending: Obstetrics and Gynecology | Admitting: Obstetrics and Gynecology

## 2016-10-04 DIAGNOSIS — Z853 Personal history of malignant neoplasm of breast: Secondary | ICD-10-CM

## 2017-08-15 DIAGNOSIS — Z23 Encounter for immunization: Secondary | ICD-10-CM | POA: Diagnosis not present

## 2017-08-15 DIAGNOSIS — D225 Melanocytic nevi of trunk: Secondary | ICD-10-CM | POA: Diagnosis not present

## 2017-08-15 DIAGNOSIS — L814 Other melanin hyperpigmentation: Secondary | ICD-10-CM | POA: Diagnosis not present

## 2017-08-15 DIAGNOSIS — Z808 Family history of malignant neoplasm of other organs or systems: Secondary | ICD-10-CM | POA: Diagnosis not present

## 2017-08-15 DIAGNOSIS — Z86018 Personal history of other benign neoplasm: Secondary | ICD-10-CM | POA: Diagnosis not present

## 2017-08-15 DIAGNOSIS — L820 Inflamed seborrheic keratosis: Secondary | ICD-10-CM | POA: Diagnosis not present

## 2017-08-15 DIAGNOSIS — Z85828 Personal history of other malignant neoplasm of skin: Secondary | ICD-10-CM | POA: Diagnosis not present

## 2017-08-15 DIAGNOSIS — D1801 Hemangioma of skin and subcutaneous tissue: Secondary | ICD-10-CM | POA: Diagnosis not present

## 2017-08-15 DIAGNOSIS — L821 Other seborrheic keratosis: Secondary | ICD-10-CM | POA: Diagnosis not present

## 2017-09-05 ENCOUNTER — Other Ambulatory Visit: Payer: Self-pay | Admitting: Obstetrics and Gynecology

## 2017-09-05 DIAGNOSIS — Z853 Personal history of malignant neoplasm of breast: Secondary | ICD-10-CM

## 2017-09-06 DIAGNOSIS — E559 Vitamin D deficiency, unspecified: Secondary | ICD-10-CM | POA: Diagnosis not present

## 2017-09-06 DIAGNOSIS — E785 Hyperlipidemia, unspecified: Secondary | ICD-10-CM | POA: Diagnosis not present

## 2017-09-13 DIAGNOSIS — F339 Major depressive disorder, recurrent, unspecified: Secondary | ICD-10-CM | POA: Diagnosis not present

## 2017-09-13 DIAGNOSIS — E559 Vitamin D deficiency, unspecified: Secondary | ICD-10-CM | POA: Diagnosis not present

## 2017-09-13 DIAGNOSIS — E785 Hyperlipidemia, unspecified: Secondary | ICD-10-CM | POA: Diagnosis not present

## 2017-10-08 ENCOUNTER — Ambulatory Visit
Admission: RE | Admit: 2017-10-08 | Discharge: 2017-10-08 | Disposition: A | Discharge status: Home / Self Care | Payer: PPO | Source: Ambulatory Visit | Attending: Obstetrics and Gynecology | Admitting: Obstetrics and Gynecology

## 2017-10-08 DIAGNOSIS — R928 Other abnormal and inconclusive findings on diagnostic imaging of breast: Secondary | ICD-10-CM | POA: Diagnosis not present

## 2017-10-08 DIAGNOSIS — Z853 Personal history of malignant neoplasm of breast: Secondary | ICD-10-CM

## 2017-10-08 HISTORY — DX: Personal history of irradiation: Z92.3

## 2017-10-30 DIAGNOSIS — J019 Acute sinusitis, unspecified: Secondary | ICD-10-CM | POA: Diagnosis not present

## 2017-12-26 ENCOUNTER — Encounter: Payer: Self-pay | Admitting: Internal Medicine

## 2017-12-28 DIAGNOSIS — J069 Acute upper respiratory infection, unspecified: Secondary | ICD-10-CM | POA: Diagnosis not present

## 2018-01-04 ENCOUNTER — Encounter: Payer: Self-pay | Admitting: Internal Medicine

## 2018-03-06 ENCOUNTER — Ambulatory Visit (AMBULATORY_SURGERY_CENTER): Payer: Self-pay | Admitting: *Deleted

## 2018-03-06 VITALS — Ht 60.0 in | Wt 152.2 lb

## 2018-03-06 DIAGNOSIS — Z1211 Encounter for screening for malignant neoplasm of colon: Secondary | ICD-10-CM

## 2018-03-06 MED ORDER — PLENVU 140 G PO SOLR: 1.0000 | Freq: Once | ORAL | 0 refills | Status: AC

## 2018-03-06 NOTE — Progress Notes (Signed)
No egg or soy allergy known to patient  No issues with past sedation with any surgeries  or procedures, no intubation problems  No diet pills per patient No home 02 use per patient  No blood thinners per patient  Pt denies issues with constipation  No A fib or A flutter  EMMI video offered patient declined

## 2018-03-11 DIAGNOSIS — E785 Hyperlipidemia, unspecified: Secondary | ICD-10-CM | POA: Diagnosis not present

## 2018-03-11 DIAGNOSIS — E559 Vitamin D deficiency, unspecified: Secondary | ICD-10-CM | POA: Diagnosis not present

## 2018-03-11 DIAGNOSIS — Z0000 Encounter for general adult medical examination without abnormal findings: Secondary | ICD-10-CM | POA: Diagnosis not present

## 2018-03-18 DIAGNOSIS — R7989 Other specified abnormal findings of blood chemistry: Secondary | ICD-10-CM | POA: Diagnosis not present

## 2018-03-18 DIAGNOSIS — E7800 Pure hypercholesterolemia, unspecified: Secondary | ICD-10-CM | POA: Diagnosis not present

## 2018-03-18 DIAGNOSIS — Z23 Encounter for immunization: Secondary | ICD-10-CM | POA: Diagnosis not present

## 2018-03-18 DIAGNOSIS — E039 Hypothyroidism, unspecified: Secondary | ICD-10-CM | POA: Diagnosis not present

## 2018-03-18 DIAGNOSIS — F339 Major depressive disorder, recurrent, unspecified: Secondary | ICD-10-CM | POA: Diagnosis not present

## 2018-03-18 DIAGNOSIS — Z0000 Encounter for general adult medical examination without abnormal findings: Secondary | ICD-10-CM | POA: Diagnosis not present

## 2018-03-18 DIAGNOSIS — E559 Vitamin D deficiency, unspecified: Secondary | ICD-10-CM | POA: Diagnosis not present

## 2018-03-20 ENCOUNTER — Encounter: Payer: Self-pay | Admitting: Internal Medicine

## 2018-03-20 ENCOUNTER — Ambulatory Visit (AMBULATORY_SURGERY_CENTER): Payer: PPO | Admitting: Internal Medicine

## 2018-03-20 VITALS — BP 128/49 | HR 80 | Temp 97.7°F | Resp 15 | Ht 60.0 in | Wt 152.0 lb

## 2018-03-20 DIAGNOSIS — D124 Benign neoplasm of descending colon: Secondary | ICD-10-CM

## 2018-03-20 DIAGNOSIS — Z1211 Encounter for screening for malignant neoplasm of colon: Secondary | ICD-10-CM | POA: Diagnosis not present

## 2018-03-20 DIAGNOSIS — E669 Obesity, unspecified: Secondary | ICD-10-CM | POA: Diagnosis not present

## 2018-03-20 MED ORDER — SODIUM CHLORIDE 0.9 % IV SOLN: 500.0000 mL | Freq: Once | INTRAVENOUS | Status: DC

## 2018-03-20 NOTE — Op Note (Signed)
[facility] [facility] Patient Name: [name] Procedure Date: 03/20/2018 11:14 AM MRN: [id number] Endoscopist: Docia Chuck. Henrene Pastor , MD Age: 67 Referring MD:  Date of Birth: April 30, 1951 Gender: Female Account #: [id number] Procedure:                Colonoscopy, with cold snare polypectomy x 2 Indications:              Screening for colorectal malignant neoplasm.                            Previous negative examinations 2004 and 2009 Medicines:                Monitored Anesthesia Care Procedure:                Pre-Anesthesia Assessment:                           - Prior to the procedure, a History and Physical                            was performed, and patient medications and                            allergies were reviewed. The patient's tolerance of                            previous anesthesia was also reviewed. The risks                            and benefits of the procedure and the sedation                            options and risks were discussed with the patient.                            All questions were answered, and informed consent                            was obtained. Prior Anticoagulants: The patient has                            taken no previous anticoagulant or antiplatelet                            agents. ASA Grade Assessment: II - A patient with                            mild systemic disease. After reviewing the risks                            and benefits, the patient was deemed in                            satisfactory condition to undergo the procedure.  After obtaining informed consent, the colonoscope                            was passed under direct vision. Throughout the                            procedure, the patient's blood pressure, pulse, and                            oxygen saturations were monitored continuously. The                            Model CF-HQ190L [id number]) scope was introduced               through the anus and advanced to the the cecum,                            identified by appendiceal orifice and ileocecal                            valve. The ileocecal valve, appendiceal orifice,                            and rectum were photographed. The quality of the                            bowel preparation was excellent. The colonoscopy                            was performed without difficulty. The patient                            tolerated the procedure well. The bowel preparation                            used was SUPREP. Scope In: 11:21:13 AM Scope Out: 11:36:49 AM Scope Withdrawal Time: 0 hours 10 minutes 51 seconds  Total Procedure Duration: 0 hours 15 minutes 36 seconds  Findings:                 Two polyps were found in the descending colon. The                            polyps were 2 to 3 mm in size. These polyps were                            removed with a cold snare. Resection and retrieval                            were complete.                           There was fixed stenosis at the rectosigmoid  junction, presumably from adhesions.The exam was                            otherwise without abnormality on direct and                            retroflexion views. Complications:            No immediate complications. Estimated blood loss:                            None. Estimated Blood Loss:     Estimated blood loss: none. Impression:               - Two 2 to 3 mm polyps in the descending colon,                            removed with a cold snare. Resected and retrieved.                           - Fixed rectosigmoid region                           - The examination was otherwise normal on direct                            and retroflexion views. Recommendation:           - Repeat colonoscopy in 5 years for surveillance,                            if polyps adenomatous (PEDIATRIC SCOPE).                           -  Patient has a contact number available for                            emergencies. The signs and symptoms of potential                            delayed complications were discussed with the                            patient. Return to normal activities tomorrow.                            Written discharge instructions were provided to the                            patient.                           - Resume previous diet.                           - Continue present medications.                           -  Await pathology results. Docia Chuck. Henrene Pastor, MD 03/20/2018 11:41:43 AM This report has been signed electronically.

## 2018-03-20 NOTE — Patient Instructions (Signed)
HANDOUT GIVEN FOR POLYPS  YOU HAD AN ENDOSCOPIC PROCEDURE TODAY AT THE [facility]:   Refer to the procedure report that was given to you for any specific questions about what was found during the examination.  If the procedure report does not answer your questions, please call your gastroenterologist to clarify.  If you requested that your care partner not be given the details of your procedure findings, then the procedure report has been included in a sealed envelope for you to review at your convenience later.  YOU SHOULD EXPECT: Some feelings of bloating in the abdomen. Passage of more gas than usual.  Walking can help get rid of the air that was put into your GI tract during the procedure and reduce the bloating. If you had a lower endoscopy (such as a colonoscopy or flexible sigmoidoscopy) you may notice spotting of blood in your stool or on the toilet paper. If you underwent a bowel prep for your procedure, you may not have a normal bowel movement for a few days.  Please Note:  You might notice some irritation and congestion in your nose or some drainage.  This is from the oxygen used during your procedure.  There is no need for concern and it should clear up in a day or so.  SYMPTOMS TO REPORT IMMEDIATELY:   Following lower endoscopy (colonoscopy or flexible sigmoidoscopy):  Excessive amounts of blood in the stool  Significant tenderness or worsening of abdominal pains  Swelling of the abdomen that is new, acute  Fever of 100F or higher  For urgent or emergent issues, a gastroenterologist can be reached at any hour by calling [phone].   DIET:  We do recommend a small meal at first, but then you may proceed to your regular diet.  Drink plenty of fluids but you should avoid alcoholic beverages for 24 hours.  ACTIVITY:  You should plan to take it easy for the rest of today and you should NOT DRIVE or use heavy machinery until tomorrow (because of the sedation medicines  used during the test).    FOLLOW UP: Our staff will call the number listed on your records the next business day following your procedure to check on you and address any questions or concerns that you may have regarding the information given to you following your procedure. If we do not reach you, we will leave a message.  However, if you are feeling well and you are not experiencing any problems, there is no need to return our call.  We will assume that you have returned to your regular daily activities without incident.  If any biopsies were taken you will be contacted by phone or by letter within the next 1-3 weeks.  Please call us at [phone] if you have not heard about the biopsies in 3 weeks.    SIGNATURES/CONFIDENTIALITY: You and/or your care partner have signed paperwork which will be entered into your electronic medical record.  These signatures attest to the fact that that the information above on your After Visit Summary has been reviewed and is understood.  Full responsibility of the confidentiality of this discharge information lies with you and/or your care-partner. 

## 2018-03-20 NOTE — Progress Notes (Signed)
Called to room to assist during endoscopic procedure.  Patient ID and intended procedure confirmed with present staff. Received instructions for my participation in the procedure from the performing physician.  

## 2018-03-20 NOTE — Progress Notes (Signed)
Report to PACU, RN, vss, BBS= Clear.  

## 2018-03-20 NOTE — Progress Notes (Signed)
Pt's states no medical or surgical changes since previsit or office visit. 

## 2018-03-21 ENCOUNTER — Telehealth: Payer: Self-pay | Admitting: *Deleted

## 2018-03-21 NOTE — Telephone Encounter (Signed)
  Follow up Call-  Call back number 03/20/2018  Post procedure Call Back phone  # [phone]  Permission to leave phone message Yes  Some recent data might be hidden     Patient questions:  Do you have a fever, pain , or abdominal swelling? No. Pain Score  0 *  Have you tolerated food without any problems? Yes.    Have you been able to return to your normal activities? Yes.    Do you have any questions about your discharge instructions: Diet   No. Medications  No. Follow up visit  No.  Do you have questions or concerns about your Care? No.  Actions: * If pain score is 4 or above: No action needed, pain <4.

## 2018-03-26 ENCOUNTER — Encounter: Payer: Self-pay | Admitting: Internal Medicine

## 2018-04-02 DIAGNOSIS — Z6830 Body mass index (BMI) 30.0-30.9, adult: Secondary | ICD-10-CM | POA: Diagnosis not present

## 2018-04-02 DIAGNOSIS — Z1389 Encounter for screening for other disorder: Secondary | ICD-10-CM | POA: Diagnosis not present

## 2018-04-02 DIAGNOSIS — Z0131 Encounter for examination of blood pressure with abnormal findings: Secondary | ICD-10-CM | POA: Diagnosis not present

## 2018-04-02 DIAGNOSIS — Z01419 Encounter for gynecological examination (general) (routine) without abnormal findings: Secondary | ICD-10-CM | POA: Diagnosis not present

## 2018-04-02 DIAGNOSIS — Z124 Encounter for screening for malignant neoplasm of cervix: Secondary | ICD-10-CM | POA: Diagnosis not present

## 2018-04-03 DIAGNOSIS — Z6827 Body mass index (BMI) 27.0-27.9, adult: Secondary | ICD-10-CM | POA: Diagnosis not present

## 2018-04-03 DIAGNOSIS — R87610 Atypical squamous cells of undetermined significance on cytologic smear of cervix (ASC-US): Secondary | ICD-10-CM | POA: Diagnosis not present

## 2018-04-03 DIAGNOSIS — R946 Abnormal results of thyroid function studies: Secondary | ICD-10-CM | POA: Diagnosis not present

## 2018-04-03 DIAGNOSIS — R87619 Unspecified abnormal cytological findings in specimens from cervix uteri: Secondary | ICD-10-CM | POA: Diagnosis not present

## 2018-04-03 DIAGNOSIS — Z124 Encounter for screening for malignant neoplasm of cervix: Secondary | ICD-10-CM | POA: Diagnosis not present

## 2018-04-09 DIAGNOSIS — R946 Abnormal results of thyroid function studies: Secondary | ICD-10-CM | POA: Diagnosis not present

## 2018-05-02 DIAGNOSIS — L820 Inflamed seborrheic keratosis: Secondary | ICD-10-CM | POA: Diagnosis not present

## 2018-05-02 DIAGNOSIS — L821 Other seborrheic keratosis: Secondary | ICD-10-CM | POA: Diagnosis not present

## 2018-05-15 DIAGNOSIS — R946 Abnormal results of thyroid function studies: Secondary | ICD-10-CM | POA: Diagnosis not present

## 2018-05-15 DIAGNOSIS — Z6827 Body mass index (BMI) 27.0-27.9, adult: Secondary | ICD-10-CM | POA: Diagnosis not present

## 2018-05-15 DIAGNOSIS — E0590 Thyrotoxicosis, unspecified without thyrotoxic crisis or storm: Secondary | ICD-10-CM | POA: Diagnosis not present

## 2018-05-28 DIAGNOSIS — Z86000 Personal history of in-situ neoplasm of breast: Secondary | ICD-10-CM | POA: Diagnosis not present

## 2018-06-20 DIAGNOSIS — E0590 Thyrotoxicosis, unspecified without thyrotoxic crisis or storm: Secondary | ICD-10-CM | POA: Diagnosis not present

## 2018-07-29 DIAGNOSIS — E785 Hyperlipidemia, unspecified: Secondary | ICD-10-CM | POA: Diagnosis not present

## 2018-08-07 DIAGNOSIS — Z9221 Personal history of antineoplastic chemotherapy: Secondary | ICD-10-CM

## 2018-08-07 HISTORY — DX: Personal history of antineoplastic chemotherapy: Z92.21

## 2018-08-13 DIAGNOSIS — R946 Abnormal results of thyroid function studies: Secondary | ICD-10-CM | POA: Diagnosis not present

## 2018-08-13 DIAGNOSIS — E0590 Thyrotoxicosis, unspecified without thyrotoxic crisis or storm: Secondary | ICD-10-CM | POA: Diagnosis not present

## 2018-08-20 DIAGNOSIS — E0590 Thyrotoxicosis, unspecified without thyrotoxic crisis or storm: Secondary | ICD-10-CM | POA: Diagnosis not present

## 2018-08-20 DIAGNOSIS — Z6827 Body mass index (BMI) 27.0-27.9, adult: Secondary | ICD-10-CM | POA: Diagnosis not present

## 2018-08-20 DIAGNOSIS — R946 Abnormal results of thyroid function studies: Secondary | ICD-10-CM | POA: Diagnosis not present

## 2018-08-30 DIAGNOSIS — L821 Other seborrheic keratosis: Secondary | ICD-10-CM | POA: Diagnosis not present

## 2018-08-30 DIAGNOSIS — Z86018 Personal history of other benign neoplasm: Secondary | ICD-10-CM | POA: Diagnosis not present

## 2018-08-30 DIAGNOSIS — L814 Other melanin hyperpigmentation: Secondary | ICD-10-CM | POA: Diagnosis not present

## 2018-08-30 DIAGNOSIS — Z808 Family history of malignant neoplasm of other organs or systems: Secondary | ICD-10-CM | POA: Diagnosis not present

## 2018-08-30 DIAGNOSIS — Z23 Encounter for immunization: Secondary | ICD-10-CM | POA: Diagnosis not present

## 2018-08-30 DIAGNOSIS — Z85828 Personal history of other malignant neoplasm of skin: Secondary | ICD-10-CM | POA: Diagnosis not present

## 2018-08-30 DIAGNOSIS — D225 Melanocytic nevi of trunk: Secondary | ICD-10-CM | POA: Diagnosis not present

## 2018-08-30 DIAGNOSIS — L820 Inflamed seborrheic keratosis: Secondary | ICD-10-CM | POA: Diagnosis not present

## 2018-09-05 ENCOUNTER — Other Ambulatory Visit: Payer: Self-pay | Admitting: Obstetrics and Gynecology

## 2018-09-05 DIAGNOSIS — Z853 Personal history of malignant neoplasm of breast: Secondary | ICD-10-CM

## 2018-09-10 DIAGNOSIS — E7800 Pure hypercholesterolemia, unspecified: Secondary | ICD-10-CM | POA: Diagnosis not present

## 2018-09-10 DIAGNOSIS — E0590 Thyrotoxicosis, unspecified without thyrotoxic crisis or storm: Secondary | ICD-10-CM | POA: Diagnosis not present

## 2018-10-18 ENCOUNTER — Ambulatory Visit
Admission: RE | Admit: 2018-10-18 | Discharge: 2018-10-18 | Disposition: A | Discharge status: Home / Self Care | Payer: PPO | Source: Ambulatory Visit | Attending: Obstetrics and Gynecology | Admitting: Obstetrics and Gynecology

## 2018-10-18 ENCOUNTER — Other Ambulatory Visit: Payer: Self-pay

## 2018-10-18 ENCOUNTER — Other Ambulatory Visit: Payer: Self-pay | Admitting: Obstetrics and Gynecology

## 2018-10-18 DIAGNOSIS — N6322 Unspecified lump in the left breast, upper inner quadrant: Secondary | ICD-10-CM | POA: Diagnosis not present

## 2018-10-18 DIAGNOSIS — Z853 Personal history of malignant neoplasm of breast: Secondary | ICD-10-CM

## 2018-10-18 DIAGNOSIS — R928 Other abnormal and inconclusive findings on diagnostic imaging of breast: Secondary | ICD-10-CM

## 2018-10-18 DIAGNOSIS — N6320 Unspecified lump in the left breast, unspecified quadrant: Secondary | ICD-10-CM

## 2018-10-18 DIAGNOSIS — C50212 Malignant neoplasm of upper-inner quadrant of left female breast: Secondary | ICD-10-CM | POA: Diagnosis not present

## 2018-10-18 DIAGNOSIS — Z170 Estrogen receptor positive status [ER+]: Secondary | ICD-10-CM | POA: Insufficient documentation

## 2018-10-21 DIAGNOSIS — E0590 Thyrotoxicosis, unspecified without thyrotoxic crisis or storm: Secondary | ICD-10-CM | POA: Diagnosis not present

## 2018-10-21 DIAGNOSIS — E041 Nontoxic single thyroid nodule: Secondary | ICD-10-CM | POA: Diagnosis not present

## 2018-10-21 DIAGNOSIS — C50912 Malignant neoplasm of unspecified site of left female breast: Secondary | ICD-10-CM | POA: Diagnosis not present

## 2018-10-21 DIAGNOSIS — E0500 Thyrotoxicosis with diffuse goiter without thyrotoxic crisis or storm: Secondary | ICD-10-CM | POA: Diagnosis not present

## 2018-10-21 DIAGNOSIS — Z6827 Body mass index (BMI) 27.0-27.9, adult: Secondary | ICD-10-CM | POA: Diagnosis not present

## 2018-10-23 ENCOUNTER — Other Ambulatory Visit: Payer: Self-pay | Admitting: Internal Medicine

## 2018-10-23 DIAGNOSIS — E0590 Thyrotoxicosis, unspecified without thyrotoxic crisis or storm: Secondary | ICD-10-CM

## 2018-10-23 DIAGNOSIS — E041 Nontoxic single thyroid nodule: Secondary | ICD-10-CM

## 2018-10-24 ENCOUNTER — Encounter: Payer: Self-pay | Admitting: General Surgery

## 2018-10-24 ENCOUNTER — Other Ambulatory Visit: Payer: Self-pay | Admitting: General Surgery

## 2018-10-24 DIAGNOSIS — Z803 Family history of malignant neoplasm of breast: Secondary | ICD-10-CM | POA: Diagnosis not present

## 2018-10-24 DIAGNOSIS — Z9882 Breast implant status: Secondary | ICD-10-CM | POA: Diagnosis not present

## 2018-10-24 DIAGNOSIS — E0590 Thyrotoxicosis, unspecified without thyrotoxic crisis or storm: Secondary | ICD-10-CM | POA: Diagnosis not present

## 2018-10-24 DIAGNOSIS — E785 Hyperlipidemia, unspecified: Secondary | ICD-10-CM | POA: Diagnosis not present

## 2018-10-24 DIAGNOSIS — C50212 Malignant neoplasm of upper-inner quadrant of left female breast: Secondary | ICD-10-CM | POA: Diagnosis not present

## 2018-10-24 DIAGNOSIS — D0511 Intraductal carcinoma in situ of right breast: Secondary | ICD-10-CM

## 2018-10-24 DIAGNOSIS — Z853 Personal history of malignant neoplasm of breast: Secondary | ICD-10-CM | POA: Diagnosis not present

## 2018-10-29 ENCOUNTER — Other Ambulatory Visit: Payer: Self-pay

## 2018-10-29 ENCOUNTER — Other Ambulatory Visit: Payer: Self-pay | Admitting: Internal Medicine

## 2018-10-29 ENCOUNTER — Ambulatory Visit
Admission: RE | Admit: 2018-10-29 | Discharge: 2018-10-29 | Disposition: A | Discharge status: Home / Self Care | Payer: PPO | Source: Ambulatory Visit | Attending: Internal Medicine | Admitting: Internal Medicine

## 2018-10-29 DIAGNOSIS — E0590 Thyrotoxicosis, unspecified without thyrotoxic crisis or storm: Secondary | ICD-10-CM

## 2018-10-29 DIAGNOSIS — E041 Nontoxic single thyroid nodule: Secondary | ICD-10-CM

## 2018-10-30 ENCOUNTER — Ambulatory Visit
Admission: RE | Admit: 2018-10-30 | Discharge: 2018-10-30 | Disposition: A | Discharge status: Home / Self Care | Payer: PPO | Source: Ambulatory Visit | Attending: General Surgery | Admitting: General Surgery

## 2018-10-30 ENCOUNTER — Encounter: Payer: Self-pay | Admitting: Adult Health

## 2018-10-30 DIAGNOSIS — D0511 Intraductal carcinoma in situ of right breast: Secondary | ICD-10-CM | POA: Diagnosis not present

## 2018-10-30 MED ORDER — GADOBUTROL 1 MMOL/ML IV SOLN
6.0000 mL | Freq: Once | INTRAVENOUS | Status: AC | PRN
  Administered 2018-10-30: 6 mL via INTRAVENOUS

## 2018-11-01 ENCOUNTER — Other Ambulatory Visit: Payer: PPO

## 2018-11-05 ENCOUNTER — Ambulatory Visit: Payer: PPO | Admitting: Hematology

## 2018-11-05 ENCOUNTER — Telehealth: Payer: Self-pay | Admitting: Hematology

## 2018-11-05 NOTE — Progress Notes (Signed)
[place]   Telephone:([phone]) [phone] Fax:(336) [phone]   Clinic Follow up Note   Patient Care Team: [place] [facility], [place] as PCP - General (Internal Medicine) Erroll Luna, MD as Consulting Physician (General Surgery) [name], MD as Consulting Physician (Hematology) Thea Silversmith, MD as Consulting Physician (Radiation Oncology) Rockwell Germany, RN as Registered Nurse Mauro Kaufmann, RN as Registered Nurse Holley Bouche, NP (Inactive) as Nurse Practitioner (Nurse Practitioner) Sylvan Cheese, NP as Nurse Practitioner (Nurse Practitioner)  Date of Service:  11/07/2018  CHIEF COMPLAINT: Newly Diagnosed left breast cancer   SUMMARY OF ONCOLOGIC HISTORY: Oncology History   Cancer Staging Breast cancer of upper-inner quadrant of right female breast [facility]) Staging form: Breast, AJCC 7th Edition - Clinical stage from 09/23/2014: Stage 0 (Tis (DCIS), N0, M0) - Unsigned - Pathologic stage from 10/22/2014: Stage 0 (Tis (DCIS), N0, cM0) - Signed by [name], MD on 11/03/2014 Staging comments: Staged on final lumpectomy specimen by Dr. [name]  Malignant neoplasm of upper-inner quadrant of left breast in female, estrogen receptor positive ([place]) Staging form: Breast, AJCC 8th Edition - Clinical: cT1b, cN0, cM0, G2, ER+, PR+ - Unsigned      Breast cancer of upper-inner quadrant of right female breast ([place])   09/11/2014 Mammogram    Right breast: area of pleomorphic calcifications and associated architectural distortion, with irregular density at the 1 o'clock position of the right breast. No other suspicious findings in the right breast.     09/11/2014 Breast US    Right breast: area of hypoechogenecity 2 cm x 0.9 cm x 1.6 cm in size, 12 o'clock position, 3 cm the nipple. No discrete suspicious mass.    09/11/2014 Initial Biopsy    Right breast needle core bx (11-12 o'clock, UOQ): DCIS, grade 3, with necrosis and calcifications, ER- (0%), PR- (0%)    09/11/2014 Pathologic Stage    Stage 0: Tis Nx     09/17/2014 Breast MRI    Non mass enhancement in the upper and slight outer right breast measuring approximately 2.6 x 1.7 x 2.2 cm. Biopsy proven fibrocystic changes in the upper outer periareolar right breast manifested as a 9 x 7 mm mass.    09/24/2014 Procedure    Genetic testing: BRCA plus and BreastNext panels ([place]) performed showing no clinically significant variants detected including ATM, BARD1, BRCA1, BRCA2, BRIP1, CDH1, CHEK2, MRE11A, MUTYH, NBN, NF1, PALB2, PTEN, RAD50, RAD51C, RAD51D, and TP53.    10/20/2014 Definitive Surgery    Right lumpectomy with SLNB ([place]): DCIS with necrosis and calcifications, grade 3, 1 mm from nearest margin (posterior), 0/2 LN with evidence of malignancy.    10/20/2014 Clinical Stage    Stage 0: Tis N0    11/25/2014 - 01/06/2015 Radiation Therapy    Adjuvant RT completed [name]): Right breast 50 Gy over 25 fractions; right breast boost 10 Gy over 5 fractions. Total dose received: 60 Gy.     04/13/2015 Survivorship    Survivorship care plan completed and copy mailed to patient as she was unable to be seen in the Survivorship clinic due to patient's schedule.     Malignant neoplasm of upper-inner quadrant of left breast in female, estrogen receptor positive ([place])   10/18/2018 Initial Diagnosis    Malignant neoplasm of upper-inner quadrant of left breast in female, estrogen receptor positive ([place])    10/18/2018 Initial Biopsy    Diagnosis Breast, left, needle core biopsy, 10 o'clock, 4 cm fn - INVASIVE MAMMARY CARCINOMA -  MAMMARY CARCINOMA IN SITU - SEE COMMENT    10/18/2018 Receptors her2    ER: 100% with strong staining  PR: 80% with strong staining  HER2 Negative     10/18/2018 Imaging    Diagnostic Mammogram 10/18/18  IMPRESSION 2 hypoechoic masses in the left breast at 10 o'clock, 4 cm from the nipple. The larger mass measures 5 x 6 x 5 mm. The second mass measures 3 x 5 x 5 mm. The  masses are 7 mm apart and span a total dimension 1.7 cm. No axillary adenopathy on the left.    10/30/2018 Breast MRI    Breast MRI 10/30/18  IMPRESSION: Biopsy-proven malignancy within the left breast 10 o'clock position. No additional suspicious areas of enhancement identified within either breast.    11/2018 -  Anti-estrogen oral therapy    Anastrozole 20m daily starting 11/2018 (before surgery)      CURRENT THERAPY:  Anastrozole 162mdaily starting 11/2018 (before surgery) PENDING Surgery with Dr. InDalbert Batman INTERVAL HISTORY:  PaANASHA PERFECTOs was referred to me by Dr. InDalbert Batmanor her newly diagnosed left breast cancer. She was last seen by me in 11/2014 when I followed her for her previous right breast DCIS.   She presents to the clinic today by herself. She notes she did not feel the lump herself or had any skin or breast change. This was found by screening mammogram. She denies any other changes of her weight, breast, back or joint pain.   She notes she recently had [place] thyroid due to changing TSH levels. Her results show borderline large. She does not require a biopsy. I provided her with copy of results.    REVIEW OF SYSTEMS:   Constitutional: Denies fevers, chills or abnormal weight loss Eyes: Denies blurriness of vision Ears, nose, mouth, throat, and face: Denies mucositis or sore throat Respiratory: Denies cough, dyspnea or wheezes Cardiovascular: Denies palpitation, chest discomfort or lower extremity swelling Gastrointestinal:  Denies nausea, heartburn or change in bowel habits Skin: Denies abnormal skin rashes Lymphatics: Denies new lymphadenopathy or easy bruising Neurological:Denies numbness, tingling or new weaknesses Behavioral/Psych: Mood is stable, no new changes  All other systems were reviewed with the patient and are negative.  MEDICAL HISTORY:  Past Medical History:  Diagnosis Date  . Allergy   . [facility] [place]   right breast  . Breast  cancer of upper-inner quadrant of right female breast [place]  . Hot flashes   . Hyperlipidemia   . Personal history of radiation therapy 2016  . Radiation 11/25/14-01/06/15   Right Breast    SURGICAL HISTORY: Past Surgical History:  Procedure Laterality Date  . AUGMENTATION MAMMAPLASTY Bilateral 03/10/1997  . BREAST BIOPSY Right 09/11/2014  . BREAST LUMPECTOMY Right 10/20/2014  . COLONOSCOPY    . DILATION AND CURETTAGE OF UTERUS    . KNEE ARTHROSCOPY    . PLACEMENT OF BREAST IMPLANTS  1995   bilat breast implants  . TONSILLECTOMY    . TUBAL LIGATION    . WISDOM TOOTH EXTRACTION      I have reviewed the social history and family history with the patient and they are unchanged from previous note.  ALLERGIES:  has No Known Allergies.  MEDICATIONS:  Current Outpatient Medications  Medication Sig Dispense Refill  . cholecalciferol (VITAMIN D) 400 UNITS TABS tablet Take 400 Units by mouth. 1000 units    . methimazole (TAPAZOLE) 10 MG tablet Take 20 mg by mouth 3 (three)  times daily.    . Multiple Vitamin (MULTIVITAMIN) tablet Take 1 tablet by mouth daily.    . sertraline (ZOLOFT) 100 MG tablet Take 100 mg by mouth at bedtime. Takes 1-2 daily    . simvastatin (ZOCOR) 10 MG tablet Take 40 mg by mouth daily at 6 PM.     . anastrozole (ARIMIDEX) 1 MG tablet Take 1 tablet (1 mg total) by mouth daily. 30 tablet 3   No current facility-administered medications for this visit.     PHYSICAL EXAMINATION: ECOG PERFORMANCE STATUS: 0 - Asymptomatic  Vitals:   11/07/18 1107  BP: (!) 153/70  Pulse: 72  Resp: 17  Temp: 98.2 F (36.8 C)  SpO2: 96%   Filed Weights   11/07/18 1107  Weight: 146 lb 6.4 oz (66.4 kg)    GENERAL:alert, no distress and comfortable SKIN: skin color, texture, turgor are normal, no rashes or significant lesions EYES: normal, Conjunctiva are pink and non-injected, sclera clear OROPHARYNX:no exudate, no erythema and lips, buccal mucosa, and tongue  normal  NECK: supple, thyroid normal size, non-tender, without nodularity LYMPH:  no palpable lymphadenopathy in the cervical, axillary or inguinal LUNGS: clear to auscultation and percussion with normal breathing effort HEART: regular rate & rhythm and no murmurs and no lower extremity edema ABDOMEN:abdomen soft, non-tender and normal bowel sounds Musculoskeletal:no cyanosis of digits and no clubbing  NEURO: alert & oriented x 3 with fluent speech, no focal motor/sensory deficits BREAST: S/p right breast lumpectomy: Surgical incision healed well with mild scar tissue (+) mild skin ecchymosis of left breast at biopsy (+) No palpable mass or adenopathy   LABORATORY DATA:  I have reviewed the data as listed CBC Latest Ref Rng & Units 11/07/2018 09/23/2014  WBC 4.0 - 10.5 K/uL 7.4 7.1  Hemoglobin 12.0 - 15.0 g/dL 14.0 13.9  Hematocrit 36.0 - 46.0 % 44.6 43.2  Platelets 150 - 400 K/uL 263 270     CMP Latest Ref Rng & Units 09/23/2014  Glucose 70 - 140 mg/dl 95  BUN 7.0 - 26.0 mg/dL 22.2  Creatinine 0.6 - 1.1 mg/dL 0.8  Sodium 136 - 145 mEq/L 145  Potassium 3.5 - 5.1 mEq/L 4.5  CO2 22 - 29 mEq/L 27  Calcium 8.4 - 10.4 mg/dL 9.5  Total Protein 6.4 - 8.3 g/dL 6.6  Total Bilirubin 0.20 - 1.20 mg/dL 0.37  Alkaline Phos 40 - 150 U/L 88  AST 5 - 34 U/L 19  ALT 0 - 55 U/L 20      RADIOGRAPHIC STUDIES: I have personally reviewed the radiological images as listed and agreed with the findings in the report. No results found.   ASSESSMENT & PLAN:  LATIVIA [name] is a 68 y.o. female with   1. Malignant neoplasm of upper-inner quadrant of left breast, invasive lobular carcinoma, Stage Ib, c(T1b,N0,M0), ER/PR+, HER2-, Grade II We discussed her image findings and the biopsy results in great details.  -Giving the early stage disease, she likely need a lumpectomy and SLN biopsy. She is agreeable with that. She has been seen by Dr. Dalbert Batman recent;y and will proceed with surgery in the next few  months.  -I recommend a Oncotype Dx test on the surgical sample and we'll make a decision about adjuvant chemotherapy based on the Oncotype result. Written material of this test was given to her. She is young and fit, would be a good candidate for chemotherapy if her Oncotype recurrence score is high. Given the lobular histology, and strong ER/PR positive  disease, I do not suspect this will be high risk disease.  -If her surgical sentinel lymph node positive, I recommend mammaprint for further risk stratification and guide adjuvant chemotherapy. -The risk of recurrence depends on the stage and biology of the tumor. She is early stage, with ER/PR positive and HER2 negative markers. I discussed this is the more common type of slow growing tumor.  -She will likely proceed with adjuvant radiation post surgery. If her surgical sentinel lymph nodes were negative, she would not need post mastectomy radiation. Otherwise radiation is recommended to reduce the risk for local recurrence.  --Given the strong ER and PR positivity, I do recommend adjuvant aromatase inhibitor to reduce her risk of cancer recurrence,  The potential benefit and side effects, which includes but not limited to, hot flash, skin and vaginal dryness, metabolic changes ( increased blood glucose, cholesterol, weight, etc.), slightly in increased risk of cardiovascular disease, cataracts, muscular and joint discomfort, osteopenia and osteoporosis, etc, were discussed with her in great details. She is interested. -Due to the current COVID 19 outbreak, her breast surgery may be postponed, I will start her on Anastrozole in the meantime. She is agreeable. I encouraged her to watch for joint pain and hot flashes.  -Will do baseline labs today  -E-visit in 2 months and f/u after surgery   2. 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 She is s/p right lumpectomy and SLNB and adjuvant radiation.  -Given her  negative ER/PR status, there is no benefit of antiestrogen therapy. We previously discussed chemoprevention for breast cancer in general, and I do not recommend it given her history of ER/PR negative DCIS  -Continue surveillance. No evidence of recurrence.   3. Genetics -The BRCA plus panel, which includes a BRCA1, BRCA2, CDH1, PALB2, PTEN, TP53 were all negative. -Given this is her 2nd breast cancer, she will have more genetic testing today which will help determine her surgery.   4. Bone health  -She has not had DEXA scan in years  -Will repeat later this year as she will be on anastrozole which can weaken her bone.   PLAN: -Prescribe Anastrozole today to start in the next week  -Lab today  -E-visit in 2 months and f/u after surgery    No problem-specific Assessment & Plan notes found for this encounter.   Orders Placed This Encounter  Procedures  . CBC with Differential (Cancer Center Only)    Standing Status:   Standing    Number of Occurrences:   50    Standing Expiration Date:   11/07/2023  . CMP ([place] only)    Standing Status:   Standing    Number of Occurrences:   50    Standing Expiration Date:   11/07/2023   All questions were answered. The patient knows to call the clinic with any problems, questions or concerns. No barriers to learning was detected. I spent 30 minutes counseling the patient face to face. The total time spent in the appointment was 40 minutes and more than 50% was on counseling and review of test results     [name], MD 11/07/2018   I, Joslyn Devon, am acting as scribe for [name], MD.   I have reviewed the above documentation for accuracy and completeness, and I agree with the above.

## 2018-11-05 NOTE — Telephone Encounter (Signed)
Scheduled apt per sch message - unable to reach patient left message with appt date and time

## 2018-11-06 ENCOUNTER — Ambulatory Visit: Payer: PPO | Admitting: Hematology

## 2018-11-07 ENCOUNTER — Encounter: Payer: Self-pay | Admitting: Hematology

## 2018-11-07 ENCOUNTER — Inpatient Hospital Stay: Payer: PPO

## 2018-11-07 ENCOUNTER — Other Ambulatory Visit: Payer: Self-pay

## 2018-11-07 ENCOUNTER — Encounter: Payer: Self-pay | Admitting: Genetic Counselor

## 2018-11-07 ENCOUNTER — Telehealth: Payer: Self-pay | Admitting: Hematology

## 2018-11-07 ENCOUNTER — Inpatient Hospital Stay: Payer: PPO | Admitting: Genetic Counselor

## 2018-11-07 ENCOUNTER — Inpatient Hospital Stay: Payer: PPO | Attending: Hematology | Admitting: Hematology

## 2018-11-07 VITALS — BP 153/70 | HR 72 | Temp 98.2°F | Resp 17 | Ht 60.0 in | Wt 146.4 lb

## 2018-11-07 DIAGNOSIS — Z170 Estrogen receptor positive status [ER+]: Secondary | ICD-10-CM | POA: Diagnosis not present

## 2018-11-07 DIAGNOSIS — Z800 Family history of malignant neoplasm of digestive organs: Secondary | ICD-10-CM | POA: Diagnosis not present

## 2018-11-07 DIAGNOSIS — Z803 Family history of malignant neoplasm of breast: Secondary | ICD-10-CM

## 2018-11-07 DIAGNOSIS — D0511 Intraductal carcinoma in situ of right breast: Secondary | ICD-10-CM

## 2018-11-07 DIAGNOSIS — Z79811 Long term (current) use of aromatase inhibitors: Secondary | ICD-10-CM | POA: Insufficient documentation

## 2018-11-07 DIAGNOSIS — C50212 Malignant neoplasm of upper-inner quadrant of left female breast: Secondary | ICD-10-CM | POA: Insufficient documentation

## 2018-11-07 LAB — CBC WITH DIFFERENTIAL (CANCER CENTER ONLY)
Abs Immature Granulocytes: 0.01 10*3/uL (ref 0.00–0.07)
Basophils Absolute: 0 10*3/uL (ref 0.0–0.1)
Basophils Relative: 1 %
Eosinophils Absolute: 0 10*3/uL (ref 0.0–0.5)
Eosinophils Relative: 0 %
HCT: 44.6 % (ref 36.0–46.0)
Hemoglobin: 14 g/dL (ref 12.0–15.0)
Immature Granulocytes: 0 %
Lymphocytes Relative: 37 %
Lymphs Abs: 2.7 10*3/uL (ref 0.7–4.0)
MCH: 27.1 pg (ref 26.0–34.0)
MCHC: 31.4 g/dL (ref 30.0–36.0)
MCV: 86.4 fL (ref 80.0–100.0)
Monocytes Absolute: 0.5 10*3/uL (ref 0.1–1.0)
Monocytes Relative: 7 %
Neutro Abs: 4.1 10*3/uL (ref 1.7–7.7)
Neutrophils Relative %: 55 %
Platelet Count: 263 10*3/uL (ref 150–400)
RBC: 5.16 MIL/uL — ABNORMAL HIGH (ref 3.87–5.11)
RDW: 13.3 % (ref 11.5–15.5)
WBC Count: 7.4 10*3/uL (ref 4.0–10.5)
nRBC: 0 % (ref 0.0–0.2)

## 2018-11-07 LAB — CMP (CANCER CENTER ONLY)
ALT: 24 U/L (ref 0–44)
AST: 19 U/L (ref 15–41)
Albumin: 4 g/dL (ref 3.5–5.0)
Alkaline Phosphatase: 124 U/L (ref 38–126)
Anion gap: 9 (ref 5–15)
BUN: 12 mg/dL (ref 8–23)
CO2: 27 mmol/L (ref 22–32)
Calcium: 9.5 mg/dL (ref 8.9–10.3)
Chloride: 107 mmol/L (ref 98–111)
Creatinine: 0.76 mg/dL (ref 0.44–1.00)
GFR, Est AFR Am: >60 mL/min (ref >60)
GFR, Estimated: >60 mL/min (ref >60)
Glucose, Bld: 101 mg/dL — ABNORMAL HIGH (ref 70–99)
Potassium: 4.4 mmol/L (ref 3.5–5.1)
Sodium: 143 mmol/L (ref 135–145)
Total Bilirubin: 0.4 mg/dL (ref 0.3–1.2)
Total Protein: 7 g/dL (ref 6.5–8.1)

## 2018-11-07 MED ORDER — ANASTROZOLE 1 MG PO TABS: 1.0000 mg | ORAL_TABLET | Freq: Every day | ORAL | 3 refills | Status: DC

## 2018-11-07 NOTE — Progress Notes (Signed)
REFERRING PROVIDER: [name], MD [place], [place] [address]  PRIMARY PROVIDER:  Holland Commons, FNP  PRIMARY REASON FOR VISIT:  1. Malignant neoplasm of upper-inner quadrant of left breast in female, estrogen receptor positive ([place])   2. Family history of breast cancer   3. Family history of colon cancer   4. Family history of pancreatic cancer   5. Ductal carcinoma in situ (DCIS) of right breast      HISTORY OF PRESENT ILLNESS:   Ms. Tessler, a 68 y.o. female, was seen for a [place] cancer genetics consultation at the request of Dr. Burr Medico due to a personal and family history of cancer.  Ms. Trigueros presents to clinic today to discuss the possibility of a hereditary predisposition to cancer, genetic testing, and to further clarify her future cancer risks, as well as potential cancer risks for family members.   In 2016, at the age of 41, Ms. Cadden was diagnosed with DCIS of the right breast. The treatment plan lumpectomy and radiation. The tumor was ER/PR-. Genetic testing was performed at that time through the BreastNext panel.  The testing was negative. In March 2020, at the age of 62, Ms. Croker was diagnosed with invasive ductal carcinoma of the left breast.  The tumor is ER+/PR+/Her2-.    CANCER HISTORY:  Oncology History   Cancer Staging Breast cancer of upper-inner quadrant of right female breast [facility]) Staging form: Breast, AJCC 7th Edition - Clinical stage from 09/23/2014: Stage 0 (Tis (DCIS), N0, M0) - Unsigned - Pathologic stage from 10/22/2014: Stage 0 (Tis (DCIS), N0, cM0) - Signed by [name], MD on 11/03/2014 Staging comments: Staged on final lumpectomy specimen by Dr. [name]  Malignant neoplasm of upper-inner quadrant of left breast in female, estrogen receptor positive ([place]) Staging form: Breast, AJCC 8th Edition - Clinical: cT1b, cN0, cM0, G2, ER+, PR+ - Unsigned      Breast cancer of upper-inner quadrant of right female breast ([place])   09/11/2014 Mammogram    Right breast: area of pleomorphic calcifications and associated architectural distortion, with irregular density at the 1 o'clock position of the right breast. No other suspicious findings in the right breast.     09/11/2014 Breast US    Right breast: area of hypoechogenecity 2 cm x 0.9 cm x 1.6 cm in size, 12 o'clock position, 3 cm the nipple. No discrete suspicious mass.    09/11/2014 Initial Biopsy    Right breast needle core bx (11-12 o'clock, UOQ): DCIS, grade 3, with necrosis and calcifications, ER- (0%), PR- (0%)    09/11/2014 Pathologic Stage    Stage 0: Tis Nx     09/17/2014 Breast MRI    Non mass enhancement in the upper and slight outer right breast measuring approximately 2.6 x 1.7 x 2.2 cm. Biopsy proven fibrocystic changes in the upper outer periareolar right breast manifested as a 9 x 7 mm mass.    09/24/2014 Procedure    Genetic testing: BRCA plus and BreastNext panels ([place]) performed showing no clinically significant variants detected including ATM, BARD1, BRCA1, BRCA2, BRIP1, CDH1, CHEK2, MRE11A, MUTYH, NBN, NF1, PALB2, PTEN, RAD50, RAD51C, RAD51D, and TP53.    10/20/2014 Definitive Surgery    Right lumpectomy with SLNB ([place]): DCIS with necrosis and calcifications, grade 3, 1 mm from nearest margin (posterior), 0/2 LN with evidence of malignancy.    10/20/2014 Clinical Stage    Stage 0: Tis N0    11/25/2014 - 01/06/2015 Radiation Therapy  Adjuvant RT completed [name]): Right breast 50 Gy over 25 fractions; right breast boost 10 Gy over 5 fractions. Total dose received: 60 Gy.     04/13/2015 Survivorship    Survivorship care plan completed and copy mailed to patient as she was unable to be seen in the Survivorship clinic due to patient's schedule.     Malignant neoplasm of upper-inner quadrant of left breast in female, estrogen receptor positive ([place])   10/18/2018 Initial Diagnosis    Malignant neoplasm of upper-inner quadrant of left breast in  female, estrogen receptor positive ([place])    10/18/2018 Initial Biopsy    Diagnosis Breast, left, needle core biopsy, 10 o'clock, 4 cm fn - INVASIVE MAMMARY CARCINOMA - MAMMARY CARCINOMA IN SITU - SEE COMMENT    10/18/2018 Receptors her2    ER: 100% with strong staining  PR: 80% with strong staining  HER2 Negative     10/18/2018 Imaging    Diagnostic Mammogram 10/18/18  IMPRESSION 2 hypoechoic masses in the left breast at 10 o'clock, 4 cm from the nipple. The larger mass measures 5 x 6 x 5 mm. The second mass measures 3 x 5 x 5 mm. The masses are 7 mm apart and span a total dimension 1.7 cm. No axillary adenopathy on the left.    10/18/2018 Cancer Staging    Staging form: Breast, AJCC 8th Edition - Clinical stage from 10/18/2018: Stage IA (cT1b, cN0, cM0, G2, ER+, PR+, HER2-) - Signed by [name], MD on 11/07/2018    10/30/2018 Breast MRI    Breast MRI 10/30/18  IMPRESSION: Biopsy-proven malignancy within the left breast 10 o'clock position. No additional suspicious areas of enhancement identified within either breast.    11/2018 -  Anti-estrogen oral therapy    Anastrozole 47m daily starting 11/2018 (before surgery)      RISK FACTORS:  Menarche was at age 89  First live birth at age 68  Ovaries intact: yes.  Hysterectomy: no.  Menopausal status: postmenopausal.  HRT use: 0 years. Colonoscopy: yes; a few polyps. Mammogram within the last year: yes. Number of breast biopsies: 2. Up to date with pelvic exams: yes. Any excessive radiation exposure in the past: yes  Past Medical History:  Diagnosis Date  . Allergy   . Breast cancer ([place] 09/11/14   right breast  . Breast cancer of upper-inner quadrant of right female breast ([place] 09/15/2014  . Family history of breast cancer   . Family history of colon cancer   . Family history of pancreatic cancer   . Hot flashes   . Hyperlipidemia   . Personal history of radiation therapy 2016  . Radiation 11/25/14-01/06/15   Right  Breast    Past Surgical History:  Procedure Laterality Date  . AUGMENTATION MAMMAPLASTY Bilateral 03/10/1997  . BREAST BIOPSY Right 09/11/2014  . BREAST LUMPECTOMY Right 10/20/2014  . COLONOSCOPY    . DILATION AND CURETTAGE OF UTERUS    . KNEE ARTHROSCOPY    . PLACEMENT OF BREAST IMPLANTS  1995   bilat breast implants  . TONSILLECTOMY    . TUBAL LIGATION    . WISDOM TOOTH EXTRACTION      Social History   Socioeconomic History  . Marital status: Married    Spouse name: Not on file  . Number of children: 2  . Years of education: Not on file  . Highest education level: Not on file  Occupational History  . Not on file  Social Needs  . Financial resource strain:  Not on file  . Food insecurity:    Worry: Not on file    Inability: Not on file  . Transportation needs:    Medical: Not on file    Non-medical: Not on file  Tobacco Use  . Smoking status: Never Smoker  . Smokeless tobacco: Never Used  Substance and Sexual Activity  . Alcohol use: Yes    Comment: occasional  . Drug use: No  . Sexual activity: Not on file  Lifestyle  . Physical activity:    Days per week: Not on file    Minutes per session: Not on file  . Stress: Not on file  Relationships  . Social connections:    Talks on phone: Not on file    Gets together: Not on file    Attends religious service: Not on file    Active member of club or organization: Not on file    Attends meetings of clubs or organizations: Not on file    Relationship status: Not on file  Other Topics Concern  . Not on file  Social History Narrative  . Not on file     FAMILY HISTORY:  We obtained a detailed, 4-generation family history.  Significant diagnoses are listed below: Family History  Problem Relation Age of Onset  . Breast cancer Father 20  . Prostate cancer Father 77  . Throat cancer Father 16  . Cancer Father   . Breast cancer Mother        in early 54's  . Pancreatic cancer Maternal Uncle 75  . Cancer  Maternal Uncle   . Pancreatic cancer Maternal Grandmother 33  . Cancer Maternal Grandmother   . Cancer Maternal Grandfather 65       leukemia  . Alcohol abuse Brother   . Alcohol abuse Paternal Uncle   . Lung cancer Maternal Uncle   . Colon cancer Maternal Uncle   . Colon polyps Neg Hx   . Esophageal cancer Neg Hx   . Rectal cancer Neg Hx   . Stomach cancer Neg Hx     The patient has two children who are cancer free.  She has one brother who died at 24 from liver cirrhosis.  Her father is deceased and her mother is living.  The patient's mother was diagnosed with breast cancer at 31.  She had three brothers.  One brother had pancreatic cancer, a second brother had lung cancer and a third brother had colon cancer.  The maternal grandparents both died of cancer.  The grandfather from leukemia and lymphoma and the grandmother from pancreatic cancer.  The patient's father had breast, prostate and throat cancer and died at 33.  He had two sisters and a brother who did not have cancer.  The paternal grandparents are deceased.  Ms. Desantiago is unaware of previous family history of genetic testing for hereditary cancer risks. Patient's maternal ancestors are of Caucasian descent, and paternal ancestors are of Caucasian descent. There is no reported Ashkenazi Jewish ancestry. There is no known consanguinity.  GENETIC COUNSELING ASSESSMENT: Ms. Harkless is a 68 y.o. female with a personal and family history of cancer which is somewhat suggestive of a hereditary cancer syndrome and predisposition to cancer. We, therefore, discussed and recommended the following at today's visit.   DISCUSSION: We discussed that 5 - 10% of breast cancer is hereditary, with most cases associated with BRCA mutations.  There are other genes that can be associated with hereditary breast cancer syndromes.  These include ATM,  CHEK2 and PALB2.  Ms. Dlugosz had genetic testing in 2016 from the BreastNext panel.  This testing was  negative. At the time of her original testing, Ms. Idrovo reported that her uncle with colon cancer had liver cancer.  Knowing that his primary cancer is colon cancer, there is a chance that her maternal family history could be due to Lynch syndrome.  The Lynch syndrome genes had not been tested on her original testing.  Based on the family history of two cases of pancreatic cancer and colon cancer, we should consider testing.  We reviewed the characteristics, features and inheritance patterns of hereditary cancer syndromes. We also discussed genetic testing, including the appropriate family members to test, the process of testing, insurance coverage and turn-around-time for results. We discussed the implications of a negative, positive and/or variant of uncertain significant result. We recommended Ms. Xie pursue genetic testing for the common hereditary cancer gene panel. The Hereditary Gene Panel offered by [facility] includes sequencing and/or deletion duplication testing of the following 48 genes: APC, ATM, AXIN2, BARD1, BMPR1A, BRCA1, BRCA2, BRIP1, CDH1, CDK4, CDKN2A (p14ARF), CDKN2A (p16INK4a), CHEK2, CTNNA1, DICER1, EPCAM (Deletion/duplication testing only), GREM1 (promoter region deletion/duplication testing only), KIT, MEN1, MLH1, MSH2, MSH3, MSH6, MUTYH, NBN, NF1, NHTL1, PALB2, PDGFRA, PMS2, POLD1, POLE, PTEN, RAD50, RAD51C, RAD51D, RNF43, SDHB, SDHC, SDHD, SMAD4, SMARCA4. STK11, TP53, TSC1, TSC2, and VHL.  The following genes were evaluated for sequence changes only: SDHA and HOXB13 c.251G>A variant only.   Based on Ms. Spitzley's personal and family history of cancer, she meets medical criteria for genetic testing. Despite that she meets criteria, she may still have an out of pocket cost. We discussed that if her out of pocket cost for testing is over $100, the laboratory will call and confirm whether she wants to proceed with testing.  If the out of pocket cost of testing is less than $100 she will be  billed by the genetic testing laboratory.   In order to estimate Ms. Rubey's chance of having a Lynch syndrome mutation, we used statistical models (MMRPro and PREM1,2,6) that consider her personal medical history, family history and ancestry.  Because each model is different, there can be a lot of variability in the risks they give.  Therefore, these numbers must be considered a rough range and not a precise risk of having a Lynch syndrome mutation.  These models estimate that she has approximately a 0.02-0.5% chance of having a mutation.   PLAN: After considering the risks, benefits, and limitations, Ms. Sterba provided informed consent to pursue genetic testing and the blood sample was sent to [facility] for analysis of the common hereditary cancer panel. Results should be available within approximately 2-3 weeks' time, at which point they will be disclosed by telephone to Ms. Rehman, as will any additional recommendations warranted by these results. Ms. Regula will receive a summary of her genetic counseling visit and a copy of her results once available. This information will also be available in Epic.   Lastly, we encouraged Ms. Kotas to remain in contact with cancer genetics annually so that we can continuously update the family history and inform her of any changes in cancer genetics and testing that may be of benefit for this family.   Ms. Gluth questions were answered to her satisfaction today. Our contact information was provided should additional questions or concerns arise. Thank you for the referral and allowing [place] to share in the care of your patient.   Edelyn Heidel P. Florene Glen, [place],  [facility] [place] Certified Dietitian [email or web address] phone: [phone]  The patient was seen for a total of 30 minutes in face-to-face genetic counseling.  This patient was discussed with Drs. Magrinat, Lindi Adie and/or Burr Medico who agrees with the above.     _______________________________________________________________________ For Office Staff:  Number of people involved in session: 1 Was an Intern/ student involved with case: no

## 2018-11-07 NOTE — Telephone Encounter (Signed)
Scheduled appt per 4/2 los.  Patient aware of appt date and time.  (patient was never checked in for 11:00 lab, had to reschedule and check the patient in for lab)

## 2018-11-08 ENCOUNTER — Encounter: Payer: Self-pay | Admitting: *Deleted

## 2018-11-11 ENCOUNTER — Telehealth: Payer: Self-pay

## 2018-11-11 NOTE — Telephone Encounter (Signed)
-----   Message from [name], MD sent at 11/09/2018  4:51 PM EDT ----- Please let pt know her lab results, no concerns, thanks   [name]  11/09/2018

## 2018-11-11 NOTE — Telephone Encounter (Signed)
Spoke with patient regarding lab results, no concerns per Dr. Burr Medico, she verbalized an understanding.

## 2018-11-14 ENCOUNTER — Telehealth: Payer: Self-pay | Admitting: Genetic Counselor

## 2018-11-14 ENCOUNTER — Encounter: Payer: Self-pay | Admitting: Genetic Counselor

## 2018-11-14 DIAGNOSIS — Z1379 Encounter for other screening for genetic and chromosomal anomalies: Secondary | ICD-10-CM | POA: Insufficient documentation

## 2018-11-14 NOTE — Telephone Encounter (Signed)
Revealed negative genetic testing.  Discussed that we do not know why she has breast cancer or why there is cancer in the family. It could be due to a different gene that we are not testing, or maybe our current technology may not be able to pick something up.  It will be important for her to keep in contact with genetics to keep up with whether additional testing may be needed.  AXIN2 VUS identified.  This is still a normal reult  We will not change medical management based on this testing.

## 2018-11-15 ENCOUNTER — Ambulatory Visit: Payer: Self-pay | Admitting: Genetic Counselor

## 2018-11-15 DIAGNOSIS — Z1379 Encounter for other screening for genetic and chromosomal anomalies: Secondary | ICD-10-CM

## 2018-11-15 NOTE — Progress Notes (Addendum)
HPI:  Ms. Clausen was previously seen in the [facility] due to a personal and family history of cancer and concerns regarding a hereditary predisposition to cancer. Please refer to our prior cancer genetics clinic note for more information regarding our discussion, assessment and recommendations, at the time. Ms. Bible's recent genetic test results were disclosed to her, as were recommendations warranted by these results. These results and recommendations are discussed in more detail below. ° °CANCER HISTORY:  °Oncology History  ° Cancer Staging °Breast cancer of upper-inner quadrant of right female breast (HCC) °Staging form: Breast, AJCC 7th Edition °- Clinical stage from 09/23/2014: Stage 0 (Tis (DCIS), N0, M0) - Unsigned °- Pathologic stage from 10/22/2014: Stage 0 (Tis (DCIS), N0, cM0) - Signed by Kish, Joshua, MD on 11/03/2014 °Staging comments: Staged on final lumpectomy specimen by Dr. Rund ° °Malignant neoplasm of upper-inner quadrant of left breast in female, estrogen receptor positive (HCC) °Staging form: Breast, AJCC 8th Edition °- Clinical: cT1b, cN0, cM0, G2, ER+, PR+ - Unsigned ° °  °  °Breast cancer of upper-inner quadrant of right female breast (HCC)  ° 09/11/2014 Mammogram  °  Right breast: area of pleomorphic calcifications and associated architectural distortion, with irregular density at the 1 o'clock position of the right breast. No other suspicious findings in the right breast.  °  ° 09/11/2014 Breast US  °  Right breast: area of hypoechogenecity 2 cm x 0.9 cm x 1.6 cm in size, 12 o'clock position, 3 cm the nipple. No discrete suspicious mass. °  ° 09/11/2014 Initial Biopsy  °  Right breast needle core bx (11-12 o'clock, UOQ): DCIS, grade 3, with necrosis and calcifications, ER- (0%), PR- (0%) °  ° 09/11/2014 Pathologic Stage  °  Stage 0: Tis Nx  °  ° 09/17/2014 Breast MRI  °  Non mass enhancement in the upper and slight outer right breast measuring approximately 2.6 x 1.7 x 2.2  cm. Biopsy proven fibrocystic changes in the upper outer periareolar right breast manifested as a 9 x 7 mm mass. °  ° 09/24/2014 Procedure  °  Genetic testing: BRCA plus and BreastNext panels ([place]) performed showing no clinically significant variants detected including ATM, BARD1, BRCA1, BRCA2, BRIP1, CDH1, CHEK2, MRE11A, MUTYH, NBN, NF1, PALB2, PTEN, RAD50, RAD51C, RAD51D, and TP53. °  ° 10/20/2014 Definitive Surgery  °  Right lumpectomy with SLNB ([place]): DCIS with necrosis and calcifications, grade 3, 1 mm from nearest margin (posterior), 0/2 LN with evidence of malignancy. °  ° 10/20/2014 Clinical Stage  °  Stage 0: Tis N0 °  ° 11/25/2014 - 01/06/2015 Radiation Therapy  °  Adjuvant RT completed ([facility]): Right breast 50 Gy over 25 fractions; right breast boost 10 Gy over 5 fractions. Total dose received: 60 Gy.  °  ° 04/13/2015 Survivorship  °  Survivorship care plan completed and copy mailed to patient as she was unable to be seen in the Survivorship clinic due to patient's schedule. °  ° 11/12/2018 Genetic Testing  °  Negative genetic testing on the common hereditary cancer panel.  The Hereditary Gene Panel offered by [facility] includes sequencing and/or deletion duplication testing of the following 48 genes: APC, ATM, AXIN2, BARD1, BMPR1A, BRCA1, BRCA2, BRIP1, CDH1, CDK4, CDKN2A (p14ARF), CDKN2A (p16INK4a), CHEK2, CTNNA1, DICER1, EPCAM (Deletion/duplication testing only), GREM1 (promoter region deletion/duplication testing only), KIT, MEN1, MLH1, MSH2, MSH3, MSH6, MUTYH, NBN, NF1, NHTL1, PALB2, PDGFRA, PMS2, POLD1, POLE, PTEN, RAD50, RAD51C, RAD51D, RNF43, SDHB, SDHC, SDHD, SMAD4,   SMARCA4. STK11, TP53, TSC1, TSC2, and VHL.  The following genes were evaluated for sequence changes only: SDHA and HOXB13 c.251G>A variant only. The report date is 11/12/2018. ° °AXIN2 c.815+fdup (Intronic) VUS identified.  This is still a normal result and will not change medical management. °  °  °Malignant neoplasm of upper-inner  quadrant of left breast in female, estrogen receptor positive (HCC)  ° 10/18/2018 Initial Diagnosis  °  Malignant neoplasm of upper-inner quadrant of left breast in female, estrogen receptor positive (HCC) °  ° 10/18/2018 Initial Biopsy  °  Diagnosis °Breast, left, needle core biopsy, 10 o'clock, 4 cm fn °- INVASIVE MAMMARY CARCINOMA °- MAMMARY CARCINOMA IN SITU °- SEE COMMENT °  ° 10/18/2018 Receptors her2  °  ER: 100% with strong staining  °PR: 80% with strong staining  °HER2 Negative  °  ° 10/18/2018 Imaging  °  Diagnostic Mammogram 10/18/18  °IMPRESSION °2 hypoechoic masses in the °left breast at 10 o'clock, 4 cm from the nipple. The larger mass °measures 5 x 6 x 5 mm. The second mass measures 3 x 5 x 5 mm. The °masses are 7 mm apart and span a total dimension 1.7 cm. No axillary °adenopathy on the left. °  ° 10/18/2018 Cancer Staging  °  Staging form: Breast, AJCC 8th Edition °- Clinical stage from 10/18/2018: Stage IA (cT1b, cN0, cM0, G2, ER+, PR+, HER2-) - Signed by Feng, Yan, MD on 11/07/2018 °  ° 10/30/2018 Breast MRI  °  Breast MRI 10/30/18  °IMPRESSION: °Biopsy-proven malignancy within the left breast 10 o'clock position. °No additional suspicious areas of enhancement identified within °either breast. °  ° 11/2018 -  Anti-estrogen oral therapy  °  Anastrozole 1mg daily starting 11/2018 (before surgery) °  ° 11/12/2018 Genetic Testing  °  Negative genetic testing on the common hereditary cancer panel.  The Hereditary Gene Panel offered by [facility] includes sequencing and/or deletion duplication testing of the following 48 genes: APC, ATM, AXIN2, BARD1, BMPR1A, BRCA1, BRCA2, BRIP1, CDH1, CDK4, CDKN2A (p14ARF), CDKN2A (p16INK4a), CHEK2, CTNNA1, DICER1, EPCAM (Deletion/duplication testing only), GREM1 (promoter region deletion/duplication testing only), KIT, MEN1, MLH1, MSH2, MSH3, MSH6, MUTYH, NBN, NF1, NHTL1, PALB2, PDGFRA, PMS2, POLD1, POLE, PTEN, RAD50, RAD51C, RAD51D, RNF43, SDHB, SDHC, SDHD, SMAD4, SMARCA4. STK11,  TP53, TSC1, TSC2, and VHL.  The following genes were evaluated for sequence changes only: SDHA and HOXB13 c.251G>A variant only. The report date is 11/12/2018. ° °AXIN2 c.815+fdup (Intronic) VUS identified.  This is still a normal result and will not change medical management. °  ° ° °FAMILY HISTORY:  °We obtained a detailed, 4-generation family history.  Significant diagnoses are listed below: °Family History  °Problem Relation Age of Onset  ° Breast cancer Father 55  ° Prostate cancer Father 80  ° Throat cancer Father 82  ° Cancer Father   ° Breast cancer Mother   °     in early 80's  ° Pancreatic cancer Maternal Uncle 75  ° Cancer Maternal Uncle   ° Pancreatic cancer Maternal Grandmother 84  ° Cancer Maternal Grandmother   ° Cancer Maternal Grandfather 68  °     leukemia  ° Alcohol abuse Brother   ° Alcohol abuse Paternal Uncle   ° Lung cancer Maternal Uncle   ° Colon cancer Maternal Uncle   ° Colon polyps Neg Hx   ° Esophageal cancer Neg Hx   ° Rectal cancer Neg Hx   ° Stomach cancer Neg Hx   ° ° °The   patient has two children who are cancer free.  She has one brother who died at 40 from liver cirrhosis.  Her father is deceased and her mother is living.   The patient's mother was diagnosed with breast cancer at 70.  She had three brothers.  One brother had pancreatic cancer, a second brother had lung cancer and a third brother had colon cancer.  The maternal grandparents both died of cancer.  The grandfather from leukemia and lymphoma and the grandmother from pancreatic cancer.   The patient's father had breast, prostate and throat cancer and died at 38.  He had two sisters and a brother who did not have cancer.  The paternal grandparents are deceased.   Ms. Maqueda is unaware of previous family history of genetic testing for hereditary cancer risks. Patient's maternal ancestors are of Caucasian descent, and paternal ancestors are of Caucasian descent. There is no reported Ashkenazi Jewish ancestry. There is  no known consanguinity.   GENETIC TEST RESULTS: Genetic testing reported out on November 12, 2018 through the common hereditary cancer panel found no pathogenic mutations. The Hereditary Gene Panel offered by [facility] includes sequencing and/or deletion duplication testing of the following 48 genes: APC, ATM, AXIN2, BARD1, BMPR1A, BRCA1, BRCA2, BRIP1, CDH1, CDK4, CDKN2A (p14ARF), CDKN2A (p16INK4a), CHEK2, CTNNA1, DICER1, EPCAM (Deletion/duplication testing only), GREM1 (promoter region deletion/duplication testing only), KIT, MEN1, MLH1, MSH2, MSH3, MSH6, MUTYH, NBN, NF1, NHTL1, PALB2, PDGFRA, PMS2, POLD1, POLE, PTEN, RAD50, RAD51C, RAD51D, RNF43, SDHB, SDHC, SDHD, SMAD4, SMARCA4. STK11, TP53, TSC1, TSC2, and VHL.  The following genes were evaluated for sequence changes only: SDHA and HOXB13 c.251G>A variant only. The test report has been scanned into EPIC and is located under the Molecular Pathology section of the Results Review tab.  A portion of the result report is included below for reference.     We discussed with Ms. Lanyon that because current genetic testing is not perfect, it is possible there may be a gene mutation in one of these genes that current testing cannot detect, but that chance is small.  We also discussed, that there could be another gene that has not yet been discovered, or that we have not yet tested, that is responsible for the cancer diagnoses in the family. It is also possible there is a hereditary cause for the cancer in the family that Ms. Purrington did not inherit and therefore was not identified in her testing.  Therefore, it is important to remain in touch with cancer genetics in the future so that we can continue to offer Ms. Aronov the most up to date genetic testing.   Genetic testing did identify a variant of uncertain significance (VUS) was identified in the AXIN2 gene called c.815+4dup (Intronic).  At this time, it is unknown if this variant is associated with increased cancer  risk or if this is a normal finding, but most variants such as this get reclassified to being inconsequential. It should not be used to make medical management decisions. With time, we suspect the lab will determine the significance of this variant, if any. If we do learn more about it, we will try to contact Ms. Mooneyham to discuss it further. However, it is important to stay in touch with [place] periodically and keep the address and phone number up to date.  UPDATE: AXIN2 c.815+fdup (Intronic) VUS has been reclassified as Likely Benign.  The amended report date is March 21, 2021  ADDITIONAL GENETIC TESTING: We discussed with Ms. Sardo that there are  other genes that are associated with increased cancer risk that can be analyzed. Should Ms. Jaskolski wish to pursue additional genetic testing, we are happy to discuss and coordinate this testing, at any time.    CANCER SCREENING RECOMMENDATIONS: Ms. Boline test result is considered negative (normal).  This means that we have not identified a hereditary cause for her personal and family history of cancer at this time. Most cancers happen by chance and this negative test suggests that her cancer may fall into this category.    While reassuring, this does not definitively rule out a hereditary predisposition to cancer. It is still possible that there could be genetic mutations that are undetectable by current technology. There could be genetic mutations in genes that have not been tested or identified to increase cancer risk.  Therefore, it is recommended she continue to follow the cancer management and screening guidelines provided by her oncology and primary healthcare provider.   An individual's cancer risk and medical management are not determined by genetic test results alone. Overall cancer risk assessment incorporates additional factors, including personal medical history, family history, and any available genetic information that may result in a  personalized plan for cancer prevention and surveillance  RECOMMENDATIONS FOR FAMILY MEMBERS:  Individuals in this family might be at some increased risk of developing cancer, over the general population risk, simply due to the family history of cancer.  We recommended women in this family have a yearly mammogram beginning at age 62, or 52 years younger than the earliest onset of cancer, an annual clinical breast exam, and perform monthly breast self-exams. Women in this family should also have a gynecological exam as recommended by their primary provider. All family members should have a colonoscopy by age 60.  FOLLOW-UP: Lastly, we discussed with Ms. Geffert that cancer genetics is a rapidly advancing field and it is possible that new genetic tests will be appropriate for her and/or her family members in the future. We encouraged her to remain in contact with cancer genetics on an annual basis so we can update her personal and family histories and let her know of advances in cancer genetics that may benefit this family.   Our contact number was provided. Ms. Cortez questions were answered to her satisfaction, and she knows she is welcome to call us at anytime with additional questions or concerns.   Roma Kayser, MS, [facility] Certified Genetic Counselor [email or web address]

## 2018-11-18 ENCOUNTER — Other Ambulatory Visit: Payer: Self-pay | Admitting: General Surgery

## 2018-11-18 DIAGNOSIS — Z9882 Breast implant status: Secondary | ICD-10-CM | POA: Diagnosis not present

## 2018-11-18 DIAGNOSIS — E0590 Thyrotoxicosis, unspecified without thyrotoxic crisis or storm: Secondary | ICD-10-CM | POA: Diagnosis not present

## 2018-11-18 DIAGNOSIS — C50212 Malignant neoplasm of upper-inner quadrant of left female breast: Secondary | ICD-10-CM | POA: Diagnosis not present

## 2018-11-18 DIAGNOSIS — E785 Hyperlipidemia, unspecified: Secondary | ICD-10-CM | POA: Diagnosis not present

## 2018-11-18 DIAGNOSIS — Z803 Family history of malignant neoplasm of breast: Secondary | ICD-10-CM | POA: Diagnosis not present

## 2018-11-18 DIAGNOSIS — D0511 Intraductal carcinoma in situ of right breast: Secondary | ICD-10-CM | POA: Diagnosis not present

## 2018-11-18 DIAGNOSIS — Z170 Estrogen receptor positive status [ER+]: Secondary | ICD-10-CM

## 2018-11-19 ENCOUNTER — Other Ambulatory Visit: Payer: Self-pay | Admitting: General Surgery

## 2018-11-19 DIAGNOSIS — C50212 Malignant neoplasm of upper-inner quadrant of left female breast: Secondary | ICD-10-CM

## 2018-11-19 DIAGNOSIS — Z170 Estrogen receptor positive status [ER+]: Secondary | ICD-10-CM

## 2018-11-25 DIAGNOSIS — E041 Nontoxic single thyroid nodule: Secondary | ICD-10-CM | POA: Diagnosis not present

## 2018-11-25 DIAGNOSIS — C50912 Malignant neoplasm of unspecified site of left female breast: Secondary | ICD-10-CM | POA: Diagnosis not present

## 2018-11-25 DIAGNOSIS — E0590 Thyrotoxicosis, unspecified without thyrotoxic crisis or storm: Secondary | ICD-10-CM | POA: Diagnosis not present

## 2018-11-25 DIAGNOSIS — E0500 Thyrotoxicosis with diffuse goiter without thyrotoxic crisis or storm: Secondary | ICD-10-CM | POA: Diagnosis not present

## 2018-11-25 DIAGNOSIS — Z6827 Body mass index (BMI) 27.0-27.9, adult: Secondary | ICD-10-CM | POA: Diagnosis not present

## 2018-11-26 ENCOUNTER — Other Ambulatory Visit: Payer: PPO

## 2018-12-06 ENCOUNTER — Encounter (HOSPITAL_COMMUNITY): Payer: PPO

## 2018-12-12 ENCOUNTER — Other Ambulatory Visit: Payer: Self-pay | Admitting: General Surgery

## 2018-12-23 DIAGNOSIS — E0590 Thyrotoxicosis, unspecified without thyrotoxic crisis or storm: Secondary | ICD-10-CM | POA: Diagnosis not present

## 2018-12-26 DIAGNOSIS — E041 Nontoxic single thyroid nodule: Secondary | ICD-10-CM | POA: Diagnosis not present

## 2018-12-26 DIAGNOSIS — C50912 Malignant neoplasm of unspecified site of left female breast: Secondary | ICD-10-CM | POA: Diagnosis not present

## 2018-12-26 DIAGNOSIS — Z6827 Body mass index (BMI) 27.0-27.9, adult: Secondary | ICD-10-CM | POA: Diagnosis not present

## 2018-12-26 DIAGNOSIS — E0500 Thyrotoxicosis with diffuse goiter without thyrotoxic crisis or storm: Secondary | ICD-10-CM | POA: Diagnosis not present

## 2018-12-26 DIAGNOSIS — E0590 Thyrotoxicosis, unspecified without thyrotoxic crisis or storm: Secondary | ICD-10-CM | POA: Diagnosis not present

## 2018-12-26 NOTE — Pre-Procedure Instructions (Signed)
NOLENE ROCKS  12/26/2018      [facility] - [place], [place] [address] Phone: [phone] Fax: [phone]    Your procedure is scheduled on June 2  Report to [facility] Admitting at 6:00 A.M.  Call this number if you have problems the morning of surgery:  [phone]   Remember:  Do not eat after midnight.  You may drink clear liquids until 5:00A.M..  Clear liquids allowed are:                    Water, Juice (non-citric and without pulp), Carbonated beverages, Clear Tea, Black Coffee only, Plain Jell-O only, Gatorade and Plain Popsicles only    Take these medicines the morning of surgery with A SIP OF WATER :              anastrozole (arimidex)             Methimazole (tapazole)             Sertraline (zoloft)    7 days prior to surgery STOP taking any Aspirin (unless otherwise instructed by your surgeon), Aleve, Naproxen, Ibuprofen, Motrin, Advil, Goody's, BC's, all herbal medications, fish oil, and all vitamins.    Do not wear jewelry, make-up or nail polish.  Do not wear lotions, powders, or perfumes, or deodorant.  Do not shave 48 hours prior to surgery.  Men may shave face and neck.  Do not bring valuables to the hospital.  [facility] is not responsible for any belongings or valuables.  Contacts, dentures or bridgework may not be worn into surgery.  Leave your suitcase in the car.  After surgery it may be brought to your room.  For patients admitted to the hospital, discharge time will be determined by your treatment team.  Patients discharged the day of surgery will not be allowed to drive home.   Name and phone number of your driver:   Special instructions:   [facility]- Preparing For Surgery  Before surgery, you can play an important role. Because skin is not sterile, your skin needs to be as free of germs as possible. You can reduce the number of germs on your skin by washing  with CHG (chlorahexidine gluconate) Soap before surgery.  CHG is an antiseptic cleaner which kills germs and bonds with the skin to continue killing germs even after washing.    Oral Hygiene is also important to reduce your risk of infection.  Remember - BRUSH YOUR TEETH THE MORNING OF SURGERY WITH YOUR REGULAR TOOTHPASTE  Please do not use if you have an allergy to CHG or antibacterial soaps. If your skin becomes reddened/irritated stop using the CHG.  Do not shave (including legs and underarms) for at least 48 hours prior to first CHG shower. It is OK to shave your face.  Please follow these instructions carefully.   1. Shower the NIGHT BEFORE SURGERY and the MORNING OF SURGERY with CHG.   2. If you chose to wash your hair, wash your hair first as usual with your normal shampoo.  3. After you shampoo, rinse your hair and body thoroughly to remove the shampoo.  4. Use CHG as you would any other liquid soap. You can apply CHG directly to the skin and wash gently with a scrungie or a clean washcloth.   5. Apply the CHG Soap to your body ONLY FROM THE NECK  DOWN.  Do not use on open wounds or open sores. Avoid contact with your eyes, ears, mouth and genitals (private parts). Wash Face and genitals (private parts)  with your normal soap.  6. Wash thoroughly, paying special attention to the area where your surgery will be performed.  7. Thoroughly rinse your body with warm water from the neck down.  8. DO NOT shower/wash with your normal soap after using and rinsing off the CHG Soap.  9. Pat yourself dry with a CLEAN TOWEL.  10. Wear CLEAN PAJAMAS to bed the night before surgery, wear comfortable clothes the morning of surgery  11. Place CLEAN SHEETS on your bed the night of your first shower and DO NOT SLEEP WITH PETS.    Day of Surgery:  Do not apply any deodorants/lotions.  Please wear clean clothes to the hospital/surgery center.   Remember to brush your teeth WITH YOUR REGULAR  TOOTHPASTE.    Please read over the following fact sheets that you were given. Coughing and Deep Breathing and Surgical Site Infection Prevention

## 2018-12-27 ENCOUNTER — Encounter (HOSPITAL_COMMUNITY): Payer: Self-pay

## 2018-12-27 ENCOUNTER — Encounter (HOSPITAL_COMMUNITY)
Admission: RE | Admit: 2018-12-27 | Discharge: 2018-12-27 | Disposition: A | Discharge status: Home / Self Care | Payer: PPO | Source: Ambulatory Visit | Attending: General Surgery | Admitting: General Surgery

## 2018-12-27 ENCOUNTER — Other Ambulatory Visit: Payer: Self-pay

## 2018-12-27 DIAGNOSIS — Z01812 Encounter for preprocedural laboratory examination: Secondary | ICD-10-CM | POA: Insufficient documentation

## 2018-12-27 HISTORY — DX: Hypothyroidism, unspecified: E03.9

## 2018-12-27 LAB — BASIC METABOLIC PANEL
Anion gap: 9 (ref 5–15)
BUN: 16 mg/dL (ref 8–23)
CO2: 23 mmol/L (ref 22–32)
Calcium: 9.1 mg/dL (ref 8.9–10.3)
Chloride: 109 mmol/L (ref 98–111)
Creatinine, Ser: 0.73 mg/dL (ref 0.44–1.00)
GFR calc Af Amer: >60 mL/min (ref >60)
GFR calc non Af Amer: >60 mL/min (ref >60)
Glucose, Bld: 93 mg/dL (ref 70–99)
Potassium: 4.2 mmol/L (ref 3.5–5.1)
Sodium: 141 mmol/L (ref 135–145)

## 2018-12-27 LAB — CBC
HCT: 44.5 % (ref 36.0–46.0)
Hemoglobin: 14 g/dL (ref 12.0–15.0)
MCH: 28.2 pg (ref 26.0–34.0)
MCHC: 31.5 g/dL (ref 30.0–36.0)
MCV: 89.7 fL (ref 80.0–100.0)
Platelets: 246 10*3/uL (ref 150–400)
RBC: 4.96 MIL/uL (ref 3.87–5.11)
RDW: 13.7 % (ref 11.5–15.5)
WBC: 7.1 10*3/uL (ref 4.0–10.5)
nRBC: 0 % (ref 0.0–0.2)

## 2018-12-27 NOTE — Progress Notes (Signed)
PCP - [place]. Cardiologist - na  Chest x-ray - na EKG - na Stress Test - na ECHO - na Cardiac Cath - na  Sleep Study - na CPAP -   Fasting Blood Sugar - na Checks Blood Sugar _____ times a day  Blood Thinner Instructions:na Aspirin Instructions:  Anesthesia review:   Patient denies shortness of breath, fever, cough and chest pain at PAT appointment   Patient verbalized understanding of instructions that were given to them at the PAT appointment. Patient was also instructed that they will need to review over the PAT instructions again at home before surgery.

## 2018-12-31 ENCOUNTER — Telehealth: Payer: Self-pay | Admitting: Hematology

## 2018-12-31 NOTE — Telephone Encounter (Signed)
Cancelled 6/3 appt per sch msg due to surgery right before appt. Called and left msg with patient

## 2019-01-03 ENCOUNTER — Other Ambulatory Visit (HOSPITAL_COMMUNITY)
Admission: RE | Admit: 2019-01-03 | Discharge: 2019-01-03 | Disposition: A | Discharge status: Home / Self Care | Payer: PPO | Source: Ambulatory Visit | Attending: General Surgery | Admitting: General Surgery

## 2019-01-03 DIAGNOSIS — Z1159 Encounter for screening for other viral diseases: Secondary | ICD-10-CM | POA: Diagnosis not present

## 2019-01-04 LAB — NOVEL CORONAVIRUS, NAA (HOSP ORDER, SEND-OUT TO REF LAB; TAT 18-24 HRS): SARS-CoV-2, NAA: NOT DETECTED

## 2019-01-05 NOTE — H&P (Signed)
[name] Location: [facility] Patient #: [id number] DOB: November 13, 1950 Married / Language: English / Race: White Female       History of Present Illness      This is a 68 year old female who returns for another discussion and planning of definitive surgery for her left breast cancer. Dr. Burr Medico is her oncologist. Paula Compton is her gynecologist. Baldemar Friday, NP provides primary care. Imaging is at [facility]. Her endocrinologist is at [facility].     Past history is significant for right breast cancer in March 2016 Dr. Brantley Stage performed right lumpectomy and sentinel node biopsy. 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 Sentinel nodes negative. Genetic panel negative. She had radiation therapy and was treated by Dr. Burr Medico and Dr. Elvera Lennox.      She has newly diagnosed cancer of the left breast and chose to see me because I performed her mother's mastectomy. She has bilateral subpectoral implants Recent imaging showed a spiculated mass in the left breast upper inner quadrant somewhat medially. Ultrasound showed 2 small adjacent management masses, subcentimeter size, 7 mm apart, 4 cm from the nipple. Right lumpectomy looks stable. Left axilla was negative. Image guided biopsy showed invasive lobular carcinoma. Receptor strongly positive. HER-2 negative. Retropectoral implants were noted. We went ahead with MRI and fortunately this showed a 16 mm solitary mass in the left breast upper inner quadrant. No adenopathy. No other evidence of malignancy on either side. Genetic testing was repeated and is negative She would like breast conservation if possible and I think she is a good candidate for that      She has seen Dr. Burr Medico. She has been started on anastrozole to begin her medical treatment until the Covid-19 virus pandemic settles down. I think we can plan to go ahead with surgery in June she likes that idea.      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 Hyperthyroidism on methimazole. Thyroid function tests were drawn this morning. Depression and anxiety, well controlled. Hyperlipidemia. Bilateral breast implants. Family history significant for breast cancer in her mother in her 47s. I performed her surgery. She is a survivor. The patient's father also was treated for breast cancer. He also had head and neck cancer. Maternal grandmother and maternal on both had pancreatic cancer. SH - husband was with her last time but not today. One son and one daughter. Denies tobacco. Never smoker. Five Alcohol beverages a week.   Plan: Continue anastrozole preop and postop Schedule surgery earliest opportunity in June, 2020 Surgical plan is inject blue dye left breast, left breast lumpectomy with RSL, left axillary deep SLN biopsy. We may or may not be able to do circumareolar hidden scar technique      Dr. Burr Medico requests ONCOTYPE DX on final surgical specimen and Mammoprint if lymph nodes positive Hopefully all of her genomic tests will be negative and she can avoid chemotherapy Adjuvant radiation therapy     I discussed the indications, details, techniques, and numerous risk of the surgery with her. She is very aware of this as she had surgery on the right side. She is aware of the risk of rupture and removal of implant, bleeding, infection, cosmetic deformity, reoperation for positive margins or multiple positive nodes, chronic pain, shoulder disability, arm swelling, arm numbness. She understands all of these issues. All questions are answered. She agrees with this plan.     Allergies  No  Known Allergies  Allergies Reconciled   Medication History  Biotin (5000MCG Capsule, Oral) Active. KlonoPIN (0.5MG Tablet, Oral) Active. Sertraline HCl (100MG Tablet, Oral daily) Active. methIMAzole (5MG Tablet,  Oral) Active. Simvastatin (40MG Tablet, Oral) Active. Multivitamin Women 50+ (Oral) Active. Anastrozole (1MG Tablet, Oral) Active. Medications Reconciled  Vitals  Weight: 149 lb Height: 60in Body Surface Area: 1.65 m Body Mass Index: 29.1 kg/m  Temp.: 98.63F(Temporal)  Pulse: 95 (Regular)  BP: 158/78 (Sitting, Left Arm, Standard      Physical Exam General Mental Status-Alert. General Appearance-Not in acute distress. Build & Nutrition-Well nourished. Posture-Normal posture. Gait-Normal.  Head and Neck Head-normocephalic, atraumatic with no lesions or palpable masses. Trachea-midline. Thyroid Gland Characteristics - normal size and consistency and no palpable nodules.  Chest and Lung Exam Chest and lung exam reveals -on auscultation, normal breath sounds, no adventitious sounds and normal vocal resonance.  Breast Note: Breasts are moderately large. Symmetrical. Bilateral subpectoral implants palpable. Right breast reveals transverse lumpectomy scar at 12:00 well-healed. Minimal thickening no other skin changes or masses on the right. No axillary adenopathy on the right. Left breast reveals ecchymoses have resolved. Minimal thickening just outside the areolar margin at the 9:30 position. No other skin changes. No axillary adenopathy on the left.   Cardiovascular Cardiovascular examination reveals -normal heart sounds, regular rate and rhythm with no murmurs and femoral artery auscultation bilaterally reveals normal pulses, no bruits, no thrills.  Abdomen Inspection Inspection of the abdomen reveals - No Hernias. Palpation/Percussion Palpation and Percussion of the abdomen reveal - Soft, Non Tender, No Rigidity (guarding), No hepatosplenomegaly and No Palpable abdominal masses.  Neurologic Neurologic evaluation reveals -alert and oriented x 3 with no impairment of recent or remote memory, normal attention span and ability to  concentrate, normal sensation and normal coordination.  Musculoskeletal Normal Exam - Bilateral-Upper Extremity Strength Normal and Lower Extremity Strength Normal.    Assessment & Plan  PRIMARY CANCER OF UPPER INNER QUADRANT OF LEFT FEMALE BREAST (C50.212)   You're imaging studies show either too small cancers in the upper inner quadrant of the left breast or one cancer that is 1.6 cm diameter. Biopsies showed invasive lobular carcinoma, 4 cm from the nipple. The tumor is estrogen and progesterone receptor positive and HER-2 negative  your MRI shows this to be a solitary cancer and all of the lymph nodes looked normal This means that you are a candidate for breast conservation if you choose You have stated that he would like to keep your abreast if you can, and I think you are an excellent candidate for that  You have been started on anastrozole to treat the cancer for now. Because of the Covid-19 viral pandemic we're going to hold off on the surgery until early June when it should be a little bit safer for you  In early June you will be scheduled for left breast lumpectomy with radioactive seed localization, inject blue dye left breast, and left axillary sentinel lymph node biopsy I have discussed the indications, techniques, and risk of the surgery with you in detail  DCIS (DUCTAL CARCINOMA IN SITU), RIGHT (D05.11) PRESENCE OF BILATERAL BREAST IMPLANT (Z98.82) HYPERTHYROIDISM (E05.90) FAMILY HISTORY OF BREAST CANCER (Z80.3) HYPERLIPIDEMIA, MILD (E78.5)    Kenyetta Fife M. Dalbert Batman, M.D., [facility] General and Minimally invasive Surgery Breast and Colorectal Surgery Office:   [phone] Pager:   [phone]

## 2019-01-06 ENCOUNTER — Ambulatory Visit
Admission: RE | Admit: 2019-01-06 | Discharge: 2019-01-06 | Disposition: A | Discharge status: Home / Self Care | Payer: PPO | Source: Ambulatory Visit | Attending: General Surgery | Admitting: General Surgery

## 2019-01-06 ENCOUNTER — Ambulatory Visit (HOSPITAL_COMMUNITY): Payer: PPO

## 2019-01-06 ENCOUNTER — Other Ambulatory Visit: Payer: Self-pay

## 2019-01-06 DIAGNOSIS — Z170 Estrogen receptor positive status [ER+]: Secondary | ICD-10-CM

## 2019-01-06 DIAGNOSIS — C50912 Malignant neoplasm of unspecified site of left female breast: Secondary | ICD-10-CM | POA: Diagnosis not present

## 2019-01-06 DIAGNOSIS — C50212 Malignant neoplasm of upper-inner quadrant of left female breast: Secondary | ICD-10-CM

## 2019-01-06 DIAGNOSIS — C50919 Malignant neoplasm of unspecified site of unspecified female breast: Secondary | ICD-10-CM

## 2019-01-06 HISTORY — DX: Malignant neoplasm of unspecified site of unspecified female breast: C50.919

## 2019-01-06 NOTE — Anesthesia Preprocedure Evaluation (Addendum)
Anesthesia Evaluation  Patient identified by MRN, date of birth, ID band Patient awake    Reviewed: Allergy & Precautions, NPO status , Patient's Chart, lab work & pertinent test results  Airway Mallampati: II  TM Distance: <3 FB Neck ROM: Full    Dental no notable dental hx. (+) Teeth Intact, Dental Advisory Given   Pulmonary neg pulmonary ROS,    Pulmonary exam normal breath sounds clear to auscultation       Cardiovascular negative cardio ROS Normal cardiovascular exam Rhythm:Regular Rate:Normal     Neuro/Psych PSYCHIATRIC DISORDERS negative neurological ROS     GI/Hepatic negative GI ROS, Neg liver ROS,   Endo/Other  Hypothyroidism   Renal/GU negative Renal ROS     Musculoskeletal negative musculoskeletal ROS (+)   Abdominal   Peds  Hematology HLD   Anesthesia Other Findings LEFT BREAST CANCER  Reproductive/Obstetrics                           Anesthesia Physical Anesthesia Plan  ASA: II  Anesthesia Plan: General and Regional   Post-op Pain Management: GA combined w/ Regional for post-op pain   Induction: Intravenous  PONV Risk Score and Plan: 3 and Ondansetron, Dexamethasone, Midazolam and Treatment may vary due to age or medical condition  Airway Management Planned: LMA  Additional Equipment:   Intra-op Plan:   Post-operative Plan: Extubation in OR  Informed Consent: I have reviewed the patients History and Physical, chart, labs and discussed the procedure including the risks, benefits and alternatives for the proposed anesthesia with the patient or authorized representative who has indicated his/her understanding and acceptance.     Dental advisory given  Plan Discussed with: CRNA  Anesthesia Plan Comments:        Anesthesia Quick Evaluation

## 2019-01-07 ENCOUNTER — Ambulatory Visit (HOSPITAL_COMMUNITY): Payer: PPO | Admitting: Vascular Surgery

## 2019-01-07 ENCOUNTER — Ambulatory Visit (HOSPITAL_COMMUNITY): Payer: PPO | Admitting: Anesthesiology

## 2019-01-07 ENCOUNTER — Ambulatory Visit (HOSPITAL_BASED_OUTPATIENT_CLINIC_OR_DEPARTMENT_OTHER)
Admission: RE | Admit: 2019-01-07 | Discharge: 2019-01-07 | Disposition: A | Discharge status: Home / Self Care | Payer: PPO | Attending: General Surgery | Admitting: General Surgery

## 2019-01-07 ENCOUNTER — Ambulatory Visit
Admission: RE | Admit: 2019-01-07 | Discharge: 2019-01-07 | Disposition: A | Discharge status: Home / Self Care | Payer: PPO | Source: Ambulatory Visit | Attending: General Surgery | Admitting: General Surgery

## 2019-01-07 ENCOUNTER — Ambulatory Visit (HOSPITAL_COMMUNITY)
Admission: RE | Admit: 2019-01-07 | Discharge: 2019-01-07 | Disposition: A | Discharge status: Home / Self Care | Payer: PPO | Source: Ambulatory Visit | Attending: General Surgery | Admitting: General Surgery

## 2019-01-07 ENCOUNTER — Other Ambulatory Visit: Payer: Self-pay

## 2019-01-07 ENCOUNTER — Encounter (HOSPITAL_COMMUNITY)
Admission: RE | Disposition: A | Discharge status: Home / Self Care | Payer: Self-pay | Source: Home / Self Care | Attending: General Surgery

## 2019-01-07 ENCOUNTER — Encounter (HOSPITAL_COMMUNITY): Payer: Self-pay

## 2019-01-07 DIAGNOSIS — Z170 Estrogen receptor positive status [ER+]: Secondary | ICD-10-CM

## 2019-01-07 DIAGNOSIS — E0590 Thyrotoxicosis, unspecified without thyrotoxic crisis or storm: Secondary | ICD-10-CM | POA: Insufficient documentation

## 2019-01-07 DIAGNOSIS — Z853 Personal history of malignant neoplasm of breast: Secondary | ICD-10-CM | POA: Insufficient documentation

## 2019-01-07 DIAGNOSIS — Z800 Family history of malignant neoplasm of digestive organs: Secondary | ICD-10-CM

## 2019-01-07 DIAGNOSIS — E785 Hyperlipidemia, unspecified: Secondary | ICD-10-CM | POA: Diagnosis not present

## 2019-01-07 DIAGNOSIS — Z923 Personal history of irradiation: Secondary | ICD-10-CM | POA: Diagnosis not present

## 2019-01-07 DIAGNOSIS — C50912 Malignant neoplasm of unspecified site of left female breast: Secondary | ICD-10-CM | POA: Diagnosis not present

## 2019-01-07 DIAGNOSIS — Z79899 Other long term (current) drug therapy: Secondary | ICD-10-CM | POA: Diagnosis not present

## 2019-01-07 DIAGNOSIS — F329 Major depressive disorder, single episode, unspecified: Secondary | ICD-10-CM | POA: Diagnosis not present

## 2019-01-07 DIAGNOSIS — C50211 Malignant neoplasm of upper-inner quadrant of right female breast: Secondary | ICD-10-CM | POA: Diagnosis not present

## 2019-01-07 DIAGNOSIS — Z803 Family history of malignant neoplasm of breast: Secondary | ICD-10-CM | POA: Diagnosis not present

## 2019-01-07 DIAGNOSIS — C50212 Malignant neoplasm of upper-inner quadrant of left female breast: Secondary | ICD-10-CM

## 2019-01-07 DIAGNOSIS — F419 Anxiety disorder, unspecified: Secondary | ICD-10-CM | POA: Diagnosis not present

## 2019-01-07 DIAGNOSIS — G8918 Other acute postprocedural pain: Secondary | ICD-10-CM | POA: Diagnosis not present

## 2019-01-07 HISTORY — PX: BREAST LUMPECTOMY WITH RADIOACTIVE SEED AND SENTINEL LYMPH NODE BIOPSY: SHX6550

## 2019-01-07 SURGERY — BREAST LUMPECTOMY WITH RADIOACTIVE SEED AND SENTINEL LYMPH NODE BIOPSY
Anesthesia: Regional | Site: Breast

## 2019-01-07 MED ORDER — PROPOFOL 10 MG/ML IV BOLUS
INTRAVENOUS | Status: AC
  Filled 2019-01-07: qty 20

## 2019-01-07 MED ORDER — SODIUM CHLORIDE (PF) 0.9 % IJ SOLN
INTRAMUSCULAR | Status: AC
  Filled 2019-01-07: qty 10

## 2019-01-07 MED ORDER — FENTANYL CITRATE (PF) 100 MCG/2ML IJ SOLN
INTRAMUSCULAR | Status: AC
  Filled 2019-01-07: qty 2

## 2019-01-07 MED ORDER — HYDROCODONE-ACETAMINOPHEN 5-325 MG PO TABS: 1.0000 | ORAL_TABLET | Freq: Four times a day (QID) | ORAL | 0 refills | Status: DC | PRN

## 2019-01-07 MED ORDER — CELECOXIB 200 MG PO CAPS
200.0000 mg | ORAL_CAPSULE | ORAL | Status: AC
  Administered 2019-01-07: 200 mg via ORAL

## 2019-01-07 MED ORDER — ONDANSETRON HCL 4 MG/2ML IJ SOLN
INTRAMUSCULAR | Status: AC
  Filled 2019-01-07: qty 2

## 2019-01-07 MED ORDER — EPHEDRINE SULFATE-NACL 50-0.9 MG/10ML-% IV SOSY
PREFILLED_SYRINGE | INTRAVENOUS | Status: DC | PRN
  Administered 2019-01-07 (×2): 5 mg via INTRAVENOUS

## 2019-01-07 MED ORDER — CHLORHEXIDINE GLUCONATE CLOTH 2 % EX PADS: 6.0000 | MEDICATED_PAD | Freq: Once | CUTANEOUS | Status: DC

## 2019-01-07 MED ORDER — BUPIVACAINE-EPINEPHRINE (PF) 0.25% -1:200000 IJ SOLN
INTRAMUSCULAR | Status: AC
  Filled 2019-01-07: qty 30

## 2019-01-07 MED ORDER — ONDANSETRON HCL 4 MG/2ML IJ SOLN
INTRAMUSCULAR | Status: DC | PRN
  Administered 2019-01-07: 4 mg via INTRAVENOUS

## 2019-01-07 MED ORDER — DEXAMETHASONE SODIUM PHOSPHATE 10 MG/ML IJ SOLN
INTRAMUSCULAR | Status: AC
  Filled 2019-01-07: qty 1

## 2019-01-07 MED ORDER — CEFAZOLIN SODIUM-DEXTROSE 2-4 GM/100ML-% IV SOLN
INTRAVENOUS | Status: AC
  Filled 2019-01-07: qty 100

## 2019-01-07 MED ORDER — ACETAMINOPHEN 500 MG PO TABS
1000.0000 mg | ORAL_TABLET | ORAL | Status: AC
  Administered 2019-01-07: 1000 mg via ORAL

## 2019-01-07 MED ORDER — PROPOFOL 10 MG/ML IV BOLUS
INTRAVENOUS | Status: DC | PRN
  Administered 2019-01-07: 150 mg via INTRAVENOUS
  Administered 2019-01-07: 50 mg via INTRAVENOUS

## 2019-01-07 MED ORDER — MIDAZOLAM HCL 2 MG/2ML IJ SOLN
INTRAMUSCULAR | Status: AC
  Filled 2019-01-07: qty 2

## 2019-01-07 MED ORDER — CELECOXIB 200 MG PO CAPS
ORAL_CAPSULE | ORAL | Status: AC
  Administered 2019-01-07: 200 mg via ORAL
  Filled 2019-01-07: qty 1

## 2019-01-07 MED ORDER — LIDOCAINE HCL (CARDIAC) PF 100 MG/5ML IV SOSY
PREFILLED_SYRINGE | INTRAVENOUS | Status: DC | PRN
  Administered 2019-01-07: 40 mg via INTRAVENOUS

## 2019-01-07 MED ORDER — SODIUM CHLORIDE (PF) 0.9 % IJ SOLN
INTRAVENOUS | Status: DC | PRN
  Administered 2019-01-07: 5 mL via INTRAMUSCULAR

## 2019-01-07 MED ORDER — ONDANSETRON HCL 4 MG/2ML IJ SOLN: 4.0000 mg | Freq: Once | INTRAMUSCULAR | Status: DC | PRN

## 2019-01-07 MED ORDER — TECHNETIUM TC 99M SULFUR COLLOID FILTERED
1.0000 | Freq: Once | INTRAVENOUS | Status: AC | PRN
  Administered 2019-01-07: 1 via INTRADERMAL

## 2019-01-07 MED ORDER — FENTANYL CITRATE (PF) 100 MCG/2ML IJ SOLN
INTRAMUSCULAR | Status: DC | PRN
  Administered 2019-01-07 (×3): 50 ug via INTRAVENOUS

## 2019-01-07 MED ORDER — LACTATED RINGERS IV SOLN
INTRAVENOUS | Status: DC
  Administered 2019-01-07: 07:00:00 via INTRAVENOUS

## 2019-01-07 MED ORDER — SODIUM CHLORIDE 0.9% FLUSH: 3.0000 mL | Freq: Two times a day (BID) | INTRAVENOUS | Status: DC

## 2019-01-07 MED ORDER — CEFAZOLIN SODIUM-DEXTROSE 2-4 GM/100ML-% IV SOLN
2.0000 g | INTRAVENOUS | Status: AC
  Administered 2019-01-07: 08:00:00 2 g via INTRAVENOUS

## 2019-01-07 MED ORDER — MIDAZOLAM HCL 2 MG/2ML IJ SOLN
2.0000 mg | Freq: Once | INTRAMUSCULAR | Status: AC
  Administered 2019-01-07: 2 mg via INTRAVENOUS

## 2019-01-07 MED ORDER — FENTANYL CITRATE (PF) 100 MCG/2ML IJ SOLN: 25.0000 ug | INTRAMUSCULAR | Status: DC | PRN

## 2019-01-07 MED ORDER — ROPIVACAINE HCL 7.5 MG/ML IJ SOLN
INTRAMUSCULAR | Status: DC | PRN
  Administered 2019-01-07: 20 mL via PERINEURAL

## 2019-01-07 MED ORDER — GABAPENTIN 300 MG PO CAPS
300.0000 mg | ORAL_CAPSULE | ORAL | Status: AC
  Administered 2019-01-07: 07:00:00 300 mg via ORAL

## 2019-01-07 MED ORDER — 0.9 % SODIUM CHLORIDE (POUR BTL) OPTIME
TOPICAL | Status: DC | PRN
  Administered 2019-01-07: 1000 mL

## 2019-01-07 MED ORDER — MIDAZOLAM HCL 2 MG/2ML IJ SOLN
INTRAMUSCULAR | Status: AC
  Administered 2019-01-07: 2 mg via INTRAVENOUS
  Filled 2019-01-07: qty 2

## 2019-01-07 MED ORDER — BUPIVACAINE-EPINEPHRINE 0.25% -1:200000 IJ SOLN
INTRAMUSCULAR | Status: DC | PRN
  Administered 2019-01-07: 4 mL
  Administered 2019-01-07: 3 mL

## 2019-01-07 MED ORDER — LIDOCAINE 2% (20 MG/ML) 5 ML SYRINGE
INTRAMUSCULAR | Status: AC
  Filled 2019-01-07: qty 5

## 2019-01-07 MED ORDER — EPHEDRINE 5 MG/ML INJ
INTRAVENOUS | Status: AC
  Filled 2019-01-07: qty 10

## 2019-01-07 MED ORDER — METHYLENE BLUE 0.5 % INJ SOLN
INTRAVENOUS | Status: AC
  Filled 2019-01-07: qty 10

## 2019-01-07 MED ORDER — FENTANYL CITRATE (PF) 250 MCG/5ML IJ SOLN
INTRAMUSCULAR | Status: AC
  Filled 2019-01-07: qty 5

## 2019-01-07 MED ORDER — ACETAMINOPHEN 500 MG PO TABS
ORAL_TABLET | ORAL | Status: AC
  Administered 2019-01-07: 1000 mg via ORAL
  Filled 2019-01-07: qty 2

## 2019-01-07 MED ORDER — GABAPENTIN 300 MG PO CAPS
ORAL_CAPSULE | ORAL | Status: AC
  Administered 2019-01-07: 300 mg via ORAL
  Filled 2019-01-07: qty 1

## 2019-01-07 MED ORDER — DEXAMETHASONE SODIUM PHOSPHATE 10 MG/ML IJ SOLN
INTRAMUSCULAR | Status: DC | PRN
  Administered 2019-01-07: 10 mg via INTRAVENOUS

## 2019-01-07 SURGICAL SUPPLY — 43 items
ADH SKN CLS APL DERMABOND .7 (GAUZE/BANDAGES/DRESSINGS) ×1
APL PRP STRL LF DISP 70% ISPRP (MISCELLANEOUS) ×1
APPLIER CLIP 9.375 MED OPEN (MISCELLANEOUS) ×2
APR CLP MED 9.3 20 MLT OPN (MISCELLANEOUS) ×1
BINDER BREAST XLRG (GAUZE/BANDAGES/DRESSINGS) ×2 IMPLANT
CANISTER SUCT 3000ML PPV (MISCELLANEOUS) ×2 IMPLANT
CHLORAPREP W/TINT 26 (MISCELLANEOUS) ×2 IMPLANT
CLIP APPLIE 9.375 MED OPEN (MISCELLANEOUS) ×1 IMPLANT
CONT SPEC 4OZ CLIKSEAL STRL BL (MISCELLANEOUS) ×6 IMPLANT
COVER PROBE W GEL 5X96 (DRAPES) ×4 IMPLANT
COVER SURGICAL LIGHT HANDLE (MISCELLANEOUS) ×2 IMPLANT
DERMABOND ADVANCED (GAUZE/BANDAGES/DRESSINGS) ×1
DERMABOND ADVANCED .7 DNX12 (GAUZE/BANDAGES/DRESSINGS) ×1 IMPLANT
DEVICE DUBIN SPECIMEN MAMMOGRA (MISCELLANEOUS) ×2 IMPLANT
DRAPE CHEST BREAST 15X10 FENES (DRAPES) ×2 IMPLANT
DRAPE HALF SHEET 40X57 (DRAPES) ×2 IMPLANT
ELECT CAUTERY BLADE 6.4 (BLADE) ×2 IMPLANT
ELECT REM PT RETURN 9FT ADLT (ELECTROSURGICAL) ×2
ELECTRODE REM PT RTRN 9FT ADLT (ELECTROSURGICAL) ×1 IMPLANT
FILTER STRAW FLUID ASPIR (MISCELLANEOUS) ×2 IMPLANT
GAUZE SPONGE 4X4 12PLY STRL (GAUZE/BANDAGES/DRESSINGS) ×2 IMPLANT
GLOVE BIOGEL PI IND STRL 6.5 (GLOVE) ×1 IMPLANT
GLOVE BIOGEL PI INDICATOR 6.5 (GLOVE) ×1
GLOVE EUDERMIC 7 POWDERFREE (GLOVE) ×2 IMPLANT
GLOVE SURG SS PI 6.0 STRL IVOR (GLOVE) ×2 IMPLANT
GLOVE SURG SS PI 6.5 STRL IVOR (GLOVE) ×2 IMPLANT
GOWN STRL REUS W/ TWL LRG LVL3 (GOWN DISPOSABLE) ×1 IMPLANT
GOWN STRL REUS W/ TWL XL LVL3 (GOWN DISPOSABLE) ×1 IMPLANT
GOWN STRL REUS W/TWL LRG LVL3 (GOWN DISPOSABLE) ×2
GOWN STRL REUS W/TWL XL LVL3 (GOWN DISPOSABLE) ×2
KIT BASIN OR (CUSTOM PROCEDURE TRAY) ×2 IMPLANT
KIT MARKER MARGIN INK (KITS) ×2 IMPLANT
NDL SAFETY ECLIPSE 18X1.5 (NEEDLE) ×1 IMPLANT
NEEDLE HYPO 18GX1.5 SHARP (NEEDLE) ×2
NEEDLE HYPO 25GX1X1/2 BEV (NEEDLE) ×4 IMPLANT
NS IRRIG 1000ML POUR BTL (IV SOLUTION) ×2 IMPLANT
PACK GENERAL/GYN (CUSTOM PROCEDURE TRAY) ×2 IMPLANT
PAD ABD 8X10 STRL (GAUZE/BANDAGES/DRESSINGS) ×2 IMPLANT
SPONGE LAP 4X18 RFD (DISPOSABLE) ×4 IMPLANT
SUT MNCRL AB 4-0 PS2 18 (SUTURE) ×4 IMPLANT
SUT SILK 2 0 SH (SUTURE) ×2 IMPLANT
SUT VIC AB 3-0 SH 18 (SUTURE) ×2 IMPLANT
SYR CONTROL 10ML LL (SYRINGE) ×2 IMPLANT

## 2019-01-07 NOTE — Anesthesia Procedure Notes (Addendum)
Anesthesia Regional Block: Pectoralis block   Pre-Anesthetic Checklist: ,, timeout performed, Correct Patient, Correct Site, Correct Laterality, Correct Procedure, Correct Position, site marked, Risks and benefits discussed,  Surgical consent,  Pre-op evaluation,  At surgeon's request and post-op pain management  Laterality: Left  Prep: chloraprep       Needles:  Injection technique: Single-shot  Needle Type: Echogenic Stimulator Needle     Needle Length: 9cm  Needle Gauge: 21     Additional Needles:   Procedures:,,,, ultrasound used (permanent image in chart),,,,  Narrative:  Start time: 01/07/2019 7:15 AM End time: 01/07/2019 7:25 AM Injection made incrementally with aspirations every 5 mL.  Performed by: Personally  Anesthesiologist: Murvin Natal, MD  Additional Notes: Functioning IV was confirmed and monitors were applied.  A timeout was performed. Sterile prep, hand hygiene and sterile gloves were used. A 59mm 21ga Arrow echogenic stimulator needle was used. Negative aspiration and negative test dose prior to incremental administration of local anesthetic. The patient tolerated the procedure well.  Ultrasound guidance: relevent anatomy identified, needle position confirmed, local anesthetic spread visualized around nerve(s), vascular puncture avoided.  Image printed for medical record.

## 2019-01-07 NOTE — Discharge Instructions (Signed)
[facility] °Office Phone Number [phone] ° °BREAST BIOPSY/ PARTIAL MASTECTOMY: POST OP INSTRUCTIONS ° °Always review your discharge instruction sheet given to you by the facility where your surgery was performed. ° °IF YOU HAVE DISABILITY OR FAMILY LEAVE FORMS, YOU MUST BRING THEM TO THE OFFICE FOR PROCESSING.  DO NOT GIVE THEM TO YOUR DOCTOR. ° °1. A prescription for pain medication may be given to you upon discharge.  Take your pain medication as prescribed, if needed.  If narcotic pain medicine is not needed, then you may take acetaminophen (Tylenol) or ibuprofen (Advil) as needed. °2. Take your usually prescribed medications unless otherwise directed °3. If you need a refill on your pain medication, please contact your pharmacy.  They will contact our office to request authorization.  Prescriptions will not be filled after 5pm or on week-ends. °4. You should eat very light the first 24 hours after surgery, such as soup, crackers, pudding, etc.  Resume your normal diet the day after surgery. °5. Most patients will experience some swelling and bruising in the breast.  Ice packs and a good support bra will help.  Swelling and bruising can take several days to resolve.  °6. It is common to experience some constipation if taking pain medication after surgery.  Increasing fluid intake and taking a stool softener will usually help or prevent this problem from occurring.  A mild laxative (Milk of Magnesia or Miralax) should be taken according to package directions if there are no bowel movements after 48 hours. °7. Unless discharge instructions indicate otherwise, you may remove your bandages 24-48 hours after surgery, and you may shower at that time.  You may have steri-strips (small skin tapes) in place directly over the incision.  These strips should be left on the skin for 7-10 days.  If your surgeon used skin glue on the incision, you may shower in 24 hours.  The glue will flake off over the  next 2-3 weeks.  Any sutures or staples will be removed at the office during your follow-up visit. °8. ACTIVITIES:  You may resume regular daily activities (gradually increasing) beginning the next day.  Wearing a good support bra or sports bra minimizes pain and swelling.  You may have sexual intercourse when it is comfortable. °a. You may drive when you no longer are taking prescription pain medication, you can comfortably wear a seatbelt, and you can safely maneuver your car and apply brakes. °b. RETURN TO WORK:  ______________________________________________________________________________________ °9. You should see your doctor in the office for a follow-up appointment approximately two weeks after your surgery.  Your doctor’s nurse will typically make your follow-up appointment when she calls you with your pathology report.  Expect your pathology report 2-3 business days after your surgery.  You may call to check if you do not hear from us after three days. °10. OTHER INSTRUCTIONS: _______________________________________________________________________________________________ _____________________________________________________________________________________________________________________________________ °_____________________________________________________________________________________________________________________________________ °_____________________________________________________________________________________________________________________________________ ° °WHEN TO CALL YOUR DOCTOR: °1. Fever over 101.0 °2. Nausea and/or vomiting. °3. Extreme swelling or bruising. °4. Continued bleeding from incision. °5. Increased pain, redness, or drainage from the incision. ° °The clinic staff is available to answer your questions during regular business hours.  Please don’t hesitate to call and ask to speak to one of the nurses for clinical concerns.  If you have a medical emergency, go to the nearest  emergency room or call 911.  A surgeon from [facility] is always on call at the hospital. ° °For further questions, please visit [email or web address]  ° ° ° ° ° ° ° ° ° ° °••••••••• ° ° °  Managing Your Pain After Surgery Without Opioids ° ° ° °Thank you for participating in our program to help patients manage their pain after surgery without opioids. This is part of our effort to provide you with the best care possible, without exposing you or your family to the risk that opioids pose. ° °What pain can I expect after surgery? °You can expect to have some pain after surgery. This is normal. The pain is typically worse the day after surgery, and quickly begins to get better. °Many studies have found that many patients are able to manage their pain after surgery with Over-the-Counter (OTC) medications such as Tylenol and Motrin. If you have a condition that does not allow you to take Tylenol or Motrin, notify your surgical team. ° °How will I manage my pain? °The best strategy for controlling your pain after surgery is around the clock pain control with Tylenol (acetaminophen) and Motrin (ibuprofen or Advil). Alternating these medications with each other allows you to maximize your pain control. In addition to Tylenol and Motrin, you can use heating pads or ice packs on your incisions to help reduce your pain. ° °How will I alternate your regular strength over-the-counter pain medication? °You will take a dose of pain medication every three hours. °; Start by taking 650 mg of Tylenol (2 pills of 325 mg) °; 3 hours later take 600 mg of Motrin (3 pills of 200 mg) °; 3 hours after taking the Motrin take 650 mg of Tylenol °; 3 hours after that take 600 mg of Motrin. ° ° °- 1 - ° °See example - if your first dose of Tylenol is at 12:00 PM ° ° °12:00 PM Tylenol 650 mg (2 pills of 325 mg)  °3:00 PM Motrin 600 mg (3 pills of 200 mg)  °6:00 PM Tylenol 650 mg (2 pills of 325 mg)  °9:00 PM Motrin 600 mg (3  pills of 200 mg)  °Continue alternating every 3 hours  ° °We recommend that you follow this schedule around-the-clock for at least 3 days after surgery, or until you feel that it is no longer needed. Use the table on the last page of this handout to keep track of the medications you are taking. °Important: °Do not take more than 3000mg of Tylenol or 3200mg of Motrin in a 24-hour period. °Do not take ibuprofen/Motrin if you have a history of bleeding stomach ulcers, severe kidney disease, &/or actively taking a blood thinner ° °What if I still have pain? °If you have pain that is not controlled with the over-the-counter pain medications (Tylenol and Motrin or Advil) you might have what we call “breakthrough” pain. You will receive a prescription for a small amount of an opioid pain medication such as Oxycodone, Tramadol, or Tylenol with Codeine. Use these opioid pills in the first 24 hours after surgery if you have breakthrough pain. Do not take more than 1 pill every 4-6 hours. ° °If you still have uncontrolled pain after using all opioid pills, don't hesitate to call our staff using the number provided. We will help make sure you are managing your pain in the best way possible, and if necessary, we can provide a prescription for additional pain medication. ° ° °Day 1   ° °Time  °Name of Medication Number of pills taken  °Amount of Acetaminophen  °Pain Level  ° °Comments  °AM PM       °AM PM       °AM PM       °  AM PM       °AM PM       °AM PM       °AM PM       °AM PM       °Total Daily amount of Acetaminophen °Do not take more than  3,000 mg per day    ° ° °Day 2   ° °Time  °Name of Medication Number of pills °taken  °Amount of Acetaminophen  °Pain Level  ° °Comments  °AM PM       °AM PM       °AM PM       °AM PM       °AM PM       °AM PM       °AM PM       °AM PM       °Total Daily amount of Acetaminophen °Do not take more than  3,000 mg per day    ° ° °Day 3   ° °Time  °Name of Medication Number of pills taken    °Amount of Acetaminophen  °Pain Level  ° °Comments  °AM PM       °AM PM       °AM PM       °AM PM       ° ° ° °AM PM       °AM PM       °AM PM       °AM PM       °Total Daily amount of Acetaminophen °Do not take more than  3,000 mg per day    ° ° °Day 4   ° °Time  °Name of Medication Number of pills taken  °Amount of Acetaminophen  °Pain Level  ° °Comments  °AM PM       °AM PM       °AM PM       °AM PM       °AM PM       °AM PM       °AM PM       °AM PM       °Total Daily amount of Acetaminophen °Do not take more than  3,000 mg per day    ° ° °Day 5   ° °Time  °Name of Medication Number °of pills taken  °Amount of Acetaminophen  °Pain Level  ° °Comments  °AM PM       °AM PM       °AM PM       °AM PM       °AM PM       °AM PM       °AM PM       °AM PM       °Total Daily amount of Acetaminophen °Do not take more than  3,000 mg per day    ° ° ° °Day 6   ° °Time  °Name of Medication Number of pills °taken  °Amount of Acetaminophen  °Pain Level  °Comments  °AM PM       °AM PM       °AM PM       °AM PM       °AM PM       °AM PM       °AM PM       °AM PM       °Total Daily amount of Acetaminophen °Do not take more than    3,000 mg per day    ° ° °Day 7   ° °Time  °Name of Medication Number of pills taken  °Amount of Acetaminophen  °Pain Level  ° °Comments  °AM PM       °AM PM       °AM PM       °AM PM       °AM PM       °AM PM       °AM PM       °AM PM       °Total Daily amount of Acetaminophen °Do not take more than  3,000 mg per day    ° ° ° ° °For additional information about how and where to safely dispose of unused opioid °medications - https://www.morepowerfulnc.org ° °Disclaimer: This document contains information and/or instructional materials adapted from [facility] for the typical patient with your condition. It does not replace medical advice from your health care provider because your experience may differ from that of the °typical patient. Talk to your health care provider if you have any questions about  this °document, your condition or your treatment plan. °Adapted from [facility] ° °

## 2019-01-07 NOTE — Transfer of Care (Signed)
Immediate Anesthesia Transfer of Care Note  Patient: [name]  Procedure(s) Performed: LEFT BREAST LUMPECTOMY WITH RADIOACTIVE SEED AND LEFT DEEP AXILLARY SENTINEL LYMPH NODE BIOPSY WITH BLUE DYE INJECTION (N/A Breast)  Patient Location: PACU  Anesthesia Type:General  Level of Consciousness: oriented, drowsy and patient cooperative  Airway & Oxygen Therapy: Patient Spontanous Breathing and Patient connected to face mask oxygen  Post-op Assessment: Report given to RN and Post -op Vital signs reviewed and stable  Post vital signs: Reviewed  Last Vitals:  Vitals Value Taken Time  BP 178/88 01/07/2019  9:45 AM  Temp    Pulse 97 01/07/2019  9:47 AM  Resp 13 01/07/2019  9:47 AM  SpO2 100 % 01/07/2019  9:47 AM  Vitals shown include unvalidated device data.  Last Pain:  Vitals:   01/07/19 0730  TempSrc:   PainSc: 0-No pain         Complications: No apparent anesthesia complications

## 2019-01-07 NOTE — Anesthesia Procedure Notes (Signed)
Procedure Name: LMA Insertion Date/Time: 01/07/2019 8:00 AM Performed by: Jenne Campus, CRNA Pre-anesthesia Checklist: Patient identified, Emergency Drugs available, Suction available and Patient being monitored Patient Re-evaluated:Patient Re-evaluated prior to induction Oxygen Delivery Method: Circle System Utilized Preoxygenation: Pre-oxygenation with 100% oxygen Induction Type: IV induction LMA: LMA inserted LMA Size: 4.0 Number of attempts: 1 Airway Equipment and Method: Bite block Placement Confirmation: positive ETCO2 and breath sounds checked- equal and bilateral Tube secured with: Tape Dental Injury: Teeth and Oropharynx as per pre-operative assessment

## 2019-01-07 NOTE — Op Note (Signed)
Patient Name:           [name]   Date of Surgery:        01/07/2019  Pre op Diagnosis:      Invasive lobular carcinoma left breast, upper inner quadrant, receptor positive                                      History right breast cancer                                       Bilateral retropectoral implants in place  Post op Diagnosis:    Same  Procedure:                 Inject blue dye left breast                                      Left breast lumpectomy with radioactive seed localization                                      Left axillary deep sentinel lymph node biopsy  Surgeon:                     Edsel Petrin. Dalbert Batman, M.D., FACS  Assistant:                      OR staff  Operative Indications:   This is a 68 year old female who returns for  definitive surgery for her left breast cancer. Dr. Burr Medico is her oncologist. Paula Compton is her gynecologist. Baldemar Friday, NP provides primary care. Imaging is at [facility]. Her endocrinologist is at [facility].     Past history is significant for right breast cancer in March 2016 Dr. Brantley Stage performed right lumpectomy and sentinel node biopsy. 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 Sentinel nodes negative. Genetic panel negative. She had radiation therapy and was treated by Dr. Burr Medico and Dr. Elvera Lennox.      She has newly diagnosed cancer of the left breast and chose to see me because I performed her mother's mastectomy. She has bilateral subpectoral implants Recent imaging showed a spiculated mass in the left breast upper inner quadrant somewhat medially. Ultrasound showed 2 small adjacent management masses, subcentimeter size, 7 mm apart, 4 cm from the nipple. Right lumpectomy looks stable. Left axilla was negative. Image guided biopsy showed invasive lobular carcinoma. Receptor strongly positive. HER-2 negative. Retropectoral implants were noted. We went ahead with MRI and fortunately this showed  only  a 16 mm solitary mass in the left breast upper inner quadrant. No adenopathy. No other evidence of malignancy on either side. Genetic testing was repeated and is negative She would like breast conservation if possible and I think she is a good candidate for that      She has seen Dr. Burr Medico. 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 Hyperthyroidism on methimazole. Thyroid function tests were drawn this morning. Depression and  anxiety, well controlled. Hyperlipidemia. Bilateral breast implants. Family history significant for breast cancer in her mother in her 12s. I performed her surgery. She is a survivor. The patient's father also was treated for breast cancer. He also had head and neck cancer. Maternal grandmother and maternal on both had pancreatic cancer. Surgical plan is inject blue dye left breast, left breast lumpectomy with RSL, left axillary deep SLN biopsy. We may or may not be able to do circumareolar hidden scar technique      Dr. Burr Medico requests ONCOTYPE DX on final surgical specimen and Mammoprint if lymph nodes positive Hopefully all of her genomic tests will be negative and she can avoid chemotherapy Adjuvant radiation therapy . She agrees with this plan.  Operative Findings:       The cancer was in the upper inner quadrant of the left breast but it was just outside the areolar margin.  We were able to use a circumareolar, hidden scar technique.  The specimen mammogram looked good and the seed and marker clip were immediately adjacent to each other within the center of the specimen.  The dissection was taken down all the way to the pectoralis fascia so the broad posterior margin is the pectoralis muscle.  I found 3 sentinel lymph nodes.  There was no apparent injury to the subpectoral  implant.  Procedure in Detail:          Following the induction of general LMA anesthesia a surgical timeout was performed.  Intravenous antibiotics were given.  Following alcohol prep I injected 5 cc of dilute methylene blue into the left breast, subareolar area and massaged the breast for a few minutes.  The entire left chest wall and axilla were then prepped and draped in a sterile fashion.  0.5% Marcaine with epinephrine was used as a local infiltration anesthetic but only into the superficial tissues.       Using the neoprobe I identified the radioactive seed in the upper inner quadrant about 1.5 cm peripheral to the areolar margin.  I made a circumareolar incision with a knife.  The lumpectomy was performed using electrocautery and the neoprobe.  I took the dissection all the way down to the pectoralis fascia.  Specimen was removed and marked with silk sutures and a 6 color ink kit.  The specimen mammogram looked very good as described above.  The specimen was sent to the lab where the seed was retrieved.  The wound was irrigated.  Hemostasis was excellent.  The breast tissues were closed in layers with interrupted sutures of 3-0 Vicryl and the skin closed with a running subcuticular 4-0 Monocryl and Dermabond.       I made a transverse incision in the left axilla at the hairline.  Dissection was carried down into the axilla dividing the clavipectoral fascia.  Using the neoprobe I isolated 3 sentinel lymph nodes which were hot and blue.  And that was all I found.  Hemostasis was excellent and achieved electrocautery as well as metal clips.  The wound was irrigated.  The clavipectoral fascia was closed with 3-0 Vicryl sutures and the skin closed with a running subcuticular 4-0 Monocryl and Dermabond.  Clean bandages and a breast binder were placed.  The patient tolerated the procedure well and was taken to PACU in stable condition.  EBL 30 cc.  Counts correct.  Complications none.    Addendum: I logged  onto the PMP aware website and reviewed her prescription medication history  Edsel Petrin. Dalbert Batman, M.D., FACS General and Minimally Invasive Surgery Breast and Colorectal Surgery  01/07/2019 9:40 AM

## 2019-01-07 NOTE — Interval H&P Note (Signed)
History and Physical Interval Note:  01/07/2019 5:53 AM  [name]  has presented today for surgery, with the diagnosis of LEFT BREAST CANCER.  The various methods of treatment have been discussed with the patient and family. After consideration of risks, benefits and other options for treatment, the patient has consented to  Procedure(s): LEFT BREAST LUMPECTOMY WITH RADIOACTIVE SEED AND LEFT DEEP AXILLARY SENTINEL LYMPH NODE BIOPSY WITH BLUE DYE INJECTION (N/A) as a surgical intervention.  The patient's history has been reviewed, patient examined, no change in status, stable for surgery.  I have reviewed the patient's chart and labs.  Questions were answered to the patient's satisfaction.     [name]

## 2019-01-07 NOTE — Anesthesia Postprocedure Evaluation (Signed)
Anesthesia Post Note  Patient: [name]  Procedure(s) Performed: LEFT BREAST LUMPECTOMY WITH RADIOACTIVE SEED AND LEFT DEEP AXILLARY SENTINEL LYMPH NODE BIOPSY WITH BLUE DYE INJECTION (N/A Breast)     Patient location during evaluation: PACU Anesthesia Type: Regional and General Level of consciousness: awake and alert Pain management: pain level controlled Vital Signs Assessment: post-procedure vital signs reviewed and stable Respiratory status: spontaneous breathing, nonlabored ventilation, respiratory function stable and patient connected to nasal cannula oxygen Cardiovascular status: blood pressure returned to baseline and stable Postop Assessment: no apparent nausea or vomiting Anesthetic complications: no    Last Vitals:  Vitals:   01/07/19 1015 01/07/19 1029  BP: 129/83 140/81  Pulse: 85 82  Resp: 15 16  Temp: (!) 36.3 C   SpO2: 93% 94%    Last Pain:  Vitals:   01/07/19 0730  TempSrc:   PainSc: 0-No pain                 Ryun Velez P Jedediah Noda

## 2019-01-08 ENCOUNTER — Ambulatory Visit: Payer: PPO | Admitting: Hematology

## 2019-01-08 ENCOUNTER — Encounter (HOSPITAL_COMMUNITY): Payer: Self-pay | Admitting: General Surgery

## 2019-01-09 NOTE — Progress Notes (Signed)
Inform patient of Pathology report,. Breast pathology shows that cancer completely resected with negative margin. All lymph nodes negative. Excellent news. She will not need any further surgery  Let me know.  Dalbert Batman

## 2019-01-10 ENCOUNTER — Telehealth: Payer: Self-pay | Admitting: *Deleted

## 2019-01-10 NOTE — Telephone Encounter (Signed)
Ordered oncotype per Dr. Feng.  Faxed requisition to pathology.   

## 2019-01-15 ENCOUNTER — Telehealth: Payer: Self-pay | Admitting: *Deleted

## 2019-01-15 NOTE — Telephone Encounter (Signed)
Received call from [employer] where Oncotype is done & they received order but no specimen & wanted to know if specimen had been requested.  Call back # is [phone].  Discussed with Dr Burr Medico & message routed to [facility], [facility].

## 2019-01-16 NOTE — Telephone Encounter (Signed)
Per [employer] specimen received on 01/15/19. Results expected to be released on 01/29/19.

## 2019-01-21 ENCOUNTER — Encounter: Payer: Self-pay | Admitting: Hematology

## 2019-01-21 DIAGNOSIS — C50211 Malignant neoplasm of upper-inner quadrant of right female breast: Secondary | ICD-10-CM | POA: Diagnosis not present

## 2019-01-21 DIAGNOSIS — Z170 Estrogen receptor positive status [ER+]: Secondary | ICD-10-CM | POA: Diagnosis not present

## 2019-01-22 ENCOUNTER — Telehealth: Payer: Self-pay | Admitting: Hematology

## 2019-01-22 NOTE — Telephone Encounter (Signed)
Spoke with patient re 6/22 [employer] video.

## 2019-01-24 ENCOUNTER — Telehealth: Payer: Self-pay | Admitting: Hematology

## 2019-01-24 NOTE — Telephone Encounter (Signed)
Called pt remind video call

## 2019-01-24 NOTE — Progress Notes (Signed)
Open in error

## 2019-01-27 ENCOUNTER — Inpatient Hospital Stay: Payer: PPO | Attending: Hematology | Admitting: Hematology

## 2019-01-27 ENCOUNTER — Encounter: Payer: Self-pay | Admitting: Hematology

## 2019-01-27 DIAGNOSIS — C50211 Malignant neoplasm of upper-inner quadrant of right female breast: Secondary | ICD-10-CM

## 2019-01-27 DIAGNOSIS — Z171 Estrogen receptor negative status [ER-]: Secondary | ICD-10-CM

## 2019-01-27 DIAGNOSIS — C50212 Malignant neoplasm of upper-inner quadrant of left female breast: Secondary | ICD-10-CM

## 2019-01-27 DIAGNOSIS — Z923 Personal history of irradiation: Secondary | ICD-10-CM

## 2019-01-27 DIAGNOSIS — Z170 Estrogen receptor positive status [ER+]: Secondary | ICD-10-CM

## 2019-01-27 MED ORDER — ONDANSETRON HCL 8 MG PO TABS: 8.0000 mg | ORAL_TABLET | Freq: Two times a day (BID) | ORAL | 1 refills | Status: DC | PRN

## 2019-01-27 MED ORDER — PROCHLORPERAZINE MALEATE 10 MG PO TABS: 10.0000 mg | ORAL_TABLET | Freq: Four times a day (QID) | ORAL | 1 refills | Status: DC | PRN

## 2019-01-27 MED ORDER — DEXAMETHASONE 4 MG PO TABS: 8.0000 mg | ORAL_TABLET | Freq: Two times a day (BID) | ORAL | 1 refills | Status: DC

## 2019-01-27 NOTE — Progress Notes (Signed)
[place]   [phone] [phone]   Clinic Follow up Note   Patient Care Team: Holland Commons, FNP as PCP - General (Internal Medicine) Erroll Luna, MD as Consulting Physician (General Surgery) [name], MD as Consulting Physician (Hematology) Thea Silversmith, MD as Consulting Physician (Radiation Oncology) Rockwell Germany, RN as Registered Nurse Mauro Kaufmann, RN as Registered Nurse Holley Bouche, NP (Inactive) as Nurse Practitioner (Nurse Practitioner) Sylvan Cheese, NP as Nurse Practitioner (Nurse Practitioner)  I connected with [name] on 01/27/2019 at 12:45 PM EDT by telephone and verified that I am speaking with the correct person using two identifiers.   I discussed the limitations, risks, security and privacy concerns of performing an evaluation and management service by telephone and the availability of in person appointments. I also discussed with the patient that there may be a patient responsible charge related to this service. The patient expressed understanding and agreed to proceed.   Other persons participating in the visit and their role in the encounter:  Granddaughter   Patient's location:  Home  Provider's location:  My Office   CHIEF COMPLAINT: F/u left breast cancer   SUMMARY OF ONCOLOGIC HISTORY: Oncology History Overview Note  Cancer Staging Breast cancer of upper-inner quadrant of right female breast [facility]) Staging form: Breast, AJCC 7th Edition - Clinical stage from 09/23/2014: Stage 0 (Tis (DCIS), N0, M0) - Unsigned - Pathologic stage from 10/22/2014: Stage 0 (Tis (DCIS), N0, cM0) - Signed by [name], MD on 11/03/2014 Staging comments: Staged on final lumpectomy specimen by Dr. [name]  Malignant neoplasm of upper-inner quadrant of left breast in female, estrogen receptor positive ([place]) Staging form: Breast, AJCC 8th Edition - Clinical: cT1b, cN0, cM0, G2, ER+, PR+ - Unsigned    Breast cancer of upper-inner quadrant of right female breast ([place])  09/11/2014 Mammogram   Right breast: area of pleomorphic calcifications and associated architectural distortion, with irregular density at the 1 o'clock position of the right breast. No other suspicious findings in the right breast.    09/11/2014 Breast US   Right breast: area of hypoechogenecity 2 cm x 0.9 cm x 1.6 cm in size, 12 o'clock position, 3 cm the nipple. No discrete suspicious mass.   09/11/2014 Initial Biopsy   Right breast needle core bx (11-12 o'clock, UOQ): DCIS, grade 3, with necrosis and calcifications, ER- (0%), PR- (0%)   09/11/2014 Pathologic Stage   Stage 0: Tis Nx    09/17/2014 Breast MRI   Non mass enhancement in the upper and slight outer right breast measuring approximately 2.6 x 1.7 x 2.2 cm. Biopsy proven fibrocystic changes in the upper outer periareolar right breast manifested as a 9 x 7 mm mass.   09/24/2014 Procedure   Genetic testing: BRCA plus and BreastNext panels ([place]) performed showing no clinically significant variants detected including ATM, BARD1, BRCA1, BRCA2, BRIP1, CDH1, CHEK2, MRE11A, MUTYH, NBN, NF1, PALB2, PTEN, RAD50, RAD51C, RAD51D, and TP53.   10/20/2014 Definitive Surgery   Right lumpectomy with SLNB ([place]): DCIS with necrosis and calcifications, grade 3, 1 mm from nearest margin (posterior), 0/2 LN with evidence of malignancy.   10/20/2014 Clinical Stage   Stage 0: Tis N0   11/25/2014 - 01/06/2015 Radiation Therapy   Adjuvant RT completed [name]): Right breast 50 Gy over 25 fractions; right breast boost 10 Gy over 5 fractions. Total dose received: 60 Gy.    04/13/2015 Survivorship   Survivorship care plan completed and copy mailed  to patient as she was unable to be seen in the Survivorship clinic due to patient's schedule.   11/12/2018 Genetic Testing   Negative genetic testing on the common hereditary cancer panel.  The Hereditary Gene Panel offered by [facility] includes  sequencing and/or deletion duplication testing of the following 48 genes: APC, ATM, AXIN2, BARD1, BMPR1A, BRCA1, BRCA2, BRIP1, CDH1, CDK4, CDKN2A (p14ARF), CDKN2A (p16INK4a), CHEK2, CTNNA1, DICER1, EPCAM (Deletion/duplication testing only), GREM1 (promoter region deletion/duplication testing only), KIT, MEN1, MLH1, MSH2, MSH3, MSH6, MUTYH, NBN, NF1, NHTL1, PALB2, PDGFRA, PMS2, POLD1, POLE, PTEN, RAD50, RAD51C, RAD51D, RNF43, SDHB, SDHC, SDHD, SMAD4, SMARCA4. STK11, TP53, TSC1, TSC2, and VHL.  The following genes were evaluated for sequence changes only: SDHA and HOXB13 c.251G>A variant only. The report date is 11/12/2018.  AXIN2 c.815+fdup ([employer]) VUS identified.  This is still a normal result and will not change medical management.   Malignant neoplasm of upper-inner quadrant of left breast in female, estrogen receptor positive ([place])  10/18/2018 Initial Diagnosis   Malignant neoplasm of upper-inner quadrant of left breast in female, estrogen receptor positive ([place])   10/18/2018 Initial Biopsy   Diagnosis Breast, left, needle core biopsy, 10 o'clock, 4 cm fn - INVASIVE MAMMARY CARCINOMA - MAMMARY CARCINOMA IN SITU - SEE COMMENT   10/18/2018 Receptors her2   ER: 100% with strong staining  PR: 80% with strong staining  HER2 Negative    10/18/2018 Imaging   Diagnostic Mammogram 10/18/18  IMPRESSION 2 hypoechoic masses in the left breast at 10 o'clock, 4 cm from the nipple. The larger mass measures 5 x 6 x 5 mm. The second mass measures 3 x 5 x 5 mm. The masses are 7 mm apart and span a total dimension 1.7 cm. No axillary adenopathy on the left.   10/18/2018 Cancer Staging   Staging form: Breast, AJCC 8th Edition - Clinical stage from 10/18/2018: Stage IA (cT1b, cN0, cM0, G2, ER+, PR+, HER2-) - Signed by [name], MD on 11/07/2018   10/30/2018 Breast MRI   Breast MRI 10/30/18  IMPRESSION: Biopsy-proven malignancy within the left breast 10 o'clock position. No additional suspicious areas of  enhancement identified within either breast.   11/2018 -  Anti-estrogen oral therapy   Anastrozole 6m daily starting 11/2018 (before surgery)   11/12/2018 Genetic Testing   Negative genetic testing on the common hereditary cancer panel.  The Hereditary Gene Panel offered by [facility] includes sequencing and/or deletion duplication testing of the following 48 genes: APC, ATM, AXIN2, BARD1, BMPR1A, BRCA1, BRCA2, BRIP1, CDH1, CDK4, CDKN2A (p14ARF), CDKN2A (p16INK4a), CHEK2, CTNNA1, DICER1, EPCAM (Deletion/duplication testing only), GREM1 (promoter region deletion/duplication testing only), KIT, MEN1, MLH1, MSH2, MSH3, MSH6, MUTYH, NBN, NF1, NHTL1, PALB2, PDGFRA, PMS2, POLD1, POLE, PTEN, RAD50, RAD51C, RAD51D, RNF43, SDHB, SDHC, SDHD, SMAD4, SMARCA4. STK11, TP53, TSC1, TSC2, and VHL.  The following genes were evaluated for sequence changes only: SDHA and HOXB13 c.251G>A variant only. The report date is 11/12/2018.  AXIN2 c.815+fdup ([employer]) VUS identified.  This is still a normal result and will not change medical management.   01/07/2019 Surgery   LEFT BREAST LUMPECTOMY WITH RADIOACTIVE SEED AND LEFT DEEP AXILLARY SENTINEL LYMPH NODE BIOPSY WITH BLUE DYE INJECTION by Dr IDalbert Batman6/2/20    01/07/2019 Pathology Results   Diagnosis 01/07/19 1. Breast, lumpectomy, Left w/seed - INVASIVE LOBULAR CARCINOMA, GRADE 2, 1.5 CM. SEE NOTE - CARCINOMA IS 0.4 CM FROM THE SUPERIOR MARGIN AND 0.6 CM FROM THE INFERIOR MARGIN - NO EVIDENCE OF LYMPHOVASCULAR OR PERINEURAL INVASION - BIOPSY  SITE CHANGES - SEE ONCOLOGY TABLE 2. Lymph node, sentinel, biopsy, Left axillary #1 - LYMPH NODE, NEGATIVE FOR CARCINOMA (0/1). SEE NOTE 3. Lymph node, sentinel, biopsy, Left axillary #2 - LYMPH NODE, NEGATIVE FOR CARCINOMA (0/1). SEE NOTE 4. Lymph node, sentinel, biopsy, Left - LYMPH NODE, NEGATIVE FOR CARCINOMA (0/1). SEE NOTE 5. Lymph node, sentinel, biopsy, Left - LYMPH NODE, NEGATIVE FOR CARCINOMA (0/1). SEE NOTE 6. Lymph node,  sentinel, biopsy, Left axillary #3 - LYMPH NODE, NEGATIVE FOR CARCINOMA (0/1). SEE NOTE   01/07/2019 Cancer Staging   Staging form: Breast, AJCC 8th Edition - Pathologic stage from 01/07/2019: Stage IA (pT1c, pN0, cM0, G2, ER+, PR+, HER2-, Oncotype DX score: 33) - Signed by [name], MD on 01/27/2019   02/13/2019 -  Chemotherapy   The patient had PALONOSETRON HCL INJECTION 0.25 MG/5ML, 0.25 mg, Intravenous,  Once, 0 of 4 cycles pegfilgrastim-cbqv (UDENYCA) injection 6 mg, 6 mg, Subcutaneous, Once, 0 of 4 cycles cyclophosphamide (CYTOXAN) 960 mg in sodium chloride 0.9 % 250 mL chemo infusion, 600 mg/m2, Intravenous,  Once, 0 of 4 cycles DOCEtaxel (TAXOTERE) 120 mg in sodium chloride 0.9 % 250 mL chemo infusion, 75 mg/m2, Intravenous,  Once, 0 of 4 cycles  for chemotherapy treatment.       CURRENT THERAPY:  Anastrozole 60m daily starting 11/2018 (before surgery)  INTERVAL HISTORY:   Jocelyn SALLEYis here for a follow up post surgery.  She underwent left breast lumpectomy and sentinel lymph node biopsy by Dr. IDalbert Batmanon January 07, 2019.  She did very well with surgery, and is recovering well.  Pain is minimal, no significant breast or arm lymphedema so far.  She has recovered well.   She has developed left wrist pain since April, after a few weeks she started anastrozole.  She denies any other joint discomfort.  She otherwise tolerating anastrozole well, no significant hot flash, mood swing, or other side effects.   REVIEW OF SYSTEMS:   Constitutional: Denies fevers, chills or abnormal weight loss Eyes: Denies blurriness of vision Ears, nose, mouth, throat, and face: Denies mucositis or sore throat Respiratory: Denies cough, dyspnea or wheezes Cardiovascular: Denies palpitation, chest discomfort or lower extremity swelling Gastrointestinal:  Denies nausea, heartburn or change in bowel habits Skin: Denies abnormal skin rashes Lymphatics: Denies new lymphadenopathy or easy  bruising Neurological:Denies numbness, tingling or new weaknesses Behavioral/Psych: Mood is stable, no new changes  BREAST: Mild discomfort at his surgical site, no significant pain or edema.  All other systems were reviewed with the patient and are negative.  MEDICAL HISTORY:  Past Medical History:  Diagnosis Date   All[place]    [place]   [place]   [place]   Family history of breast cancer    Family history of colon cancer    Family history of pancreatic cancer    Hot flashes    Hyperlipidemia    Hypothyroidism    Personal history of radiation therapy 2016   Radiation 11/25/14-01/06/15   Right Breast    SURGICAL HISTORY: Past Surgical History:  Procedure Laterality Date   AUGMENTATION MAMMAPLASTY Bilateral 03/10/1997   BREAST BIOPSY Right 09/11/2014   BREAST LUMPECTOMY Right 10/20/2014   BREAST LUMPECTOMY WITH RADIOACTIVE SEED AND SENTINEL LYMPH NODE BIOPSY N/A 01/07/2019   Procedure: LEFT BREAST LUMPECTOMY WITH RADIOACTIVE SEED AND LEFT DEEP AXILLARY SENTINEL LYMPH NODE BIOPSY WITH BLUE DYE INJECTION;  Surgeon: IFanny Skates MD;  Location:  [facility] OR;  Service: General;  Laterality: N/A;   COLONOSCOPY     DILATION AND CURETTAGE OF UTERUS     KNEE ARTHROSCOPY     PLACEMENT OF BREAST IMPLANTS  1995   bilat breast implants   TONSILLECTOMY     TUBAL LIGATION     WISDOM TOOTH EXTRACTION      I have reviewed the social history and family history with the patient and they are unchanged from previous note.  ALLERGIES:  has No Known Allergies.  MEDICATIONS:  Current Outpatient Medications  Medication Sig Dispense Refill   anastrozole (ARIMIDEX) 1 MG tablet Take 1 tablet (1 mg total) by mouth daily. 30 tablet 3   Ascorbic Acid (VITAMIN C) 1000 MG tablet Take 2,000 mg by mouth 3 (three) times a week.     Calcium Carb-Cholecalciferol (CALCIUM 600 + D PO) Take 1 tablet  by mouth 2 (two) times a week.     cholecalciferol (VITAMIN D3) 25 MCG (1000 UT) tablet Take 1,000 Units by mouth 3 (three) times a week.     HYDROcodone-acetaminophen (NORCO) 5-325 MG tablet Take 1-2 tablets by mouth every 6 (six) hours as needed for moderate pain or severe pain. 30 tablet 0   Melatonin 10 MG CAPS Take 10 mg by mouth at bedtime as needed (sleep).     methimazole (TAPAZOLE) 10 MG tablet Take 10 mg by mouth See admin instructions. Take 10 mg all days EXCEPT Sunday     Multiple Vitamin (MULTIVITAMIN WITH MINERALS) TABS tablet Take 1 tablet by mouth 3 (three) times a week.     Naproxen Sod-diphenhydrAMINE (ALEVE PM) 220-25 MG TABS Take 2 tablets by mouth at bedtime as needed (sleep).     naproxen sodium (ALEVE) 220 MG tablet Take 440 mg by mouth 2 (two) times daily as needed (pain).     sertraline (ZOLOFT) 100 MG tablet Take 200 mg by mouth daily.      simvastatin (ZOCOR) 40 MG tablet Take 40 mg by mouth every evening.     No current facility-administered medications for this visit.     PHYSICAL EXAMINATION: ECOG PERFORMANCE STATUS: 0 - Asymptomatic  VITALS AND EXAM NOT TAKEN TODAY   LABORATORY DATA:  I have reviewed the data as listed CBC Latest Ref Rng & Units 12/27/2018 11/07/2018 09/23/2014  WBC 4.0 - 10.5 K/uL 7.1 7.4 7.1  Hemoglobin 12.0 - 15.0 g/dL 14.0 14.0 13.9  Hematocrit 36.0 - 46.0 % 44.5 44.6 43.2  Platelets 150 - 400 K/uL 246 263 270     CMP Latest Ref Rng & Units 12/27/2018 11/07/2018 09/23/2014  Glucose 70 - 99 mg/dL 93 101(H) 95  BUN 8 - 23 mg/dL 16 12 22.2  Creatinine 0.44 - 1.00 mg/dL 0.73 0.76 0.8  Sodium 135 - 145 mmol/L 141 143 145  Potassium 3.5 - 5.1 mmol/L 4.2 4.4 4.5  Chloride 98 - 111 mmol/L 109 107 -  CO2 22 - 32 mmol/L 23 27 27   Calcium 8.9 - 10.3 mg/dL 9.1 9.5 9.5  Total Protein 6.5 - 8.1 g/dL - 7.0 6.6  Total Bilirubin 0.3 - 1.2 mg/dL - 0.4 0.37  Alkaline Phos 38 - 126 U/L - 124 88  AST 15 - 41 U/L - 19 19  ALT 0 - 44 U/L - 24 20       RADIOGRAPHIC STUDIES: I have personally reviewed the radiological images as listed and agreed with the findings in the report. No results found.   ASSESSMENT & PLAN:  [name] is a 68 y.o. female with   1. Malignant neoplasm of upper-inner quadrant of left breast, invasive lobular carcinoma, Stage IA, pT1c,N0,M0, ER+/PR+, HER2-, Grade II -She was diagnosed in 10/2018. She started neoadjuvant anastrozole in 11/2018 due to the delay of her surgery due to [place] pandemic.  -She underwent left breast lumpectomy on 01/07/19. We discussed her pthoalogy report in great detail which shows complete resection, node negative.  -Gust her Oncotype DX test results, which showed recurrence score of 33.  Her predicted distant recurrence with adjuvant antiestrogen therapy is about 21%.  Given her high risk disease, she would benefit from adjuvant chemotherapy.  Given her early stage disease, node-negative, lobular histology, I recommend moderate intensity docetaxel and Cytoxan (TC) every 3 weeks for 4 cycles.  --Chemotherapy consent: Side effects including but does not not limited to, fatigue, nausea, vomiting, diarrhea, hair loss (including small risk of permanent alopecia), neuropathy, fluid retention, renal and kidney dysfunction, neutropenic fever, needed for blood transfusion, bleeding, were discussed with patient in great detail. She agrees to proceed. -the goal of chemo is curative and reduce her risk of recurrence  -She will have adjuvant breast radiation after she completes chemo -Given the strong ER and PR positivity she has started ananstrozole before surgery.  Due to her significant left wrist pain, and upcoming chemotherapy, will hold on anastrozole for now. -plant to start chemo the week of 7/13 -due to her stage IA disease, I do not think she needs staging scan. -pt opted not to have port   2. 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 She is  s/p right lumpectomy and SLNB and adjuvant radiation.  -Given her negative ER/PR status, there is no benefit of antiestrogen therapy. We previously discussed chemoprevention for breast cancer in general, and I do not recommend it given her history of ER/PR negative DCIS  -Continue surveillance. No evidence of recurrence.   3. Genetics -The BRCA plus panel, which includes a BRCA1, BRCA2, CDH1, PALB2, PTEN, TP53 were all negative. -Repeat genetics negative for pathogenetic mutations.   4. Bone health  -She has not had DEXA scan in years  -Will repeat later this year as she will be on anastrozole which can weaken her bone.   PLAN: -discussed ONCOTYPE test result, she has high risk disease -chemo class the week of 7/1 -I called in dexa, zofran, compazine for her, she will take dexa 102m bid the day before chemo  -lab, f/u and first cycle TC the week of 7/13 -stop anastrozole   No problem-specific Assessment & Plan notes found for this encounter.   No orders of the defined types were placed in this encounter.  I discussed the assessment and treatment plan with the patient. The patient was provided an opportunity to ask questions and all were answered. The patient agreed with the plan and demonstrated an understanding of the instructions.  The patient was advised to call back or seek an in-person evaluation if the symptoms worsen or if the condition fails to improve as anticipated.  I provided 25 minutes of non face-to-face telephone visit time during this encounter, and > 50% was spent counseling as documented under my assessment & plan.     YTruitt Merle MD 01/27/2019   I, AJoslyn Devon am acting as scribe for YTruitt Merle MD.   I have reviewed the above documentation for accuracy and completeness, and I agree with the above.

## 2019-01-27 NOTE — Progress Notes (Signed)
START ON PATHWAY REGIMEN - Breast     A cycle is every 21 days:     Docetaxel      Cyclophosphamide   **Always confirm dose/schedule in your pharmacy ordering system**  Patient Characteristics: Postoperative without Neoadjuvant Therapy (Pathologic Staging), Invasive Disease, Adjuvant Therapy, HER2 Negative/Unknown/Equivocal, ER Positive, Node Negative, pT1a-c, pN0/N102m or pT2 or Higher, pN0, Oncotype High Risk (? 26) Therapeutic Status: Postoperative without Neoadjuvant Therapy (Pathologic Staging) AJCC Grade: G2 AJCC N Category: pN0 AJCC M Category: cM0 ER Status: Positive (+) AJCC 8 Stage Grouping: IA HER2 Status: Negative (-) Oncotype Dx Recurrence Score: 33 AJCC T Category: pT1c PR Status: Positive (+) Has this patient completed genomic testing<= Yes - Oncotype DX(R) Intent of Therapy: Curative Intent, Discussed with Patient

## 2019-01-28 ENCOUNTER — Telehealth: Payer: Self-pay | Admitting: Hematology

## 2019-01-28 NOTE — Telephone Encounter (Signed)
Scheduled appt per 6/22 los. Spoke with patient and she is aware of appt date and time.

## 2019-01-29 ENCOUNTER — Telehealth: Payer: Self-pay | Admitting: *Deleted

## 2019-01-29 ENCOUNTER — Encounter: Payer: Self-pay | Admitting: Hematology

## 2019-01-29 NOTE — Telephone Encounter (Signed)
Mailed Onco Type results to pt's home today.

## 2019-01-30 DIAGNOSIS — E0590 Thyrotoxicosis, unspecified without thyrotoxic crisis or storm: Secondary | ICD-10-CM | POA: Diagnosis not present

## 2019-02-03 ENCOUNTER — Telehealth: Payer: Self-pay

## 2019-02-03 NOTE — Telephone Encounter (Signed)
Patient called with questions about her appointment times, verified her appointments, her questions were answered, she verbalized an understanding.

## 2019-02-06 DIAGNOSIS — Z6827 Body mass index (BMI) 27.0-27.9, adult: Secondary | ICD-10-CM | POA: Diagnosis not present

## 2019-02-06 DIAGNOSIS — E041 Nontoxic single thyroid nodule: Secondary | ICD-10-CM | POA: Diagnosis not present

## 2019-02-06 DIAGNOSIS — E0500 Thyrotoxicosis with diffuse goiter without thyrotoxic crisis or storm: Secondary | ICD-10-CM | POA: Diagnosis not present

## 2019-02-06 DIAGNOSIS — C50912 Malignant neoplasm of unspecified site of left female breast: Secondary | ICD-10-CM | POA: Diagnosis not present

## 2019-02-06 DIAGNOSIS — E0590 Thyrotoxicosis, unspecified without thyrotoxic crisis or storm: Secondary | ICD-10-CM | POA: Diagnosis not present

## 2019-02-10 ENCOUNTER — Inpatient Hospital Stay: Payer: PPO | Attending: Hematology

## 2019-02-10 ENCOUNTER — Other Ambulatory Visit: Payer: Self-pay

## 2019-02-10 DIAGNOSIS — Z5111 Encounter for antineoplastic chemotherapy: Secondary | ICD-10-CM | POA: Insufficient documentation

## 2019-02-10 DIAGNOSIS — C50212 Malignant neoplasm of upper-inner quadrant of left female breast: Secondary | ICD-10-CM | POA: Insufficient documentation

## 2019-02-10 DIAGNOSIS — Z5189 Encounter for other specified aftercare: Secondary | ICD-10-CM | POA: Insufficient documentation

## 2019-02-10 DIAGNOSIS — Z170 Estrogen receptor positive status [ER+]: Secondary | ICD-10-CM | POA: Insufficient documentation

## 2019-02-10 DIAGNOSIS — E039 Hypothyroidism, unspecified: Secondary | ICD-10-CM | POA: Insufficient documentation

## 2019-02-17 ENCOUNTER — Telehealth: Payer: Self-pay | Admitting: Hematology

## 2019-02-17 NOTE — Telephone Encounter (Signed)
Added 7/17 injection per 7/13 schedule message. Not able to reach patient or leave message. Patient will be given injection appointment at 7/15 chemo appointment.

## 2019-02-19 ENCOUNTER — Encounter: Payer: Self-pay | Admitting: Nurse Practitioner

## 2019-02-19 ENCOUNTER — Inpatient Hospital Stay: Payer: PPO

## 2019-02-19 ENCOUNTER — Telehealth: Payer: Self-pay | Admitting: Nurse Practitioner

## 2019-02-19 ENCOUNTER — Other Ambulatory Visit: Payer: Self-pay

## 2019-02-19 ENCOUNTER — Inpatient Hospital Stay (HOSPITAL_BASED_OUTPATIENT_CLINIC_OR_DEPARTMENT_OTHER): Payer: PPO | Admitting: Nurse Practitioner

## 2019-02-19 VITALS — BP 158/82 | HR 70 | Temp 96.9°F | Resp 18 | Ht 60.0 in | Wt 148.3 lb

## 2019-02-19 VITALS — BP 147/77 | HR 63 | Resp 18

## 2019-02-19 DIAGNOSIS — Z170 Estrogen receptor positive status [ER+]: Secondary | ICD-10-CM

## 2019-02-19 DIAGNOSIS — C50212 Malignant neoplasm of upper-inner quadrant of left female breast: Secondary | ICD-10-CM

## 2019-02-19 DIAGNOSIS — E039 Hypothyroidism, unspecified: Secondary | ICD-10-CM | POA: Diagnosis not present

## 2019-02-19 DIAGNOSIS — Z5189 Encounter for other specified aftercare: Secondary | ICD-10-CM | POA: Diagnosis not present

## 2019-02-19 DIAGNOSIS — Z5111 Encounter for antineoplastic chemotherapy: Secondary | ICD-10-CM | POA: Diagnosis not present

## 2019-02-19 LAB — CBC WITH DIFFERENTIAL (CANCER CENTER ONLY)
Abs Immature Granulocytes: 0.07 10*3/uL (ref 0.00–0.07)
Basophils Absolute: 0 10*3/uL (ref 0.0–0.1)
Basophils Relative: 0 %
Eosinophils Absolute: 0 10*3/uL (ref 0.0–0.5)
Eosinophils Relative: 0 %
HCT: 42 % (ref 36.0–46.0)
Hemoglobin: 13.8 g/dL (ref 12.0–15.0)
Immature Granulocytes: 1 %
Lymphocytes Relative: 19 %
Lymphs Abs: 2.7 10*3/uL (ref 0.7–4.0)
MCH: 28.7 pg (ref 26.0–34.0)
MCHC: 32.9 g/dL (ref 30.0–36.0)
MCV: 87.3 fL (ref 80.0–100.0)
Monocytes Absolute: 1.2 10*3/uL — ABNORMAL HIGH (ref 0.1–1.0)
Monocytes Relative: 8 %
Neutro Abs: 10.5 10*3/uL — ABNORMAL HIGH (ref 1.7–7.7)
Neutrophils Relative %: 72 %
Platelet Count: 287 10*3/uL (ref 150–400)
RBC: 4.81 MIL/uL (ref 3.87–5.11)
RDW: 12.9 % (ref 11.5–15.5)
WBC Count: 14.5 10*3/uL — ABNORMAL HIGH (ref 4.0–10.5)
nRBC: 0 % (ref 0.0–0.2)

## 2019-02-19 LAB — CMP (CANCER CENTER ONLY)
ALT: 20 U/L (ref 0–44)
AST: 15 U/L (ref 15–41)
Albumin: 4.1 g/dL (ref 3.5–5.0)
Alkaline Phosphatase: 123 U/L (ref 38–126)
Anion gap: 11 (ref 5–15)
BUN: 18 mg/dL (ref 8–23)
CO2: 24 mmol/L (ref 22–32)
Calcium: 9.3 mg/dL (ref 8.9–10.3)
Chloride: 109 mmol/L (ref 98–111)
Creatinine: 0.83 mg/dL (ref 0.44–1.00)
GFR, Est AFR Am: >60 mL/min (ref >60)
GFR, Estimated: >60 mL/min (ref >60)
Glucose, Bld: 111 mg/dL — ABNORMAL HIGH (ref 70–99)
Potassium: 3.9 mmol/L (ref 3.5–5.1)
Sodium: 144 mmol/L (ref 135–145)
Total Bilirubin: 0.3 mg/dL (ref 0.3–1.2)
Total Protein: 7.3 g/dL (ref 6.5–8.1)

## 2019-02-19 MED ORDER — DEXAMETHASONE SODIUM PHOSPHATE 10 MG/ML IJ SOLN
INTRAMUSCULAR | Status: AC
  Filled 2019-02-19: qty 1

## 2019-02-19 MED ORDER — PALONOSETRON HCL INJECTION 0.25 MG/5ML
INTRAVENOUS | Status: AC
  Filled 2019-02-19: qty 5

## 2019-02-19 MED ORDER — DEXAMETHASONE SODIUM PHOSPHATE 10 MG/ML IJ SOLN
10.0000 mg | Freq: Once | INTRAMUSCULAR | Status: AC
  Administered 2019-02-19: 10 mg via INTRAVENOUS

## 2019-02-19 MED ORDER — SODIUM CHLORIDE 0.9 % IV SOLN
600.0000 mg/m2 | Freq: Once | INTRAVENOUS | Status: AC
  Administered 2019-02-19: 960 mg via INTRAVENOUS
  Filled 2019-02-19: qty 48

## 2019-02-19 MED ORDER — PALONOSETRON HCL INJECTION 0.25 MG/5ML
0.2500 mg | Freq: Once | INTRAVENOUS | Status: AC
  Administered 2019-02-19: 0.25 mg via INTRAVENOUS

## 2019-02-19 MED ORDER — SODIUM CHLORIDE 0.9 % IV SOLN
Freq: Once | INTRAVENOUS | Status: AC
  Administered 2019-02-19: 13:00:00 via INTRAVENOUS
  Filled 2019-02-19: qty 250

## 2019-02-19 MED ORDER — SODIUM CHLORIDE 0.9 % IV SOLN
75.0000 mg/m2 | Freq: Once | INTRAVENOUS | Status: AC
  Administered 2019-02-19: 120 mg via INTRAVENOUS
  Filled 2019-02-19: qty 12

## 2019-02-19 NOTE — Progress Notes (Signed)
[name]   [phone] [phone]   Clinic Follow up Note   Patient Care Team: [name], DO as PCP - General (Family Medicine) Erroll Luna, MD as Consulting Physician (General Surgery) [name], MD as Consulting Physician (Hematology) Thea Silversmith, MD as Consulting Physician (Radiation Oncology) Rockwell Germany, RN as Registered Nurse Mauro Kaufmann, RN as Registered Nurse Holley Bouche, NP (Inactive) as Nurse Practitioner (Nurse Practitioner) Sylvan Cheese, NP as Nurse Practitioner (Nurse Practitioner) 02/19/2019  CHIEF COMPLAINT: Follow-up left breast cancer  SUMMARY OF ONCOLOGIC HISTORY: Oncology History Overview Note  Cancer Staging Breast cancer of upper-inner quadrant of right female breast [facility]) Staging form: Breast, AJCC 7th Edition - Clinical stage from 09/23/2014: Stage 0 (Tis (DCIS), N0, M0) - Unsigned - Pathologic stage from 10/22/2014: Stage 0 (Tis (DCIS), N0, cM0) - Signed by [name], MD on 11/03/2014 Staging comments: Staged on final lumpectomy specimen by Dr. [name]  Malignant neoplasm of upper-inner quadrant of left breast in female, estrogen receptor positive ([place]) Staging form: Breast, AJCC 8th Edition - Clinical: cT1b, cN0, cM0, G2, ER+, PR+ - Unsigned    Breast cancer of upper-inner quadrant of right female breast ([place])  09/11/2014 Mammogram   Right breast: area of pleomorphic calcifications and associated architectural distortion, with irregular density at the 1 o'clock position of the right breast. No other suspicious findings in the right breast.    09/11/2014 Breast US   Right breast: area of hypoechogenecity 2 cm x 0.9 cm x 1.6 cm in size, 12 o'clock position, 3 cm the nipple. No discrete suspicious mass.   09/11/2014 Initial Biopsy   Right breast needle core bx (11-12 o'clock, UOQ): DCIS, grade 3, with necrosis and calcifications, ER- (0%), PR- (0%)   09/11/2014 Pathologic Stage   Stage  0: Tis Nx    09/17/2014 Breast MRI   Non mass enhancement in the upper and slight outer right breast measuring approximately 2.6 x 1.7 x 2.2 cm. Biopsy proven fibrocystic changes in the upper outer periareolar right breast manifested as a 9 x 7 mm mass.   09/24/2014 Procedure   Genetic testing: BRCA plus and BreastNext panels ([place]) performed showing no clinically significant variants detected including ATM, BARD1, BRCA1, BRCA2, BRIP1, CDH1, CHEK2, MRE11A, MUTYH, NBN, NF1, PALB2, PTEN, RAD50, RAD51C, RAD51D, and TP53.   10/20/2014 Definitive Surgery   Right lumpectomy with SLNB ([place]): DCIS with necrosis and calcifications, grade 3, 1 mm from nearest margin (posterior), 0/2 LN with evidence of malignancy.   10/20/2014 Clinical Stage   Stage 0: Tis N0   11/25/2014 - 01/06/2015 Radiation Therapy   Adjuvant RT completed [name]): Right breast 50 Gy over 25 fractions; right breast boost 10 Gy over 5 fractions. Total dose received: 60 Gy.    04/13/2015 Survivorship   Survivorship care plan completed and copy mailed to patient as she was unable to be seen in the Survivorship clinic due to patient's schedule.   11/12/2018 Genetic Testing   Negative genetic testing on the common hereditary cancer panel.  The Hereditary Gene Panel offered by [facility] includes sequencing and/or deletion duplication testing of the following 48 genes: APC, ATM, AXIN2, BARD1, BMPR1A, BRCA1, BRCA2, BRIP1, CDH1, CDK4, CDKN2A (p14ARF), CDKN2A (p16INK4a), CHEK2, CTNNA1, DICER1, EPCAM (Deletion/duplication testing only), GREM1 (promoter region deletion/duplication testing only), KIT, MEN1, MLH1, MSH2, MSH3, MSH6, MUTYH, NBN, NF1, NHTL1, PALB2, PDGFRA, PMS2, POLD1, POLE, PTEN, RAD50, RAD51C, RAD51D, RNF43, SDHB, SDHC, SDHD, SMAD4, SMARCA4. STK11, TP53, TSC1, TSC2, and VHL.  The following genes were evaluated for sequence changes only: SDHA and HOXB13 c.251G>A variant only. The report date is 11/12/2018.  AXIN2 c.815+fdup ([employer]) VUS  identified.  This is still a normal result and will not change medical management.   Malignant neoplasm of upper-inner quadrant of left breast in female, estrogen receptor positive ([place])  10/18/2018 Initial Diagnosis   Malignant neoplasm of upper-inner quadrant of left breast in female, estrogen receptor positive ([place])   10/18/2018 Initial Biopsy   Diagnosis Breast, left, needle core biopsy, 10 o'clock, 4 cm fn - INVASIVE MAMMARY CARCINOMA - MAMMARY CARCINOMA IN SITU - SEE COMMENT   10/18/2018 Receptors her2   ER: 100% with strong staining  PR: 80% with strong staining  HER2 Negative    10/18/2018 Imaging   Diagnostic Mammogram 10/18/18  IMPRESSION 2 hypoechoic masses in the left breast at 10 o'clock, 4 cm from the nipple. The larger mass measures 5 x 6 x 5 mm. The second mass measures 3 x 5 x 5 mm. The masses are 7 mm apart and span a total dimension 1.7 cm. No axillary adenopathy on the left.   10/18/2018 Cancer Staging   Staging form: Breast, AJCC 8th Edition - Clinical stage from 10/18/2018: Stage IA (cT1b, cN0, cM0, G2, ER+, PR+, HER2-) - Signed by [name], MD on 11/07/2018   10/30/2018 Breast MRI   Breast MRI 10/30/18  IMPRESSION: Biopsy-proven malignancy within the left breast 10 o'clock position. No additional suspicious areas of enhancement identified within either breast.   11/2018 -  Anti-estrogen oral therapy   Anastrozole 21m daily starting 11/2018 (before surgery)   11/12/2018 Genetic Testing   Negative genetic testing on the common hereditary cancer panel.  The Hereditary Gene Panel offered by [facility] includes sequencing and/or deletion duplication testing of the following 48 genes: APC, ATM, AXIN2, BARD1, BMPR1A, BRCA1, BRCA2, BRIP1, CDH1, CDK4, CDKN2A (p14ARF), CDKN2A (p16INK4a), CHEK2, CTNNA1, DICER1, EPCAM (Deletion/duplication testing only), GREM1 (promoter region deletion/duplication testing only), KIT, MEN1, MLH1, MSH2, MSH3, MSH6, MUTYH, NBN, NF1, NHTL1, PALB2,  PDGFRA, PMS2, POLD1, POLE, PTEN, RAD50, RAD51C, RAD51D, RNF43, SDHB, SDHC, SDHD, SMAD4, SMARCA4. STK11, TP53, TSC1, TSC2, and VHL.  The following genes were evaluated for sequence changes only: SDHA and HOXB13 c.251G>A variant only. The report date is 11/12/2018.  AXIN2 c.815+fdup ([employer]) VUS identified.  This is still a normal result and will not change medical management.   01/07/2019 Surgery   LEFT BREAST LUMPECTOMY WITH RADIOACTIVE SEED AND LEFT DEEP AXILLARY SENTINEL LYMPH NODE BIOPSY WITH BLUE DYE INJECTION by Dr IDalbert Batman6/2/20    01/07/2019 Pathology Results   Diagnosis 01/07/19 1. Breast, lumpectomy, Left w/seed - INVASIVE LOBULAR CARCINOMA, GRADE 2, 1.5 CM. SEE NOTE - CARCINOMA IS 0.4 CM FROM THE SUPERIOR MARGIN AND 0.6 CM FROM THE INFERIOR MARGIN - NO EVIDENCE OF LYMPHOVASCULAR OR PERINEURAL INVASION - BIOPSY SITE CHANGES - SEE ONCOLOGY TABLE 2. Lymph node, sentinel, biopsy, Left axillary #1 - LYMPH NODE, NEGATIVE FOR CARCINOMA (0/1). SEE NOTE 3. Lymph node, sentinel, biopsy, Left axillary #2 - LYMPH NODE, NEGATIVE FOR CARCINOMA (0/1). SEE NOTE 4. Lymph node, sentinel, biopsy, Left - LYMPH NODE, NEGATIVE FOR CARCINOMA (0/1). SEE NOTE 5. Lymph node, sentinel, biopsy, Left - LYMPH NODE, NEGATIVE FOR CARCINOMA (0/1). SEE NOTE 6. Lymph node, sentinel, biopsy, Left axillary #3 - LYMPH NODE, NEGATIVE FOR CARCINOMA (0/1). SEE NOTE   01/07/2019 Cancer Staging   Staging form: Breast, AJCC 8th Edition - Pathologic stage from 01/07/2019: Stage IA (pT1c, pN0, cM0, G2,  ER+, PR+, HER2-, Oncotype DX score: 33) - Signed by [name], MD on 01/27/2019   02/19/2019 -  Chemotherapy   The patient had palonosetron (ALOXI) injection 0.25 mg, 0.25 mg, Intravenous,  Once, 0 of 4 cycles pegfilgrastim-cbqv (UDENYCA) injection 6 mg, 6 mg, Subcutaneous, Once, 0 of 4 cycles cyclophosphamide (CYTOXAN) 960 mg in sodium chloride 0.9 % 250 mL chemo infusion, 600 mg/m2 = 960 mg, Intravenous,  Once, 0 of 4 cycles  DOCEtaxel (TAXOTERE) 120 mg in sodium chloride 0.9 % 250 mL chemo infusion, 75 mg/m2 = 120 mg, Intravenous,  Once, 0 of 4 cycles  for chemotherapy treatment.      PRIOR THERAPY: Anastrozole 1 mg daily, starting 11/2018 before surgery; stopped 01/27/19  CURRENT THERAPY: Adjuvant TC every 3 weeks x4 cycles starting 02/19/2019  INTERVAL HISTORY: Ms. Cheever returns for follow-up and first chemo cycle as scheduled.  She is anxious to begin treatment but feels well in general.  Has a normal appetite and good energy level.  Denies nausea, vomiting, constipation, diarrhea.  No recent fever or chills, denies cough, chest pain, dyspnea.  Denies neuropathy at baseline.  Denies pain.  Breast incisions have healed well.  She attended chemo education class and picked up antiemetics and steroids.  She began Decadron yesterday as prescribed.    MEDICAL HISTORY:  Past Medical History:  Diagnosis Date  . Allergy   . Breast cancer ([place]) 09/11/14   right breast  . Breast cancer of upper-inner quadrant of right female breast ([place]) 09/15/2014  . Family history of breast cancer   . Family history of colon cancer   . Family history of pancreatic cancer   . Hot flashes   . Hyperlipidemia   . Hypothyroidism   . Personal history of radiation therapy 2016  . Radiation 11/25/14-01/06/15   Right Breast    SURGICAL HISTORY: Past Surgical History:  Procedure Laterality Date  . AUGMENTATION MAMMAPLASTY Bilateral 03/10/1997  . BREAST BIOPSY Right 09/11/2014  . BREAST LUMPECTOMY Right 10/20/2014  . BREAST LUMPECTOMY WITH RADIOACTIVE SEED AND SENTINEL LYMPH NODE BIOPSY N/A 01/07/2019   Procedure: LEFT BREAST LUMPECTOMY WITH RADIOACTIVE SEED AND LEFT DEEP AXILLARY SENTINEL LYMPH NODE BIOPSY WITH BLUE DYE INJECTION;  Surgeon: Fanny Skates, MD;  Location: [facility] [facility];  Service: General;  Laterality: N/A;  . COLONOSCOPY    . DILATION AND CURETTAGE OF UTERUS    . KNEE ARTHROSCOPY    . PLACEMENT OF BREAST IMPLANTS  1995   bilat  breast implants  . TONSILLECTOMY    . TUBAL LIGATION    . WISDOM TOOTH EXTRACTION      I have reviewed the social history and family history with the patient and they are unchanged from previous note.  ALLERGIES:  has No Known Allergies.  MEDICATIONS:  Current Outpatient Medications  Medication Sig Dispense Refill  . anastrozole (ARIMIDEX) 1 MG tablet Take 1 tablet (1 mg total) by mouth daily. 30 tablet 3  . Ascorbic Acid (VITAMIN C) 1000 MG tablet Take 2,000 mg by mouth 3 (three) times a week.    . Calcium Carb-Cholecalciferol (CALCIUM 600 + D PO) Take 1 tablet by mouth 2 (two) times a week.    . cholecalciferol (VITAMIN D3) 25 MCG (1000 UT) tablet Take 1,000 Units by mouth 3 (three) times a week.    Marland Kitchen dexamethasone (DECADRON) 4 MG tablet Take 2 tablets (8 mg total) by mouth 2 (two) times daily. Start the day before Taxotere. Then again the day after chemo for  3 days. 30 tablet 1  . HYDROcodone-acetaminophen (NORCO) 5-325 MG tablet Take 1-2 tablets by mouth every 6 (six) hours as needed for moderate pain or severe pain. 30 tablet 0  . Melatonin 10 MG CAPS Take 10 mg by mouth at bedtime as needed (sleep).    . methimazole (TAPAZOLE) 10 MG tablet Take 10 mg by mouth See admin instructions. Take 10 mg all days EXCEPT Sunday    . Multiple Vitamin (MULTIVITAMIN WITH MINERALS) TABS tablet Take 1 tablet by mouth 3 (three) times a week.    . Naproxen Sod-diphenhydrAMINE (ALEVE PM) 220-25 MG TABS Take 2 tablets by mouth at bedtime as needed (sleep).    . naproxen sodium (ALEVE) 220 MG tablet Take 440 mg by mouth 2 (two) times daily as needed (pain).    . ondansetron (ZOFRAN) 8 MG tablet Take 1 tablet (8 mg total) by mouth 2 (two) times daily as needed for refractory nausea / vomiting. Start on day 3 after chemo. 30 tablet 1  . prochlorperazine (COMPAZINE) 10 MG tablet Take 1 tablet (10 mg total) by mouth every 6 (six) hours as needed (Nausea or vomiting). 30 tablet 1  . sertraline (ZOLOFT) 100 MG  tablet Take 200 mg by mouth daily.     . simvastatin (ZOCOR) 40 MG tablet Take 40 mg by mouth every evening.     No current facility-administered medications for this visit.     PHYSICAL EXAMINATION: ECOG PERFORMANCE STATUS: 0 - Asymptomatic  Vitals:   02/19/19 1206  BP: (!) 158/82  Pulse: 70  Resp: 18  Temp: (!) 96.9 F (36.1 C)  SpO2: 99%   Filed Weights   02/19/19 1206  Weight: 148 lb 4.8 oz (67.3 kg)    GENERAL:alert, no distress and comfortable SKIN: No obvious rash EYES: sclera clear  LUNGS: Respirations even and nonlabored HEART: no lower extremity edema ABDOMEN:abdomen flat Musculoskeletal:no cyanosis of digits NEURO: alert & oriented x 3 with fluent speech, normal gait Breast exam: mild generalized erythema to both breasts at baseline. Remote lumpectomy scar noted to right breast. S/p left lumpectomy, incisions healed well. No palpable mass in either breast or axilla that I could appreciate Limited exam for covid19 outbreak   LABORATORY DATA:  I have reviewed the data as listed CBC Latest Ref Rng & Units 02/19/2019 12/27/2018 11/07/2018  WBC 4.0 - 10.5 K/uL 14.5(H) 7.1 7.4  Hemoglobin 12.0 - 15.0 g/dL 13.8 14.0 14.0  Hematocrit 36.0 - 46.0 % 42.0 44.5 44.6  Platelets 150 - 400 K/uL 287 246 263     CMP Latest Ref Rng & Units 02/19/2019 12/27/2018 11/07/2018  Glucose 70 - 99 mg/dL 111(H) 93 101(H)  BUN 8 - 23 mg/dL 18 16 12   Creatinine 0.44 - 1.00 mg/dL 0.83 0.73 0.76  Sodium 135 - 145 mmol/L 144 141 143  Potassium 3.5 - 5.1 mmol/L 3.9 4.2 4.4  Chloride 98 - 111 mmol/L 109 109 107  CO2 22 - 32 mmol/L 24 23 27   Calcium 8.9 - 10.3 mg/dL 9.3 9.1 9.5  Total Protein 6.5 - 8.1 g/dL 7.3 - 7.0  Total Bilirubin 0.3 - 1.2 mg/dL 0.3 - 0.4  Alkaline Phos 38 - 126 U/L 123 - 124  AST 15 - 41 U/L 15 - 19  ALT 0 - 44 U/L 20 - 24      RADIOGRAPHIC STUDIES: I have personally reviewed the radiological images as listed and agreed with the findings in the report. No results  found.   ASSESSMENT &  PLAN: [name] is a 68 y.o. female with   1.Malignant neoplasm of upper-inner quadrant of left breast,invasive lobular carcinoma,StageIA, pT1c,N0,M0, ER+/PR+, HER2-, GradeII -She was diagnosed in 10/2018. She started neoadjuvant anastrozole in 11/2018 due to the delay of her surgery due to [place] pandemic.  -She underwent left breast lumpectomy on 01/07/19. We discussed her pthoalogy report in great detail which shows complete resection, node negative.  -Oncotype DX showed recurrence score of 33.  Dr. Burr Medico previously reviewed her predicted distant recurrence with adjuvant antiestrogen therapy is about 21%.  Given her high risk but early stage disease, node-negative, lobular histology, Dr. Burr Medico recommended moderate intensity docetaxel and Cytoxan (TC) every 3 weeks for 4 cycles. 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 She is s/p rightlumpectomyand SLNB and adjuvant radiation.  -surveillance   3. Genetics -The BRCA plus panel, which includes a BRCA1, BRCA2, CDH1, PALB2, PTEN, TP53 were all negative. -Repeat genetics negative for pathogenetic mutations. This was previously reviewed   4. Bone health  -She has not had DEXA scan in years, will repeat later this year as AI can decrease BMD  DISPO: Ms. Detter appears well. She has recovered well from surgery. She attended chemo education class. She began decadron yesterday as instructed. She does not have PAC, no difficulty with peripheral access at this time. Labs reviewed, WBC slightly elevated possibly from steroids, exam is normal.  Otherwise CBC and CMP adequate for treatment.  We reviewed potential side effects of chemo and udenyca and symptom management.  We reviewed diet and nutrition and the importance of being active. She is ready to begin. She will return for lab and toxicity check next week  with potential IVF. She requested we draw TSH with next labs for her endocrinologist. She will then f/u in 3 weeks with cycle 2.    Orders Placed This Encounter  Procedures  . TSH    Standing Status:   Future    Standing Expiration Date:   02/19/2020   All questions were answered. The patient knows to call the clinic with any problems, questions or concerns. No barriers to learning was detected.     Alla Feeling, NP 02/19/19

## 2019-02-19 NOTE — Telephone Encounter (Signed)
Scheduled appt per 7/15 los.  Called the treatment area and they are going to print out the patient appt calendar.

## 2019-02-19 NOTE — Progress Notes (Signed)
Met with patient at registration to introduce myself as Financial Resource Specialist and to offer available resources. ° °Discussed one-time $1000 [employer] grant and qualifications to assist with personal expenses while going through treatment. ° °Gave her my card if interested in applying and for any additional financial questions or concerns. She verbalized understanding. °

## 2019-02-19 NOTE — Patient Instructions (Signed)
[facility] Discharge Instructions for Patients Receiving Chemotherapy  Today you received the following chemotherapy agents Taxotere and Cytoxan  To help prevent nausea and vomiting after your treatment, we encourage you to take your nausea medication as directed   If you develop nausea and vomiting that is not controlled by your nausea medication, call the clinic.   BELOW ARE SYMPTOMS THAT SHOULD BE REPORTED IMMEDIATELY:  *FEVER GREATER THAN 100.5 F  *CHILLS WITH OR WITHOUT FEVER  NAUSEA AND VOMITING THAT IS NOT CONTROLLED WITH YOUR NAUSEA MEDICATION  *UNUSUAL SHORTNESS OF BREATH  *UNUSUAL BRUISING OR BLEEDING  TENDERNESS IN MOUTH AND THROAT WITH OR WITHOUT PRESENCE OF ULCERS  *URINARY PROBLEMS  *BOWEL PROBLEMS  UNUSUAL RASH Items with * indicate a potential emergency and should be followed up as soon as possible.  Feel free to call the clinic should you have any questions or concerns. The clinic phone number is [phone].  Please show the [facility] at check-in to the Emergency Department and triage nurse.   Docetaxel injection(Taxotere) What is this medicine? DOCETAXEL (doe se TAX el) is a chemotherapy drug. It targets fast dividing cells, like cancer cells, and causes these cells to die. This medicine is used to treat many types of cancers like breast cancer, certain stomach cancers, head and neck cancer, lung cancer, and prostate cancer. This medicine may be used for other purposes; ask your health care provider or pharmacist if you have questions. COMMON BRAND NAME(S): Docefrez, Taxotere What should I tell my health care provider before I take this medicine? They need to know if you have any of these conditions:  infection (especially a virus infection such as chickenpox, cold sores, or herpes)  liver disease  low blood counts, like low white cell, platelet, or red cell counts  an unusual or allergic reaction to docetaxel, polysorbate  80, other chemotherapy agents, other medicines, foods, dyes, or preservatives  pregnant or trying to get pregnant  breast-feeding How should I use this medicine? This drug is given as an infusion into a vein. It is administered in a hospital or clinic by a specially trained health care professional. Talk to your pediatrician regarding the use of this medicine in children. Special care may be needed. Overdosage: If you think you have taken too much of this medicine contact a poison control center or emergency room at once. NOTE: This medicine is only for you. Do not share this medicine with others. What if I miss a dose? It is important not to miss your dose. Call your doctor or health care professional if you are unable to keep an appointment. What may interact with this medicine?  aprepitant  certain antibiotics like erythromycin or clarithromycin  certain antivirals for HIV or hepatitis  certain medicines for fungal infections like fluconazole, itraconazole, ketoconazole, posaconazole, or voriconazole  cimetidine  ciprofloxacin  conivaptan  cyclosporine  dronedarone  fluvoxamine  grapefruit juice  imatinib  verapamil This list may not describe all possible interactions. Give your health care provider a list of all the medicines, herbs, non-prescription drugs, or dietary supplements you use. Also tell them if you smoke, drink alcohol, or use illegal drugs. Some items may interact with your medicine. What should I watch for while using this medicine? Your condition will be monitored carefully while you are receiving this medicine. You will need important blood work done while you are taking this medicine. Call your doctor or health care professional for advice if you get a  fever, chills or sore throat, or other symptoms of a cold or flu. Do not treat yourself. This drug decreases your body's ability to fight infections. Try to avoid being around people who are sick. Some  products may contain alcohol. Ask your health care professional if this medicine contains alcohol. Be sure to tell all health care professionals you are taking this medicine. Certain medicines, like metronidazole and disulfiram, can cause an unpleasant reaction when taken with alcohol. The reaction includes flushing, headache, nausea, vomiting, sweating, and increased thirst. The reaction can last from 30 minutes to several hours. You may get drowsy or dizzy. Do not drive, use machinery, or do anything that needs mental alertness until you know how this medicine affects you. Do not stand or sit up quickly, especially if you are an older patient. This reduces the risk of dizzy or fainting spells. Alcohol may interfere with the effect of this medicine. Talk to your health care professional about your risk of cancer. You may be more at risk for certain types of cancer if you take this medicine. Do not become pregnant while taking this medicine or for 6 months after stopping it. Women should inform their doctor if they wish to become pregnant or think they might be pregnant. There is a potential for serious side effects to an unborn child. Talk to your health care professional or pharmacist for more information. Do not breast-feed an infant while taking this medicine or for 1 to 2 weeks after stopping it. Males who get this medicine must use a condom during sex with females who can get pregnant. If you get a woman pregnant, the baby could have birth defects. The baby could die before they are born. You will need to continue wearing a condom for 3 months after stopping the medicine. Tell your health care provider right away if your partner becomes pregnant while you are taking this medicine. This may interfere with the ability to father a child. You should talk to your doctor or health care professional if you are concerned about your fertility. What side effects may I notice from receiving this medicine? Side  effects that you should report to your doctor or health care professional as soon as possible:  allergic reactions like skin rash, itching or hives, swelling of the face, lips, or tongue  blurred vision  breathing problems  changes in vision  low blood counts - This drug may decrease the number of white blood cells, red blood cells and platelets. You may be at increased risk for infections and bleeding.  nausea and vomiting  pain, redness or irritation at site where injected  pain, tingling, numbness in the hands or feet  redness, blistering, peeling, or loosening of the skin, including inside the mouth  signs of decreased platelets or bleeding - bruising, pinpoint red spots on the skin, black, tarry stools, nosebleeds  signs of decreased red blood cells - unusually weak or tired, fainting spells, lightheadedness  signs of infection - fever or chills, cough, sore throat, pain or difficulty passing urine  swelling of the ankle, feet, hands Side effects that usually do not require medical attention (report to your doctor or health care professional if they continue or are bothersome):  constipation  diarrhea  fingernail or toenail changes  hair loss  loss of appetite  mouth sores  muscle pain This list may not describe all possible side effects. Call your doctor for medical advice about side effects. You may report side effects to  FDA at 1-800-FDA-1088. Where should I keep my medicine? This drug is given in a hospital or clinic and will not be stored at home. NOTE: This sheet is a summary. It may not cover all possible information. If you have questions about this medicine, talk to your doctor, pharmacist, or health care provider.  2020 [employer]/Gold Standard (2018-09-27 12:23:11)   Cyclophosphamide injection(Cytoxan) What is this medicine? CYCLOPHOSPHAMIDE (sye kloe FOSS fa mide) is a chemotherapy drug. It slows the growth of cancer cells. This medicine is used to  treat many types of cancer like lymphoma, myeloma, leukemia, breast cancer, and ovarian cancer, to name a few. This medicine may be used for other purposes; ask your health care provider or pharmacist if you have questions. COMMON BRAND NAME(S): Cytoxan, Neosar What should I tell my health care provider before I take this medicine? They need to know if you have any of these conditions:  blood disorders  history of other chemotherapy  infection  kidney disease  liver disease  recent or ongoing radiation therapy  tumors in the bone marrow  an unusual or allergic reaction to cyclophosphamide, other chemotherapy, other medicines, foods, dyes, or preservatives  pregnant or trying to get pregnant  breast-feeding How should I use this medicine? This drug is usually given as an injection into a vein or muscle or by infusion into a vein. It is administered in a hospital or clinic by a specially trained health care professional. Talk to your pediatrician regarding the use of this medicine in children. Special care may be needed. Overdosage: If you think you have taken too much of this medicine contact a poison control center or emergency room at once. NOTE: This medicine is only for you. Do not share this medicine with others. What if I miss a dose? It is important not to miss your dose. Call your doctor or health care professional if you are unable to keep an appointment. What may interact with this medicine? This medicine may interact with the following medications:  amiodarone  amphotericin B  azathioprine  certain antiviral medicines for HIV or AIDS such as protease inhibitors (e.g., indinavir, ritonavir) and zidovudine  certain blood pressure medications such as benazepril, captopril, enalapril, fosinopril, lisinopril, moexipril, monopril, perindopril, quinapril, ramipril, trandolapril  certain cancer medications such as anthracyclines (e.g., daunorubicin, doxorubicin),  busulfan, cytarabine, paclitaxel, pentostatin, tamoxifen, trastuzumab  certain diuretics such as chlorothiazide, chlorthalidone, hydrochlorothiazide, indapamide, metolazone  certain medicines that treat or prevent blood clots like warfarin  certain muscle relaxants such as succinylcholine  cyclosporine  etanercept  indomethacin  medicines to increase blood counts like filgrastim, pegfilgrastim, sargramostim  medicines used as general anesthesia  metronidazole  natalizumab This list may not describe all possible interactions. Give your health care provider a list of all the medicines, herbs, non-prescription drugs, or dietary supplements you use. Also tell them if you smoke, drink alcohol, or use illegal drugs. Some items may interact with your medicine. What should I watch for while using this medicine? Visit your doctor for checks on your progress. This drug may make you feel generally unwell. This is not uncommon, as chemotherapy can affect healthy cells as well as cancer cells. Report any side effects. Continue your course of treatment even though you feel ill unless your doctor tells you to stop. Drink water or other fluids as directed. Urinate often, even at night. In some cases, you may be given additional medicines to help with side effects. Follow all directions for their use. Call your  doctor or health care professional for advice if you get a fever, chills or sore throat, or other symptoms of a cold or flu. Do not treat yourself. This drug decreases your body's ability to fight infections. Try to avoid being around people who are sick. This medicine may increase your risk to bruise or bleed. Call your doctor or health care professional if you notice any unusual bleeding. Be careful brushing and flossing your teeth or using a toothpick because you may get an infection or bleed more easily. If you have any dental work done, tell your dentist you are receiving this medicine. You  may get drowsy or dizzy. Do not drive, use machinery, or do anything that needs mental alertness until you know how this medicine affects you. Do not become pregnant while taking this medicine or for 1 year after stopping it. Women should inform their doctor if they wish to become pregnant or think they might be pregnant. Men should not father a child while taking this medicine and for 4 months after stopping it. There is a potential for serious side effects to an unborn child. Talk to your health care professional or pharmacist for more information. Do not breast-feed an infant while taking this medicine. This medicine may interfere with the ability to have a child. This medicine has caused ovarian failure in some women. This medicine has caused reduced sperm counts in some men. You should talk with your doctor or health care professional if you are concerned about your fertility. If you are going to have surgery, tell your doctor or health care professional that you have taken this medicine. What side effects may I notice from receiving this medicine? Side effects that you should report to your doctor or health care professional as soon as possible:  allergic reactions like skin rash, itching or hives, swelling of the face, lips, or tongue  low blood counts - this medicine may decrease the number of white blood cells, red blood cells and platelets. You may be at increased risk for infections and bleeding.  signs of infection - fever or chills, cough, sore throat, pain or difficulty passing urine  signs of decreased platelets or bleeding - bruising, pinpoint red spots on the skin, black, tarry stools, blood in the urine  signs of decreased red blood cells - unusually weak or tired, fainting spells, lightheadedness  breathing problems  dark urine  dizziness  palpitations  swelling of the ankles, feet, hands  trouble passing urine or change in the amount of urine  weight gain  yellowing  of the eyes or skin Side effects that usually do not require medical attention (report to your doctor or health care professional if they continue or are bothersome):  changes in nail or skin color  hair loss  missed menstrual periods  mouth sores  nausea, vomiting This list may not describe all possible side effects. Call your doctor for medical advice about side effects. You may report side effects to FDA at 1-800-FDA-1088. Where should I keep my medicine? This drug is given in a hospital or clinic and will not be stored at home. NOTE: This sheet is a summary. It may not cover all possible information. If you have questions about this medicine, talk to your doctor, pharmacist, or health care provider.  2020 [employer]/Gold Standard (2012-06-07 16:22:58)

## 2019-02-21 ENCOUNTER — Other Ambulatory Visit: Payer: Self-pay

## 2019-02-21 ENCOUNTER — Inpatient Hospital Stay: Payer: PPO

## 2019-02-21 VITALS — BP 149/62 | HR 62 | Temp 99.1°F | Resp 18

## 2019-02-21 DIAGNOSIS — Z5111 Encounter for antineoplastic chemotherapy: Secondary | ICD-10-CM | POA: Diagnosis not present

## 2019-02-21 DIAGNOSIS — C50212 Malignant neoplasm of upper-inner quadrant of left female breast: Secondary | ICD-10-CM

## 2019-02-21 DIAGNOSIS — Z170 Estrogen receptor positive status [ER+]: Secondary | ICD-10-CM

## 2019-02-21 MED ORDER — PEGFILGRASTIM-CBQV 6 MG/0.6ML ~~LOC~~ SOSY
6.0000 mg | PREFILLED_SYRINGE | Freq: Once | SUBCUTANEOUS | Status: AC
  Administered 2019-02-21: 6 mg via SUBCUTANEOUS

## 2019-02-21 MED ORDER — PEGFILGRASTIM-CBQV 6 MG/0.6ML ~~LOC~~ SOSY
PREFILLED_SYRINGE | SUBCUTANEOUS | Status: AC
  Filled 2019-02-21: qty 0.6

## 2019-02-21 NOTE — Patient Instructions (Signed)

## 2019-02-25 NOTE — Progress Notes (Addendum)
[place]   [phone] [phone]   Clinic Follow up Note   Patient Care Team: [name], DO as PCP - General (Family Medicine) Erroll Luna, MD as Consulting Physician (General Surgery) [name], MD as Consulting Physician (Hematology) Thea Silversmith, MD as Consulting Physician (Radiation Oncology) Rockwell Germany, RN as Registered Nurse Mauro Kaufmann, RN as Registered Nurse Holley Bouche, NP (Inactive) as Nurse Practitioner (Nurse Practitioner) Sylvan Cheese, NP as Nurse Practitioner (Nurse Practitioner) 02/26/2019  CHIEF COMPLAINT: f/u breast cancer, toxicity check   SUMMARY OF ONCOLOGIC HISTORY: Oncology History Overview Note  Cancer Staging Breast cancer of upper-inner quadrant of right female breast [facility]) Staging form: Breast, AJCC 7th Edition - Clinical stage from 09/23/2014: Stage 0 (Tis (DCIS), N0, M0) - Unsigned - Pathologic stage from 10/22/2014: Stage 0 (Tis (DCIS), N0, cM0) - Signed by [name], MD on 11/03/2014 Staging comments: Staged on final lumpectomy specimen by Dr. [name]  Malignant neoplasm of upper-inner quadrant of left breast in female, estrogen receptor positive ([place]) Staging form: Breast, AJCC 8th Edition - Clinical: cT1b, cN0, cM0, G2, ER+, PR+ - Unsigned    Breast cancer of upper-inner quadrant of right female breast ([place])  09/11/2014 Mammogram   Right breast: area of pleomorphic calcifications and associated architectural distortion, with irregular density at the 1 o'clock position of the right breast. No other suspicious findings in the right breast.    09/11/2014 Breast US   Right breast: area of hypoechogenecity 2 cm x 0.9 cm x 1.6 cm in size, 12 o'clock position, 3 cm the nipple. No discrete suspicious mass.   09/11/2014 Initial Biopsy   Right breast needle core bx (11-12 o'clock, UOQ): DCIS, grade 3, with necrosis and calcifications, ER- (0%), PR- (0%)   09/11/2014 Pathologic Stage   Stage 0: Tis Nx    09/17/2014 Breast MRI   Non mass enhancement in the upper and slight outer right breast measuring approximately 2.6 x 1.7 x 2.2 cm. Biopsy proven fibrocystic changes in the upper outer periareolar right breast manifested as a 9 x 7 mm mass.   09/24/2014 Procedure   Genetic testing: BRCA plus and BreastNext panels ([place]) performed showing no clinically significant variants detected including ATM, BARD1, BRCA1, BRCA2, BRIP1, CDH1, CHEK2, MRE11A, MUTYH, NBN, NF1, PALB2, PTEN, RAD50, RAD51C, RAD51D, and TP53.   10/20/2014 Definitive Surgery   Right lumpectomy with SLNB ([place]): DCIS with necrosis and calcifications, grade 3, 1 mm from nearest margin (posterior), 0/2 LN with evidence of malignancy.   10/20/2014 Clinical Stage   Stage 0: Tis N0   11/25/2014 - 01/06/2015 Radiation Therapy   Adjuvant RT completed [name]): Right breast 50 Gy over 25 fractions; right breast boost 10 Gy over 5 fractions. Total dose received: 60 Gy.    04/13/2015 Survivorship   Survivorship care plan completed and copy mailed to patient as she was unable to be seen in the Survivorship clinic due to patient's schedule.   11/12/2018 Genetic Testing   Negative genetic testing on the common hereditary cancer panel.  The Hereditary Gene Panel offered by [facility] includes sequencing and/or deletion duplication testing of the following 48 genes: APC, ATM, AXIN2, BARD1, BMPR1A, BRCA1, BRCA2, BRIP1, CDH1, CDK4, CDKN2A (p14ARF), CDKN2A (p16INK4a), CHEK2, CTNNA1, DICER1, EPCAM (Deletion/duplication testing only), GREM1 (promoter region deletion/duplication testing only), KIT, MEN1, MLH1, MSH2, MSH3, MSH6, MUTYH, NBN, NF1, NHTL1, PALB2, PDGFRA, PMS2, POLD1, POLE, PTEN, RAD50, RAD51C, RAD51D, RNF43, SDHB, SDHC, SDHD, SMAD4, SMARCA4. STK11, TP53, TSC1, TSC2, and  VHL.  The following genes were evaluated for sequence changes only: SDHA and HOXB13 c.251G>A variant only. The report date is 11/12/2018.  AXIN2 c.815+fdup  (Intronic) VUS identified.  This is still a normal result and will not change medical management.   Malignant neoplasm of upper-inner quadrant of left breast in female, estrogen receptor positive ([place])  10/18/2018 Initial Diagnosis   Malignant neoplasm of upper-inner quadrant of left breast in female, estrogen receptor positive ([place])   10/18/2018 Initial Biopsy   Diagnosis Breast, left, needle core biopsy, 10 o'clock, 4 cm fn - INVASIVE MAMMARY CARCINOMA - MAMMARY CARCINOMA IN SITU - SEE COMMENT   10/18/2018 Receptors her2   ER: 100% with strong staining  PR: 80% with strong staining  HER2 Negative    10/18/2018 Imaging   Diagnostic Mammogram 10/18/18  IMPRESSION 2 hypoechoic masses in the left breast at 10 o'clock, 4 cm from the nipple. The larger mass measures 5 x 6 x 5 mm. The second mass measures 3 x 5 x 5 mm. The masses are 7 mm apart and span a total dimension 1.7 cm. No axillary adenopathy on the left.   10/18/2018 Cancer Staging   Staging form: Breast, AJCC 8th Edition - Clinical stage from 10/18/2018: Stage IA (cT1b, cN0, cM0, G2, ER+, PR+, HER2-) - Signed by [name], MD on 11/07/2018   10/30/2018 Breast MRI   Breast MRI 10/30/18  IMPRESSION: Biopsy-proven malignancy within the left breast 10 o'clock position. No additional suspicious areas of enhancement identified within either breast.   11/2018 -  Anti-estrogen oral therapy   Anastrozole '1mg'$  daily starting 11/2018 (before surgery)   11/12/2018 Genetic Testing   Negative genetic testing on the common hereditary cancer panel.  The Hereditary Gene Panel offered by [facility] includes sequencing and/or deletion duplication testing of the following 48 genes: APC, ATM, AXIN2, BARD1, BMPR1A, BRCA1, BRCA2, BRIP1, CDH1, CDK4, CDKN2A (p14ARF), CDKN2A (p16INK4a), CHEK2, CTNNA1, DICER1, EPCAM (Deletion/duplication testing only), GREM1 (promoter region deletion/duplication testing only), KIT, MEN1, MLH1, MSH2, MSH3, MSH6, MUTYH, NBN, NF1,  NHTL1, PALB2, PDGFRA, PMS2, POLD1, POLE, PTEN, RAD50, RAD51C, RAD51D, RNF43, SDHB, SDHC, SDHD, SMAD4, SMARCA4. STK11, TP53, TSC1, TSC2, and VHL.  The following genes were evaluated for sequence changes only: SDHA and HOXB13 c.251G>A variant only. The report date is 11/12/2018.  AXIN2 c.815+fdup (Intronic) VUS identified.  This is still a normal result and will not change medical management.   01/07/2019 Surgery   LEFT BREAST LUMPECTOMY WITH RADIOACTIVE SEED AND LEFT DEEP AXILLARY SENTINEL LYMPH NODE BIOPSY WITH BLUE DYE INJECTION by Dr Dalbert Batman 01/07/19    01/07/2019 Pathology Results   Diagnosis 01/07/19 1. Breast, lumpectomy, Left w/seed - INVASIVE LOBULAR CARCINOMA, GRADE 2, 1.5 CM. SEE NOTE - CARCINOMA IS 0.4 CM FROM THE SUPERIOR MARGIN AND 0.6 CM FROM THE INFERIOR MARGIN - NO EVIDENCE OF LYMPHOVASCULAR OR PERINEURAL INVASION - BIOPSY SITE CHANGES - SEE ONCOLOGY TABLE 2. Lymph node, sentinel, biopsy, Left axillary #1 - LYMPH NODE, NEGATIVE FOR CARCINOMA (0/1). SEE NOTE 3. Lymph node, sentinel, biopsy, Left axillary #2 - LYMPH NODE, NEGATIVE FOR CARCINOMA (0/1). SEE NOTE 4. Lymph node, sentinel, biopsy, Left - LYMPH NODE, NEGATIVE FOR CARCINOMA (0/1). SEE NOTE 5. Lymph node, sentinel, biopsy, Left - LYMPH NODE, NEGATIVE FOR CARCINOMA (0/1). SEE NOTE 6. Lymph node, sentinel, biopsy, Left axillary #3 - LYMPH NODE, NEGATIVE FOR CARCINOMA (0/1). SEE NOTE   01/07/2019 Cancer Staging   Staging form: Breast, AJCC 8th Edition - Pathologic stage from 01/07/2019: Stage IA (pT1c, pN0,  cM0, G2, ER+, PR+, HER2-, Oncotype DX score: 33) - Signed by [name], MD on 01/27/2019   02/19/2019 -  Chemotherapy   The patient had palonosetron (ALOXI) injection 0.25 mg, 0.25 mg, Intravenous,  Once, 1 of 4 cycles Administration: 0.25 mg (02/19/2019) pegfilgrastim-cbqv (UDENYCA) injection 6 mg, 6 mg, Subcutaneous, Once, 1 of 4 cycles Administration: 6 mg (02/21/2019) cyclophosphamide (CYTOXAN) 960 mg in sodium chloride 0.9  % 250 mL chemo infusion, 600 mg/m2 = 960 mg, Intravenous,  Once, 1 of 4 cycles Administration: 960 mg (02/19/2019) DOCEtaxel (TAXOTERE) 120 mg in sodium chloride 0.9 % 250 mL chemo infusion, 75 mg/m2 = 120 mg, Intravenous,  Once, 1 of 4 cycles Administration: 120 mg (02/19/2019)  for chemotherapy treatment.     PRIOR THERAPY: Anastrozole 1 mg daily, starting 11/2018 before surgery; stopped 01/27/19  CURRENT THERAPY: Adjuvant TC every 3 weeks x4 cycles starting 02/19/2019  INTERVAL HISTORY: Ms. Escutia returns for toxicity f/u as scheduled. She completed first cycle adjuvant TC with udenyca on 02/19/19. Today is the first day since treatment she has felt well. She had one week of moderate fatigue and bone pain after injection. She felt achy all over, aleve helped some. She remained able to get out of bed and function. Taste is decreased, but she continues to eat and drink. Denies mucositis. Denies lacrimation. She was constipated, took dulcolax, now with loose stool. She took zofran once which caused headache so she stopped it, no other n/v. She has night sweats which are worse on chemo, now occurring nightly as opposed to every couple of weeks before chemo. Denies fever, chills. Denies cough, chest pain, dyspnea, leg edema. Denies changes in her breasts or incision.     MEDICAL HISTORY:  Past Medical History:  Diagnosis Date  . Allergy   . Breast cancer ([place]) 09/11/14   right breast  . Breast cancer of upper-inner quadrant of right female breast ([place]) 09/15/2014  . Family history of breast cancer   . Family history of colon cancer   . Family history of pancreatic cancer   . Hot flashes   . Hyperlipidemia   . Hypothyroidism   . Personal history of radiation therapy 2016  . Radiation 11/25/14-01/06/15   Right Breast    SURGICAL HISTORY: Past Surgical History:  Procedure Laterality Date  . AUGMENTATION MAMMAPLASTY Bilateral 03/10/1997  . BREAST BIOPSY Right 09/11/2014  . BREAST LUMPECTOMY Right  10/20/2014  . BREAST LUMPECTOMY WITH RADIOACTIVE SEED AND SENTINEL LYMPH NODE BIOPSY N/A 01/07/2019   Procedure: LEFT BREAST LUMPECTOMY WITH RADIOACTIVE SEED AND LEFT DEEP AXILLARY SENTINEL LYMPH NODE BIOPSY WITH BLUE DYE INJECTION;  Surgeon: Fanny Skates, MD;  Location: [place];  Service: General;  Laterality: N/A;  . COLONOSCOPY    . DILATION AND CURETTAGE OF UTERUS    . KNEE ARTHROSCOPY    . PLACEMENT OF BREAST IMPLANTS  1995   bilat breast implants  . TONSILLECTOMY    . TUBAL LIGATION    . WISDOM TOOTH EXTRACTION      I have reviewed the social history and family history with the patient and they are unchanged from previous note.  ALLERGIES:  has No Known Allergies.  MEDICATIONS:  Current Outpatient Medications  Medication Sig Dispense Refill  . anastrozole (ARIMIDEX) 1 MG tablet Take 1 tablet (1 mg total) by mouth daily. 30 tablet 3  . Ascorbic Acid (VITAMIN C) 1000 MG tablet Take 2,000 mg by mouth 3 (three) times a week.    . Calcium Carb-Cholecalciferol (  CALCIUM 600 + D PO) Take 1 tablet by mouth 2 (two) times a week.    . cholecalciferol (VITAMIN D3) 25 MCG (1000 UT) tablet Take 1,000 Units by mouth 3 (three) times a week.    Marland Kitchen dexamethasone (DECADRON) 4 MG tablet Take 2 tablets (8 mg total) by mouth 2 (two) times daily. Start the day before Taxotere. Then again the day after chemo for 3 days. 30 tablet 1  . Melatonin 10 MG CAPS Take 10 mg by mouth at bedtime as needed (sleep).    . methimazole (TAPAZOLE) 10 MG tablet Take 10 mg by mouth See admin instructions. Take 10 mg all days EXCEPT Sunday    . Multiple Vitamin (MULTIVITAMIN WITH MINERALS) TABS tablet Take 1 tablet by mouth 3 (three) times a week.    . Naproxen Sod-diphenhydrAMINE (ALEVE PM) 220-25 MG TABS Take 2 tablets by mouth at bedtime as needed (sleep).    . naproxen sodium (ALEVE) 220 MG tablet Take 440 mg by mouth 2 (two) times daily as needed (pain).    . ondansetron (ZOFRAN) 8 MG tablet Take 1 tablet (8 mg total)  by mouth 2 (two) times daily as needed for refractory nausea / vomiting. Start on day 3 after chemo. 30 tablet 1  . prochlorperazine (COMPAZINE) 10 MG tablet Take 1 tablet (10 mg total) by mouth every 6 (six) hours as needed (Nausea or vomiting). 30 tablet 1  . sertraline (ZOLOFT) 100 MG tablet Take 200 mg by mouth daily.     . simvastatin (ZOCOR) 40 MG tablet Take 40 mg by mouth every evening.    Marland Kitchen HYDROcodone-acetaminophen (NORCO) 5-325 MG tablet Take 1-2 tablets by mouth every 6 (six) hours as needed for moderate pain or severe pain. 30 tablet 0   No current facility-administered medications for this visit.     PHYSICAL EXAMINATION: ECOG PERFORMANCE STATUS: 1 - Symptomatic but completely ambulatory  Vitals:   02/26/19 1155  BP: 138/74  Pulse: 91  Resp: 18  Temp: 97.6 F (36.4 C)  SpO2: 94%    Repeat pulse ox 98% on RA  Filed Weights   02/26/19 1155  Weight: 147 lb 12.8 oz (67 kg)    GENERAL:alert, no distress and comfortable SKIN: no obvious rash  EYES: sclera clear OROPHARYNX: no thrush or ulcers  LUNGS: clear to auscultation with normal breathing effort HEART: regular rate & rhythm, no lower extremity edema Musculoskeletal:no cyanosis of digits  NEURO: alert & oriented x 3 with fluent speech, normal gait  LABORATORY DATA:  I have reviewed the data as listed CBC Latest Ref Rng & Units 02/26/2019 02/19/2019 12/27/2018  WBC 4.0 - 10.5 K/uL 15.6(H) 14.5(H) 7.1  Hemoglobin 12.0 - 15.0 g/dL 13.4 13.8 14.0  Hematocrit 36.0 - 46.0 % 41.3 42.0 44.5  Platelets 150 - 400 K/uL 128(L) 287 246     CMP Latest Ref Rng & Units 02/26/2019 02/19/2019 12/27/2018  Glucose 70 - 99 mg/dL 103(H) 111(H) 93  BUN 8 - 23 mg/dL _0 Creatinine 0.44 - 1.00 mg/dL 0.73 0.83 0.73  Sodium 135 - 145 mmol/L 142 144 141  Potassium 3.5 - 5.1 mmol/L 4.2 3.9 4.2  Chloride 98 - 111 mmol/L 109 109 109  CO2 22 - 32 mmol/L _1 Calcium 8.9 - 10.3 mg/dL 8.6(L) 9.3 9.1  Total Protein 6.5 - 8.1  g/dL 6.4(L) 7.3 -  Total Bilirubin 0.3 - 1.2 mg/dL 0.4 0.3 -  Alkaline Phos 38 - 126 U/L 136(H)  123 -  AST 15 - 41 U/L 25 15 -  ALT 0 - 44 U/L 14 20 -      RADIOGRAPHIC STUDIES: I have personally reviewed the radiological images as listed and agreed with the findings in the report. No results found.   ASSESSMENT & PLAN: [name] a 68 y.o.femalewith   1.Malignant neoplasm of upper-inner quadrant of left breast,invasive lobular carcinoma,StageIA,pT1c,N0,M0, ER+/PR+, HER2-, GradeII -She was diagnosed in 10/2018. She started neoadjuvant anastrozole in 4/2020due to the delay of her surgery due to [place] pandemic.  -She underwent left breast lumpectomy on 01/07/19. Pathoalogy report shows complete resection, node negative. -Oncotype DX showed recurrence score of 33.  Dr. Burr Medico previously reviewedher predicted distant recurrence with adjuvant antiestrogen therapy is about 21%.Given her high risk but early stage disease, node-negative, lobular histology, Dr. Burr Medico recommended moderate intensity docetaxel and Cytoxan (TC) every3 weeks for 4 cycles. 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 She is s/p rightlumpectomyand SLNB and adjuvant radiation.  -surveillance   3. Genetics -The BRCA plus panel, which includes a BRCA1, BRCA2, CDH1, PALB2, PTEN, TP53 were all negative. -Repeat genetics negative for pathogenetic mutations. This was previously reviewed   4. Bone health  -She has not had DEXA scan in years, will repeat later this year as AI can decrease BMD  DISPO: Ms. Bulger appears well. She completed cycle 1 adjuvant TC with Udenyca. She tolerated chemo well with moderate fatigue. She experienced significant generalized bone pain from [place]. She remained able to complete daily function. She is recovering well.  She took claritin and aleve for 1 week. I encouraged her to alternate with tylenol. She can take 1/2 tab norco if pain is severe. She is able to eat and drink well, VS and weight are stable. Labs reviewed. WBC elevated likely from udenyca and decadron. ALK PHOS slightly elevated, possibly from chemotherapy. Will cancel IV fluids today, she will continue to hydrate orally. She will return in 2 weeks for f/u and cycle 2.   All questions were answered. The patient knows to call the clinic with any problems, questions or concerns. No barriers to learning was detected.     Alla Feeling, NP 02/26/19

## 2019-02-26 ENCOUNTER — Inpatient Hospital Stay (HOSPITAL_BASED_OUTPATIENT_CLINIC_OR_DEPARTMENT_OTHER): Payer: PPO | Admitting: Nurse Practitioner

## 2019-02-26 ENCOUNTER — Telehealth: Payer: Self-pay | Admitting: Nurse Practitioner

## 2019-02-26 ENCOUNTER — Other Ambulatory Visit: Payer: Self-pay

## 2019-02-26 ENCOUNTER — Inpatient Hospital Stay: Payer: PPO

## 2019-02-26 ENCOUNTER — Encounter: Payer: Self-pay | Admitting: Nurse Practitioner

## 2019-02-26 VITALS — BP 138/74 | HR 91 | Temp 97.6°F | Resp 18 | Ht 60.0 in | Wt 147.8 lb

## 2019-02-26 DIAGNOSIS — C50212 Malignant neoplasm of upper-inner quadrant of left female breast: Secondary | ICD-10-CM

## 2019-02-26 DIAGNOSIS — Z170 Estrogen receptor positive status [ER+]: Secondary | ICD-10-CM

## 2019-02-26 DIAGNOSIS — E039 Hypothyroidism, unspecified: Secondary | ICD-10-CM

## 2019-02-26 DIAGNOSIS — Z5111 Encounter for antineoplastic chemotherapy: Secondary | ICD-10-CM | POA: Diagnosis not present

## 2019-02-26 LAB — CMP (CANCER CENTER ONLY)
ALT: 14 U/L (ref 0–44)
AST: 25 U/L (ref 15–41)
Albumin: 3.5 g/dL (ref 3.5–5.0)
Alkaline Phosphatase: 136 U/L — ABNORMAL HIGH (ref 38–126)
Anion gap: 8 (ref 5–15)
BUN: 13 mg/dL (ref 8–23)
CO2: 25 mmol/L (ref 22–32)
Calcium: 8.6 mg/dL — ABNORMAL LOW (ref 8.9–10.3)
Chloride: 109 mmol/L (ref 98–111)
Creatinine: 0.73 mg/dL (ref 0.44–1.00)
GFR, Est AFR Am: >60 mL/min (ref >60)
GFR, Estimated: >60 mL/min (ref >60)
Glucose, Bld: 103 mg/dL — ABNORMAL HIGH (ref 70–99)
Potassium: 4.2 mmol/L (ref 3.5–5.1)
Sodium: 142 mmol/L (ref 135–145)
Total Bilirubin: 0.4 mg/dL (ref 0.3–1.2)
Total Protein: 6.4 g/dL — ABNORMAL LOW (ref 6.5–8.1)

## 2019-02-26 LAB — CBC WITH DIFFERENTIAL (CANCER CENTER ONLY)
Abs Immature Granulocytes: 1.89 10*3/uL — ABNORMAL HIGH (ref 0.00–0.07)
Basophils Absolute: 0 10*3/uL (ref 0.0–0.1)
Basophils Relative: 0 %
Eosinophils Absolute: 0 10*3/uL (ref 0.0–0.5)
Eosinophils Relative: 0 %
HCT: 41.3 % (ref 36.0–46.0)
Hemoglobin: 13.4 g/dL (ref 12.0–15.0)
Immature Granulocytes: 12 %
Lymphocytes Relative: 18 %
Lymphs Abs: 2.9 10*3/uL (ref 0.7–4.0)
MCH: 29 pg (ref 26.0–34.0)
MCHC: 32.4 g/dL (ref 30.0–36.0)
MCV: 89.4 fL (ref 80.0–100.0)
Monocytes Absolute: 2.2 10*3/uL — ABNORMAL HIGH (ref 0.1–1.0)
Monocytes Relative: 14 %
Neutro Abs: 8.6 10*3/uL — ABNORMAL HIGH (ref 1.7–7.7)
Neutrophils Relative %: 56 %
Platelet Count: 128 10*3/uL — ABNORMAL LOW (ref 150–400)
RBC: 4.62 MIL/uL (ref 3.87–5.11)
RDW: 12.6 % (ref 11.5–15.5)
WBC Count: 15.6 10*3/uL — ABNORMAL HIGH (ref 4.0–10.5)
nRBC: 0.4 % — ABNORMAL HIGH (ref 0.0–0.2)

## 2019-02-26 LAB — TSH: TSH: 1.587 u[IU]/mL (ref 0.308–3.960)

## 2019-02-26 NOTE — Progress Notes (Signed)
Per [name] NP sent today's TSH to her endocrinologist: [name] (fax# [phone]) fax status sent back ok

## 2019-02-26 NOTE — Telephone Encounter (Signed)
Gave avs and calendar ° °

## 2019-03-10 NOTE — Progress Notes (Signed)
[name]   [phone] [phone]   Clinic Follow up Note   Patient Care Team: [name], DO as PCP - General (Family Medicine) Erroll Luna, MD as Consulting Physician (General Surgery) [name], MD as Consulting Physician (Hematology) Thea Silversmith, MD as Consulting Physician (Radiation Oncology) Rockwell Germany, RN as Registered Nurse Mauro Kaufmann, RN as Registered Nurse Holley Bouche, NP (Inactive) as Nurse Practitioner (Nurse Practitioner) Sylvan Cheese, NP as Nurse Practitioner (Nurse Practitioner)  Date of Service:  03/12/2019  CHIEF COMPLAINT: F/u leftbreast cancer   SUMMARY OF ONCOLOGIC HISTORY: Oncology History Overview Note  Cancer Staging Breast cancer of upper-inner quadrant of right female breast [facility]) Staging form: Breast, AJCC 7th Edition - Clinical stage from 09/23/2014: Stage 0 (Tis (DCIS), N0, M0) - Unsigned - Pathologic stage from 10/22/2014: Stage 0 (Tis (DCIS), N0, cM0) - Signed by [name], MD on 11/03/2014 Staging comments: Staged on final lumpectomy specimen by Dr. [name]  Malignant neoplasm of upper-inner quadrant of left breast in female, estrogen receptor positive ([place]) Staging form: Breast, AJCC 8th Edition - Clinical: cT1b, cN0, cM0, G2, ER+, PR+ - Unsigned    Breast cancer of upper-inner quadrant of right female breast ([place])  09/11/2014 Mammogram   Right breast: area of pleomorphic calcifications and associated architectural distortion, with irregular density at the 1 o'clock position of the right breast. No other suspicious findings in the right breast.    09/11/2014 Breast US   Right breast: area of hypoechogenecity 2 cm x 0.9 cm x 1.6 cm in size, 12 o'clock position, 3 cm the nipple. No discrete suspicious mass.   09/11/2014 Initial Biopsy   Right breast needle core bx (11-12 o'clock, UOQ): DCIS, grade 3, with necrosis and calcifications, ER- (0%), PR- (0%)   09/11/2014 Pathologic  Stage   Stage 0: Tis Nx    09/17/2014 Breast MRI   Non mass enhancement in the upper and slight outer right breast measuring approximately 2.6 x 1.7 x 2.2 cm. Biopsy proven fibrocystic changes in the upper outer periareolar right breast manifested as a 9 x 7 mm mass.   09/24/2014 Procedure   Genetic testing: BRCA plus and BreastNext panels ([place]) performed showing no clinically significant variants detected including ATM, BARD1, BRCA1, BRCA2, BRIP1, CDH1, CHEK2, MRE11A, MUTYH, NBN, NF1, PALB2, PTEN, RAD50, RAD51C, RAD51D, and TP53.   10/20/2014 Definitive Surgery   Right lumpectomy with SLNB ([place]): DCIS with necrosis and calcifications, grade 3, 1 mm from nearest margin (posterior), 0/2 LN with evidence of malignancy.   10/20/2014 Clinical Stage   Stage 0: Tis N0   11/25/2014 - 01/06/2015 Radiation Therapy   Adjuvant RT completed [name]): Right breast 50 Gy over 25 fractions; right breast boost 10 Gy over 5 fractions. Total dose received: 60 Gy.    04/13/2015 Survivorship   Survivorship care plan completed and copy mailed to patient as she was unable to be seen in the Survivorship clinic due to patient's schedule.   11/12/2018 Genetic Testing   Negative genetic testing on the common hereditary cancer panel.  The Hereditary Gene Panel offered by [facility] includes sequencing and/or deletion duplication testing of the following 48 genes: APC, ATM, AXIN2, BARD1, BMPR1A, BRCA1, BRCA2, BRIP1, CDH1, CDK4, CDKN2A (p14ARF), CDKN2A (p16INK4a), CHEK2, CTNNA1, DICER1, EPCAM (Deletion/duplication testing only), GREM1 (promoter region deletion/duplication testing only), KIT, MEN1, MLH1, MSH2, MSH3, MSH6, MUTYH, NBN, NF1, NHTL1, PALB2, PDGFRA, PMS2, POLD1, POLE, PTEN, RAD50, RAD51C, RAD51D, RNF43, SDHB, SDHC, SDHD, SMAD4, SMARCA4. STK11,  TP53, TSC1, TSC2, and VHL.  The following genes were evaluated for sequence changes only: SDHA and HOXB13 c.251G>A variant only. The report date is 11/12/2018.  AXIN2 c.815+fdup  (Intronic) VUS identified.  This is still a normal result and will not change medical management.   Malignant neoplasm of upper-inner quadrant of left breast in female, estrogen receptor positive ([place])  10/18/2018 Initial Diagnosis   Malignant neoplasm of upper-inner quadrant of left breast in female, estrogen receptor positive ([place])   10/18/2018 Initial Biopsy   Diagnosis Breast, left, needle core biopsy, 10 o'clock, 4 cm fn - INVASIVE MAMMARY CARCINOMA - MAMMARY CARCINOMA IN SITU - SEE COMMENT   10/18/2018 Receptors her2   ER: 100% with strong staining  PR: 80% with strong staining  HER2 Negative    10/18/2018 Imaging   Diagnostic Mammogram 10/18/18  IMPRESSION 2 hypoechoic masses in the left breast at 10 o'clock, 4 cm from the nipple. The larger mass measures 5 x 6 x 5 mm. The second mass measures 3 x 5 x 5 mm. The masses are 7 mm apart and span a total dimension 1.7 cm. No axillary adenopathy on the left.   10/18/2018 Cancer Staging   Staging form: Breast, AJCC 8th Edition - Clinical stage from 10/18/2018: Stage IA (cT1b, cN0, cM0, G2, ER+, PR+, HER2-) - Signed by [name], MD on 11/07/2018   10/30/2018 Breast MRI   Breast MRI 10/30/18  IMPRESSION: Biopsy-proven malignancy within the left breast 10 o'clock position. No additional suspicious areas of enhancement identified within either breast.   11/2018 -  Anti-estrogen oral therapy   Anastrozole 59m daily starting 11/2018 (before surgery). Held after 01/27/19 for further cancer treatment.    11/12/2018 Genetic Testing   Negative genetic testing on the common hereditary cancer panel.  The Hereditary Gene Panel offered by [facility] includes sequencing and/or deletion duplication testing of the following 48 genes: APC, ATM, AXIN2, BARD1, BMPR1A, BRCA1, BRCA2, BRIP1, CDH1, CDK4, CDKN2A (p14ARF), CDKN2A (p16INK4a), CHEK2, CTNNA1, DICER1, EPCAM (Deletion/duplication testing only), GREM1 (promoter region deletion/duplication testing only),  KIT, MEN1, MLH1, MSH2, MSH3, MSH6, MUTYH, NBN, NF1, NHTL1, PALB2, PDGFRA, PMS2, POLD1, POLE, PTEN, RAD50, RAD51C, RAD51D, RNF43, SDHB, SDHC, SDHD, SMAD4, SMARCA4. STK11, TP53, TSC1, TSC2, and VHL.  The following genes were evaluated for sequence changes only: SDHA and HOXB13 c.251G>A variant only. The report date is 11/12/2018.  AXIN2 c.815+fdup (Intronic) VUS identified.  This is still a normal result and will not change medical management.   01/07/2019 Surgery   LEFT BREAST LUMPECTOMY WITH RADIOACTIVE SEED AND LEFT DEEP AXILLARY SENTINEL LYMPH NODE BIOPSY WITH BLUE DYE INJECTION by Dr IDalbert Batman6/2/20    01/07/2019 Pathology Results   Diagnosis 01/07/19 1. Breast, lumpectomy, Left w/seed - INVASIVE LOBULAR CARCINOMA, GRADE 2, 1.5 CM. SEE NOTE - CARCINOMA IS 0.4 CM FROM THE SUPERIOR MARGIN AND 0.6 CM FROM THE INFERIOR MARGIN - NO EVIDENCE OF LYMPHOVASCULAR OR PERINEURAL INVASION - BIOPSY SITE CHANGES - SEE ONCOLOGY TABLE 2. Lymph node, sentinel, biopsy, Left axillary #1 - LYMPH NODE, NEGATIVE FOR CARCINOMA (0/1). SEE NOTE 3. Lymph node, sentinel, biopsy, Left axillary #2 - LYMPH NODE, NEGATIVE FOR CARCINOMA (0/1). SEE NOTE 4. Lymph node, sentinel, biopsy, Left - LYMPH NODE, NEGATIVE FOR CARCINOMA (0/1). SEE NOTE 5. Lymph node, sentinel, biopsy, Left - LYMPH NODE, NEGATIVE FOR CARCINOMA (0/1). SEE NOTE 6. Lymph node, sentinel, biopsy, Left axillary #3 - LYMPH NODE, NEGATIVE FOR CARCINOMA (0/1). SEE NOTE   01/07/2019 Cancer Staging   Staging form: Breast,  AJCC 8th Edition - Pathologic stage from 01/07/2019: Stage IA (pT1c, pN0, cM0, G2, ER+, PR+, HER2-, Oncotype DX score: 33) - Signed by [name], MD on 01/27/2019   02/19/2019 -  Adjuvant Chemotherapy   Adjuvant TC every 3 weeks for 4 cycles starting 02/19/19.       CURRENT THERAPY:  -Anastrozole 19m daily starting 11/2018 (before surgery), held on 01/27/19 for further cancer treatment  -Adjuvant TC every 3 weeks for 4 cycles starting 02/19/19.    INTERVAL HISTORY:  Jocelyn FRAPPIERis here for a follow up and treatment. She presents to the clinic alone. She notes her first infusion went well but for a week after her Udenyca injection she had whole body aches and very fatigued. She even tried Claritin and tylenol. She still wants to continue it. She denies nay diarrhea or nausea from chemo. She maintained adequate appetite.     REVIEW OF SYSTEMS:   Constitutional: Denies fevers, chills or abnormal weight loss Eyes: Denies blurriness of vision Ears, nose, mouth, throat, and face: Denies mucositis or sore throat Respiratory: Denies cough, dyspnea or wheezes Cardiovascular: Denies palpitation, chest discomfort or lower extremity swelling Gastrointestinal:  Denies nausea, heartburn or change in bowel habits Skin: Denies abnormal skin rashes Lymphatics: Denies new lymphadenopathy or easy bruising Neurological:Denies numbness, tingling or new weaknesses Behavioral/Psych: Mood is stable, no new changes  All other systems were reviewed with the patient and are negative.  MEDICAL HISTORY:  Past Medical History:  Diagnosis Date   Allergy    Breast cancer ([facility] 09/11/14   right breast   Breast cancer of upper-inner quadrant of right female breast ([facility] 09/15/2014   Family history of breast cancer    Family history of colon cancer    Family history of pancreatic cancer    Hot flashes    Hyperlipidemia    Hypothyroidism    Personal history of radiation therapy 2016   Radiation 11/25/14-01/06/15   Right Breast    SURGICAL HISTORY: Past Surgical History:  Procedure Laterality Date   AUGMENTATION MAMMAPLASTY Bilateral 03/10/1997   BREAST BIOPSY Right 09/11/2014   BREAST LUMPECTOMY Right 10/20/2014   BREAST LUMPECTOMY WITH RADIOACTIVE SEED AND SENTINEL LYMPH NODE BIOPSY N/A 01/07/2019   Procedure: LEFT BREAST LUMPECTOMY WITH RADIOACTIVE SEED AND LEFT DEEP AXILLARY SENTINEL LYMPH NODE BIOPSY WITH BLUE DYE INJECTION;  Surgeon:  IFanny Skates MD;  [facility]  Service: General;  Laterality: N/A;   COLONOSCOPY     DILATION AND CURETTAGE OF UTERUS     KNEE ARTHROSCOPY     PLACEMENT OF BREAST IMPLANTS  1995   bilat breast implants   TONSILLECTOMY     TUBAL LIGATION     WISDOM TOOTH EXTRACTION      I have reviewed the social history and family history with the patient and they are unchanged from previous note.  ALLERGIES:  has No Known Allergies.  MEDICATIONS:  Current Outpatient Medications  Medication Sig Dispense Refill   anastrozole (ARIMIDEX) 1 MG tablet Take 1 tablet (1 mg total) by mouth daily. 30 tablet 3   Ascorbic Acid (VITAMIN C) 1000 MG tablet Take 2,000 mg by mouth 3 (three) times a week.     Calcium Carb-Cholecalciferol (CALCIUM 600 + D PO) Take 1 tablet by mouth 2 (two) times a week.     cholecalciferol (VITAMIN D3) 25 MCG (1000 UT) tablet Take 1,000 Units by mouth 3 (three) times a week.     dexamethasone (DECADRON) 4 MG tablet Take  2 tablets (8 mg total) by mouth 2 (two) times daily. Start the day before Taxotere. Then again the day after chemo for 3 days. 30 tablet 1   HYDROcodone-acetaminophen (NORCO) 5-325 MG tablet Take 1-2 tablets by mouth every 6 (six) hours as needed for moderate pain or severe pain. 30 tablet 0   Melatonin 10 MG CAPS Take 10 mg by mouth at bedtime as needed (sleep).     methimazole (TAPAZOLE) 10 MG tablet Take 10 mg by mouth See admin instructions. Take 10 mg all days EXCEPT Sunday     Multiple Vitamin (MULTIVITAMIN WITH MINERALS) TABS tablet Take 1 tablet by mouth 3 (three) times a week.     Naproxen Sod-diphenhydrAMINE (ALEVE PM) 220-25 MG TABS Take 2 tablets by mouth at bedtime as needed (sleep).     naproxen sodium (ALEVE) 220 MG tablet Take 440 mg by mouth 2 (two) times daily as needed (pain).     ondansetron (ZOFRAN) 8 MG tablet Take 1 tablet (8 mg total) by mouth 2 (two) times daily as needed for refractory nausea / vomiting. Start on day 3  after chemo. 30 tablet 1   prochlorperazine (COMPAZINE) 10 MG tablet Take 1 tablet (10 mg total) by mouth every 6 (six) hours as needed (Nausea or vomiting). 30 tablet 1   sertraline (ZOLOFT) 100 MG tablet Take 200 mg by mouth daily.      simvastatin (ZOCOR) 40 MG tablet Take 40 mg by mouth every evening.     No current facility-administered medications for this visit.    Facility-Administered Medications Ordered in Other Visits  Medication Dose Route Frequency Provider Last Rate Last Dose   cyclophosphamide (CYTOXAN) 960 mg in sodium chloride 0.9 % 250 mL chemo infusion  600 mg/m2 (Order-Specific) Intravenous Once [name], MD       DOCEtaxel (TAXOTERE) 120 mg in sodium chloride 0.9 % 250 mL chemo infusion  75 mg/m2 (Order-Specific) Intravenous Once [name], MD        PHYSICAL EXAMINATION: ECOG PERFORMANCE STATUS: 0 - Asymptomatic  There were no vitals filed for this visit. There were no vitals filed for this visit.  GENERAL:alert, no distress and comfortable SKIN: skin color, texture, turgor are normal, no rashes or significant lesions EYES: normal, Conjunctiva are pink and non-injected, sclera clear  NECK: supple, thyroid normal size, non-tender, without nodularity LYMPH:  no palpable lymphadenopathy in the cervical, axillary  LUNGS: clear to auscultation and percussion with normal breathing effort HEART: regular rate & rhythm and no murmurs and no lower extremity edema ABDOMEN:abdomen soft, non-tender and normal bowel sounds Musculoskeletal:no cyanosis of digits and no clubbing  NEURO: alert & oriented x 3 with fluent speech, no focal motor/sensory deficits  LABORATORY DATA:  I have reviewed the data as listed CBC Latest Ref Rng & Units 03/12/2019 02/26/2019 02/19/2019  WBC 4.0 - 10.5 K/uL 16.3(H) 15.6(H) 14.5(H)  Hemoglobin 12.0 - 15.0 g/dL 12.9 13.4 13.8  Hematocrit 36.0 - 46.0 % 39.1 41.3 42.0  Platelets 150 - 400 K/uL 467(H) 128(L) 287     CMP Latest Ref Rng &  Units 03/12/2019 02/26/2019 02/19/2019  Glucose 70 - 99 mg/dL 90 103(H) 111(H)  BUN 8 - 23 mg/dL 17 13 18   Creatinine 0.44 - 1.00 mg/dL 0.75 0.73 0.83  Sodium 135 - 145 mmol/L 143 142 144  Potassium 3.5 - 5.1 mmol/L 4.1 4.2 3.9  Chloride 98 - 111 mmol/L 107 109 109  CO2 22 - 32 mmol/L 24 25 24   Calcium  8.9 - 10.3 mg/dL 10.2 8.6(L) 9.3  Total Protein 6.5 - 8.1 g/dL 7.0 6.4(L) 7.3  Total Bilirubin 0.3 - 1.2 mg/dL 0.3 0.4 0.3  Alkaline Phos 38 - 126 U/L 107 136(H) 123  AST 15 - 41 U/L 15 25 15   ALT 0 - 44 U/L 20 14 20       RADIOGRAPHIC STUDIES: I have personally reviewed the radiological images as listed and agreed with the findings in the report. No results found.   ASSESSMENT & PLAN:  JAHNIYA DUZAN is a 68 y.o. female with   1.Malignant neoplasm of upper-inner quadrant of left breast,invasive lobular carcinoma,StageIA, pT1c,N0,M0, ER+/PR+, HER2-, GradeII -She was diagnosed in 10/2018. She started neoadjuvant anastrozole in 11/2018 due to the delay of her surgery due to [place] pandemic. This was held after 01/27/19 for further cancer treatment.  -She underwent left breast lumpectomy on 01/07/19. We discussed her pathology report in great detail which shows complete resection, node negative.  -Gust her Oncotype DX test results, which showed recurrence score of 33. Her predicted distant recurrence with adjuvant antiestrogen therapy is about 21%. Given her high risk disease, she would benefit from adjuvant chemotherapy followed by radiation and then restarting anti-estrogen with Anastrozole.  -I started her on moderate intensity docetaxel and Cytoxan (TC) every 3 weeks for 4 cycles on 02/19/19. She tolerated first cycle very well except had significant fatigue and bone pain from Udenyca injection. She did try Claritin. I advised her to also try Tylenol. We discussed holding GCSF but she prefers to continue  -Labs reviewed, CBC and CMP WNL except WBC 16.3, PLT 467K, ANC 11.5.. Overall adequate to  proceed with cycle 2 TC today.  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 She is s/p rightlumpectomyand SLNB and adjuvant radiation.  -Given her negative ER/PR status, there is no benefit of antiestrogen therapy. Wepreviouslydiscussed chemoprevention for breast cancer in general, and I do not recommend it given her history of ER/PR negative DCIS -Continuesurveillance. No evidence of recurrence.   3. Genetics -The BRCA plus panel, which includes a BRCA1, BRCA2, CDH1, PALB2, PTEN, TP53 were all negative. -Repeat genetics negative for pathogenetic mutations.   4. Bone health  -She has not had DEXA scan in years  -Will repeat later this year as she will be on anastrozole which can weaken her bone.  PLAN: -Labs reviewed and adequate to proceed with cycle 2 TC today  -Lab, f/u and TC in 3 and 6 weeks    No problem-specific Assessment & Plan notes found for this encounter.   No orders of the defined types were placed in this encounter.  All questions were answered. The patient knows to call the clinic with any problems, questions or concerns. No barriers to learning was detected. I spent 20 minutes counseling the patient face to face. The total time spent in the appointment was 30 minutes and more than 50% was on counseling and review of test results     [name], MD 03/12/2019   I, Joslyn Devon, am acting as scribe for [name], MD.   I have reviewed the above documentation for accuracy and completeness, and I agree with the above.    Addendum  Pt had severe reaction to docetaxel (flushing, and shortness, chest tightness and dizziness), Docetaxel was stopped, she was given Benadryl and Solu-Medrol.  She recovered well.  Due to the allergy reaction, I will change docetaxel to Abraxane  from next cycle.  [name]  03/13/2019

## 2019-03-12 ENCOUNTER — Inpatient Hospital Stay: Payer: PPO

## 2019-03-12 ENCOUNTER — Inpatient Hospital Stay (HOSPITAL_BASED_OUTPATIENT_CLINIC_OR_DEPARTMENT_OTHER): Payer: PPO | Admitting: Medical

## 2019-03-12 ENCOUNTER — Other Ambulatory Visit: Payer: Self-pay

## 2019-03-12 ENCOUNTER — Inpatient Hospital Stay (HOSPITAL_BASED_OUTPATIENT_CLINIC_OR_DEPARTMENT_OTHER): Payer: PPO | Admitting: Hematology

## 2019-03-12 ENCOUNTER — Inpatient Hospital Stay: Payer: PPO | Attending: Hematology

## 2019-03-12 VITALS — BP 124/67 | HR 84 | Temp 98.6°F | Resp 20 | Wt 149.5 lb

## 2019-03-12 DIAGNOSIS — Z5189 Encounter for other specified aftercare: Secondary | ICD-10-CM | POA: Diagnosis not present

## 2019-03-12 DIAGNOSIS — C50212 Malignant neoplasm of upper-inner quadrant of left female breast: Secondary | ICD-10-CM

## 2019-03-12 DIAGNOSIS — C50211 Malignant neoplasm of upper-inner quadrant of right female breast: Secondary | ICD-10-CM | POA: Diagnosis not present

## 2019-03-12 DIAGNOSIS — Z171 Estrogen receptor negative status [ER-]: Secondary | ICD-10-CM | POA: Diagnosis not present

## 2019-03-12 DIAGNOSIS — Z170 Estrogen receptor positive status [ER+]: Secondary | ICD-10-CM | POA: Insufficient documentation

## 2019-03-12 DIAGNOSIS — Z5111 Encounter for antineoplastic chemotherapy: Secondary | ICD-10-CM | POA: Diagnosis not present

## 2019-03-12 DIAGNOSIS — T8090XA Unspecified complication following infusion and therapeutic injection, initial encounter: Secondary | ICD-10-CM

## 2019-03-12 LAB — CBC WITH DIFFERENTIAL (CANCER CENTER ONLY)
Abs Immature Granulocytes: 0.08 10*3/uL — ABNORMAL HIGH (ref 0.00–0.07)
Basophils Absolute: 0.1 10*3/uL (ref 0.0–0.1)
Basophils Relative: 0 %
Eosinophils Absolute: 0 10*3/uL (ref 0.0–0.5)
Eosinophils Relative: 0 %
HCT: 39.1 % (ref 36.0–46.0)
Hemoglobin: 12.9 g/dL (ref 12.0–15.0)
Immature Granulocytes: 1 %
Lymphocytes Relative: 21 %
Lymphs Abs: 3.4 10*3/uL (ref 0.7–4.0)
MCH: 29.3 pg (ref 26.0–34.0)
MCHC: 33 g/dL (ref 30.0–36.0)
MCV: 88.9 fL (ref 80.0–100.0)
Monocytes Absolute: 1.2 10*3/uL — ABNORMAL HIGH (ref 0.1–1.0)
Monocytes Relative: 8 %
Neutro Abs: 11.5 10*3/uL — ABNORMAL HIGH (ref 1.7–7.7)
Neutrophils Relative %: 70 %
Platelet Count: 467 10*3/uL — ABNORMAL HIGH (ref 150–400)
RBC: 4.4 MIL/uL (ref 3.87–5.11)
RDW: 13.1 % (ref 11.5–15.5)
WBC Count: 16.3 10*3/uL — ABNORMAL HIGH (ref 4.0–10.5)
nRBC: 0 % (ref 0.0–0.2)

## 2019-03-12 LAB — CMP (CANCER CENTER ONLY)
ALT: 20 U/L (ref 0–44)
AST: 15 U/L (ref 15–41)
Albumin: 4.1 g/dL (ref 3.5–5.0)
Alkaline Phosphatase: 107 U/L (ref 38–126)
Anion gap: 12 (ref 5–15)
BUN: 17 mg/dL (ref 8–23)
CO2: 24 mmol/L (ref 22–32)
Calcium: 10.2 mg/dL (ref 8.9–10.3)
Chloride: 107 mmol/L (ref 98–111)
Creatinine: 0.75 mg/dL (ref 0.44–1.00)
GFR, Est AFR Am: >60 mL/min (ref >60)
GFR, Estimated: >60 mL/min (ref >60)
Glucose, Bld: 90 mg/dL (ref 70–99)
Potassium: 4.1 mmol/L (ref 3.5–5.1)
Sodium: 143 mmol/L (ref 135–145)
Total Bilirubin: 0.3 mg/dL (ref 0.3–1.2)
Total Protein: 7 g/dL (ref 6.5–8.1)

## 2019-03-12 MED ORDER — DEXAMETHASONE SODIUM PHOSPHATE 10 MG/ML IJ SOLN
INTRAMUSCULAR | Status: AC
  Filled 2019-03-12: qty 1

## 2019-03-12 MED ORDER — PALONOSETRON HCL INJECTION 0.25 MG/5ML
0.2500 mg | Freq: Once | INTRAVENOUS | Status: AC
  Administered 2019-03-12: 0.25 mg via INTRAVENOUS

## 2019-03-12 MED ORDER — SODIUM CHLORIDE 0.9 % IV SOLN
600.0000 mg/m2 | Freq: Once | INTRAVENOUS | Status: AC
  Administered 2019-03-12: 960 mg via INTRAVENOUS
  Filled 2019-03-12: qty 48

## 2019-03-12 MED ORDER — DEXAMETHASONE SODIUM PHOSPHATE 10 MG/ML IJ SOLN
10.0000 mg | Freq: Once | INTRAMUSCULAR | Status: AC
  Administered 2019-03-12: 10 mg via INTRAVENOUS

## 2019-03-12 MED ORDER — DIPHENHYDRAMINE HCL 50 MG/ML IJ SOLN
50.0000 mg | Freq: Once | INTRAMUSCULAR | Status: AC | PRN
  Administered 2019-03-12: 50 mg via INTRAVENOUS

## 2019-03-12 MED ORDER — METHYLPREDNISOLONE SODIUM SUCC 125 MG IJ SOLR: 125.0000 mg | Freq: Once | INTRAMUSCULAR | Status: DC | PRN

## 2019-03-12 MED ORDER — SODIUM CHLORIDE 0.9 % IV SOLN
Freq: Once | INTRAVENOUS | Status: AC
  Administered 2019-03-12: 12:00:00 via INTRAVENOUS
  Filled 2019-03-12: qty 250

## 2019-03-12 MED ORDER — PALONOSETRON HCL INJECTION 0.25 MG/5ML
INTRAVENOUS | Status: AC
  Filled 2019-03-12: qty 5

## 2019-03-12 MED ORDER — SODIUM CHLORIDE 0.9 % IV SOLN
75.0000 mg/m2 | Freq: Once | INTRAVENOUS | Status: AC
  Administered 2019-03-12: 120 mg via INTRAVENOUS
  Filled 2019-03-12: qty 12

## 2019-03-12 NOTE — Patient Instructions (Addendum)
[facility] Discharge Instructions for Patients Receiving Chemotherapy  Today you received the following chemotherapy agents: Taxotere, Cytoxan  Taxotere discontinued per Dr. Burr Medico  To help prevent nausea and vomiting after your treatment, we encourage you to take your nausea medication as directed by your MD.   If you develop nausea and vomiting that is not controlled by your nausea medication, call the clinic.   BELOW ARE SYMPTOMS THAT SHOULD BE REPORTED IMMEDIATELY:  *FEVER GREATER THAN 100.5 F  *CHILLS WITH OR WITHOUT FEVER  NAUSEA AND VOMITING THAT IS NOT CONTROLLED WITH YOUR NAUSEA MEDICATION  *UNUSUAL SHORTNESS OF BREATH  *UNUSUAL BRUISING OR BLEEDING  TENDERNESS IN MOUTH AND THROAT WITH OR WITHOUT PRESENCE OF ULCERS  *URINARY PROBLEMS  *BOWEL PROBLEMS  UNUSUAL RASH Items with * indicate a potential emergency and should be followed up as soon as possible.  Feel free to call the clinic should you have any questions or concerns. The clinic phone number is [phone].  Please show the [facility] at check-in to the Emergency Department and triage nurse.  Coronavirus (COVID-19) Are you at risk?  Are you at risk for the Coronavirus (COVID-19)?  To be considered HIGH RISK for Coronavirus (COVID-19), you have to meet the following criteria:  . Traveled to [place], [place], [place], [place] or [place]; or in the [place] to [place], [place], [place], or [place]; and have fever, cough, and shortness of breath within the last 2 weeks of travel OR . Been in close contact with a person diagnosed with COVID-19 within the last 2 weeks and have fever, cough, and shortness of breath . IF YOU DO NOT MEET THESE CRITERIA, YOU ARE CONSIDERED LOW RISK FOR COVID-19.  What to do if you are HIGH RISK for COVID-19?  Marland Kitchen If you are having a medical emergency, call 911. . Seek medical care right away. Before you go to a doctor's office, urgent care or  emergency department, call ahead and tell them about your recent travel, contact with someone diagnosed with COVID-19, and your symptoms. You should receive instructions from your physician's office regarding next steps of care.  . When you arrive at healthcare provider, tell the healthcare staff immediately you have returned from visiting [place], [place], [place], [place] or [place]; or traveled in the [place] to [place], [place], [place], or [place]; in the last two weeks or you have been in close contact with a person diagnosed with COVID-19 in the last 2 weeks.   . Tell the health care staff about your symptoms: fever, cough and shortness of breath. . After you have been seen by a medical provider, you will be either: o Tested for (COVID-19) and discharged home on quarantine except to seek medical care if symptoms worsen, and asked to  - Stay home and avoid contact with others until you get your results (4-5 days)  - Avoid travel on public transportation if possible (such as bus, train, or airplane) or o Sent to the Emergency Department by EMS for evaluation, COVID-19 testing, and possible admission depending on your condition and test results.  What to do if you are LOW RISK for COVID-19?  Reduce your risk of any infection by using the same precautions used for avoiding the common cold or flu:  Marland Kitchen Wash your hands often with soap and warm water for at least 20 seconds.  If soap and water are not readily available, use an alcohol-based hand sanitizer with  at least 60% alcohol.  . If coughing or sneezing, cover your mouth and nose by coughing or sneezing into the elbow areas of your shirt or coat, into a tissue or into your sleeve (not your hands). . Avoid shaking hands with others and consider head nods or verbal greetings only. . Avoid touching your eyes, nose, or mouth with unwashed hands.  . Avoid close contact with people who are sick. . Avoid places or events with large numbers  of people in one location, like concerts or sporting events. . Carefully consider travel plans you have or are making. . If you are planning any travel outside or inside the [place], visit the CDC's Travelers' Health webpage for the latest health notices. . If you have some symptoms but not all symptoms, continue to monitor at home and seek medical attention if your symptoms worsen. . If you are having a medical emergency, call 911.   [place] / e-Visit: [email or web address]         [facility]: [facility]: [phone]                   [facility]: [phone]

## 2019-03-12 NOTE — Progress Notes (Signed)
Taxol started at 1403 and pt. had reaction at 1410 shortness of breath, states I "couldn't breathe", chest pressure, flushed/red face noted. Received small amount of Taxol. Sandi Mealy notified and at side for assessment. Started Normal Saline bolus, medicated with Benadryl 50 mg IV and Solumedrol 125 mg IV. Dr. Burr Medico notified and in for observation. Vital signs stable. Per Dr. Burr Medico pt. to continue with Cytoxan today and pt. instructed to take Benadryl po at home and her Decadron po as prescribed. Pt. rested, no shortness of breath noted, denies chest pressure, and no flushness/redness noted. Pt. tolerated Cytoxan well and left via ambulation. No respiratory distress noted.

## 2019-03-12 NOTE — Progress Notes (Signed)
DATE:  03/12/2019                                          X  CHEMO/IMMUNOTHERAPY REACTION            MD:  Dr. [name]   AGENT/BLOOD PRODUCT RECEIVING TODAY:              Taxotere and cyclophosphamide   AGENT/BLOOD PRODUCT RECEIVING IMMEDIATELY PRIOR TO REACTION:           Taxotere   VS: BP:      149/81   P:        103       SPO2:        96% on room air                BP:      134/72   P:        97        SPO2:        97% on room air     REACTION(S):            Facial and chest erythema, shortness of breath, chest pressure, and dizziness   PREMEDS:      Aloxi and Decadron   INTERVENTION: Taxotere was paused and the patient was given Benadryl 50 mg IV x1 and Solu-Medrol 125 mg IV x1.   Review of Systems  Review of Systems  Constitutional: Negative for chills, diaphoresis and fever.  HENT: Negative for trouble swallowing and voice change.   Respiratory: Positive for shortness of breath. Negative for cough, chest tightness and wheezing.        Chest pressure  Cardiovascular: Negative for chest pain and palpitations.  Gastrointestinal: Negative for abdominal pain, constipation, diarrhea, nausea and vomiting.  Musculoskeletal: Negative for back pain and myalgias.  Skin:       Facial and chest erythema.  Neurological: Positive for dizziness. Negative for light-headedness and headaches.     Physical Exam  Physical Exam Constitutional:      General: She is not in acute distress.    Appearance: She is not diaphoretic.  HENT:     Head: Normocephalic and atraumatic.  Eyes:     Comments: Patient was noted to have scleral injection which resolved after pausing taxotere and receiving IV Benadryl and IV Solu-Medrol.  Cardiovascular:     Rate and Rhythm: Normal rate and regular rhythm.     Heart sounds: Normal heart sounds. No murmur. No friction rub. No gallop.   Pulmonary:     Effort: Pulmonary effort is normal. No respiratory distress.     Breath sounds: Normal breath sounds.  No wheezing or rales.  Skin:    General: Skin is warm and dry.     Findings: Erythema present. No rash.     Comments: Patient was noted to have facial and chest erythema which resolved after pausing taxotere and receiving IV Benadryl and IV Solu-Medrol.  Neurological:     Mental Status: She is alert.     OUTCOME:                 The patient was seen with Dr. Burr Medico.  She was not rechallenged with taxotere.  She was given cyclophosphamide which she completed without any issues of concern.  Dr. Burr Medico plans to switch the patient to Abraxane.  Sandi Mealy, MHS, PA-C

## 2019-03-13 ENCOUNTER — Telehealth: Payer: Self-pay

## 2019-03-13 ENCOUNTER — Telehealth: Payer: Self-pay | Admitting: Hematology

## 2019-03-13 ENCOUNTER — Telehealth: Payer: Self-pay | Admitting: Medical

## 2019-03-13 ENCOUNTER — Encounter: Payer: Self-pay | Admitting: Hematology

## 2019-03-13 NOTE — Telephone Encounter (Signed)
No los per 8/5. °

## 2019-03-13 NOTE — Telephone Encounter (Signed)
Spoke with patient for follow up after chemotherapy reaction yesterday.  She states she is doing really well, no complaints.  Explained she does not have to come in for injection tomorrow, will cancel.  She verbalized an understanding.

## 2019-03-14 ENCOUNTER — Ambulatory Visit: Payer: PPO

## 2019-03-17 ENCOUNTER — Encounter: Payer: Self-pay | Admitting: Pharmacist

## 2019-03-28 DIAGNOSIS — M8580 Other specified disorders of bone density and structure, unspecified site: Secondary | ICD-10-CM | POA: Diagnosis not present

## 2019-03-28 DIAGNOSIS — E785 Hyperlipidemia, unspecified: Secondary | ICD-10-CM | POA: Diagnosis not present

## 2019-03-28 DIAGNOSIS — E0590 Thyrotoxicosis, unspecified without thyrotoxic crisis or storm: Secondary | ICD-10-CM | POA: Diagnosis not present

## 2019-03-28 NOTE — Progress Notes (Signed)
[facility]   Telephone:([phone]) [phone] Fax:([phone]) [phone]   Clinic Follow up Note   Patient Care Team: [name], DO as PCP - General (Family Medicine) Erroll Luna, MD as Consulting Physician (General Surgery) [name], MD as Consulting Physician (Hematology) Thea Silversmith, MD as Consulting Physician (Radiation Oncology) Rockwell Germany, RN as Registered Nurse Mauro Kaufmann, RN as Registered Nurse Holley Bouche, NP (Inactive) as Nurse Practitioner (Nurse Practitioner) Sylvan Cheese, NP as Nurse Practitioner (Nurse Practitioner)  Date of Service:  04/02/2019  CHIEF COMPLAINT: F/u leftbreast cancer   SUMMARY OF ONCOLOGIC HISTORY: Oncology History Overview Note  Cancer Staging Breast cancer of upper-inner quadrant of right female breast [facility]) Staging form: Breast, AJCC 7th Edition - Clinical stage from 09/23/2014: Stage 0 (Tis (DCIS), N0, M0) - Unsigned - Pathologic stage from 10/22/2014: Stage 0 (Tis (DCIS), N0, cM0) - Signed by [name], MD on 11/03/2014 Staging comments: Staged on final lumpectomy specimen by Dr. [name]  Malignant neoplasm of upper-inner quadrant of left breast in female, estrogen receptor positive ([place]) Staging form: Breast, AJCC 8th Edition - Clinical: cT1b, cN0, cM0, G2, ER+, PR+ - Unsigned    Breast cancer of upper-inner quadrant of right female breast ([place])  09/11/2014 Mammogram   Right breast: area of pleomorphic calcifications and associated architectural distortion, with irregular density at the 1 o'clock position of the right breast. No other suspicious findings in the right breast.    09/11/2014 Breast US   Right breast: area of hypoechogenecity 2 cm x 0.9 cm x 1.6 cm in size, 12 o'clock position, 3 cm the nipple. No discrete suspicious mass.   09/11/2014 Initial Biopsy   Right breast needle core bx (11-12 o'clock, UOQ): DCIS, grade 3, with necrosis and calcifications, ER- (0%), PR- (0%)   09/11/2014 Pathologic  Stage   Stage 0: Tis Nx    09/17/2014 Breast MRI   Non mass enhancement in the upper and slight outer right breast measuring approximately 2.6 x 1.7 x 2.2 cm. Biopsy proven fibrocystic changes in the upper outer periareolar right breast manifested as a 9 x 7 mm mass.   09/24/2014 Procedure   Genetic testing: BRCA plus and BreastNext panels ([place]) performed showing no clinically significant variants detected including ATM, BARD1, BRCA1, BRCA2, BRIP1, CDH1, CHEK2, MRE11A, MUTYH, NBN, NF1, PALB2, PTEN, RAD50, RAD51C, RAD51D, and TP53.   10/20/2014 Definitive Surgery   Right lumpectomy with SLNB ([place]): DCIS with necrosis and calcifications, grade 3, 1 mm from nearest margin (posterior), 0/2 LN with evidence of malignancy.   10/20/2014 Clinical Stage   Stage 0: Tis N0   11/25/2014 - 01/06/2015 Radiation Therapy   Adjuvant RT completed [name]): Right breast 50 Gy over 25 fractions; right breast boost 10 Gy over 5 fractions. Total dose received: 60 Gy.    04/13/2015 Survivorship   Survivorship care plan completed and copy mailed to patient as she was unable to be seen in the Survivorship clinic due to patient's schedule.   11/12/2018 Genetic Testing   Negative genetic testing on the common hereditary cancer panel.  The Hereditary Gene Panel offered by [facility] includes sequencing and/or deletion duplication testing of the following 48 genes: APC, ATM, AXIN2, BARD1, BMPR1A, BRCA1, BRCA2, BRIP1, CDH1, CDK4, CDKN2A (p14ARF), CDKN2A (p16INK4a), CHEK2, CTNNA1, DICER1, EPCAM (Deletion/duplication testing only), GREM1 (promoter region deletion/duplication testing only), KIT, MEN1, MLH1, MSH2, MSH3, MSH6, MUTYH, NBN, NF1, NHTL1, PALB2, PDGFRA, PMS2, POLD1, POLE, PTEN, RAD50, RAD51C, RAD51D, RNF43, SDHB, SDHC, SDHD, SMAD4, SMARCA4. STK11,  TP53, TSC1, TSC2, and VHL.  The following genes were evaluated for sequence changes only: SDHA and HOXB13 c.251G>A variant only. The report date is 11/12/2018.  AXIN2 c.815+fdup  (Intronic) VUS identified.  This is still a normal result and will not change medical management.   Malignant neoplasm of upper-inner quadrant of left breast in female, estrogen receptor positive ([place])  10/18/2018 Initial Diagnosis   Malignant neoplasm of upper-inner quadrant of left breast in female, estrogen receptor positive ([place])   10/18/2018 Initial Biopsy   Diagnosis Breast, left, needle core biopsy, 10 o'clock, 4 cm fn - INVASIVE MAMMARY CARCINOMA - MAMMARY CARCINOMA IN SITU - SEE COMMENT   10/18/2018 Receptors her2   ER: 100% with strong staining  PR: 80% with strong staining  HER2 Negative    10/18/2018 Imaging   Diagnostic Mammogram 10/18/18  IMPRESSION 2 hypoechoic masses in the left breast at 10 o'clock, 4 cm from the nipple. The larger mass measures 5 x 6 x 5 mm. The second mass measures 3 x 5 x 5 mm. The masses are 7 mm apart and span a total dimension 1.7 cm. No axillary adenopathy on the left.   10/18/2018 Cancer Staging   Staging form: Breast, AJCC 8th Edition - Clinical stage from 10/18/2018: Stage IA (cT1b, cN0, cM0, G2, ER+, PR+, HER2-) - Signed by [name], MD on 11/07/2018   10/30/2018 Breast MRI   Breast MRI 10/30/18  IMPRESSION: Biopsy-proven malignancy within the left breast 10 o'clock position. No additional suspicious areas of enhancement identified within either breast.   11/2018 -  Anti-estrogen oral therapy   Anastrozole 57m daily starting 11/2018 (before surgery). Held after 01/27/19 for further cancer treatment.    11/12/2018 Genetic Testing   Negative genetic testing on the common hereditary cancer panel.  The Hereditary Gene Panel offered by [facility] includes sequencing and/or deletion duplication testing of the following 48 genes: APC, ATM, AXIN2, BARD1, BMPR1A, BRCA1, BRCA2, BRIP1, CDH1, CDK4, CDKN2A (p14ARF), CDKN2A (p16INK4a), CHEK2, CTNNA1, DICER1, EPCAM (Deletion/duplication testing only), GREM1 (promoter region deletion/duplication testing only),  KIT, MEN1, MLH1, MSH2, MSH3, MSH6, MUTYH, NBN, NF1, NHTL1, PALB2, PDGFRA, PMS2, POLD1, POLE, PTEN, RAD50, RAD51C, RAD51D, RNF43, SDHB, SDHC, SDHD, SMAD4, SMARCA4. STK11, TP53, TSC1, TSC2, and VHL.  The following genes were evaluated for sequence changes only: SDHA and HOXB13 c.251G>A variant only. The report date is 11/12/2018.  AXIN2 c.815+fdup (Intronic) VUS identified.  This is still a normal result and will not change medical management.   01/07/2019 Surgery   LEFT BREAST LUMPECTOMY WITH RADIOACTIVE SEED AND LEFT DEEP AXILLARY SENTINEL LYMPH NODE BIOPSY WITH BLUE DYE INJECTION by Dr IDalbert Batman6/2/20    01/07/2019 Pathology Results   Diagnosis 01/07/19 1. Breast, lumpectomy, Left w/seed - INVASIVE LOBULAR CARCINOMA, GRADE 2, 1.5 CM. SEE NOTE - CARCINOMA IS 0.4 CM FROM THE SUPERIOR MARGIN AND 0.6 CM FROM THE INFERIOR MARGIN - NO EVIDENCE OF LYMPHOVASCULAR OR PERINEURAL INVASION - BIOPSY SITE CHANGES - SEE ONCOLOGY TABLE 2. Lymph node, sentinel, biopsy, Left axillary #1 - LYMPH NODE, NEGATIVE FOR CARCINOMA (0/1). SEE NOTE 3. Lymph node, sentinel, biopsy, Left axillary #2 - LYMPH NODE, NEGATIVE FOR CARCINOMA (0/1). SEE NOTE 4. Lymph node, sentinel, biopsy, Left - LYMPH NODE, NEGATIVE FOR CARCINOMA (0/1). SEE NOTE 5. Lymph node, sentinel, biopsy, Left - LYMPH NODE, NEGATIVE FOR CARCINOMA (0/1). SEE NOTE 6. Lymph node, sentinel, biopsy, Left axillary #3 - LYMPH NODE, NEGATIVE FOR CARCINOMA (0/1). SEE NOTE   01/07/2019 Cancer Staging   Staging form: Breast,  AJCC 8th Edition - Pathologic stage from 01/07/2019: Stage IA (pT1c, pN0, cM0, G2, ER+, PR+, HER2-, Oncotype DX score: 33) - Signed by [name], MD on 01/27/2019   02/19/2019 -  Adjuvant Chemotherapy   Adjuvant TC every 3 weeks for 4 cycles starting 02/19/19. She had allergic reation to Taxol with C2 during infusion. She was able to complete Cytoxan that cycle but not much Taxol. I switched taxol to Abraxane with C3.       CURRENT THERAPY:    -Anastrozole 40m daily starting 11/2018 (before surgery), held on 01/27/19 for further cancer treatment  -Adjuvant TC every 3 weeks for 4 cycles starting 02/19/19. Due to allergic reaction her Taxol was changed to Abraxane starting with cycle 3.   INTERVAL HISTORY:  Jocelyn LINDENBAUMis here for a follow up and treatment. She presents to the clinic alone. She notes she had an allergic reaction with her cycle 2 AC. She notes she did not have anymore symptoms of reaction when she got home. She notes she took steroids yesterday.    REVIEW OF SYSTEMS:   Constitutional: Denies fevers, chills or abnormal weight loss Eyes: Denies blurriness of vision Ears, nose, mouth, throat, and face: Denies mucositis or sore throat Respiratory: Denies cough, dyspnea or wheezes Cardiovascular: Denies palpitation, chest discomfort or lower extremity swelling Gastrointestinal:  Denies nausea, heartburn or change in bowel habits Skin: Denies abnormal skin rashes Lymphatics: Denies new lymphadenopathy or easy bruising Neurological:Denies numbness, tingling or new weaknesses Behavioral/Psych: Mood is stable, no new changes  All other systems were reviewed with the patient and are negative.  MEDICAL HISTORY:  Past Medical History:  Diagnosis Date   Allergy    [place]   right breast   [place]   Family history of breast cancer    Family history of colon cancer    Family history of pancreatic cancer    Hot flashes    Hyperlipidemia    Hypothyroidism    Personal history of radiation therapy 2016   Radiation 11/25/14-01/06/15   Right Breast    SURGICAL HISTORY: Past Surgical History:  Procedure Laterality Date   AUGMENTATION MAMMAPLASTY Bilateral 03/10/1997   BREAST BIOPSY Right 09/11/2014   BREAST LUMPECTOMY Right 10/20/2014   BREAST LUMPECTOMY WITH RADIOACTIVE SEED AND SENTINEL LYMPH NODE BIOPSY N/A 01/07/2019    Procedure: LEFT BREAST LUMPECTOMY WITH RADIOACTIVE SEED AND LEFT DEEP AXILLARY SENTINEL LYMPH NODE BIOPSY WITH BLUE DYE INJECTION;  Surgeon: IFanny Skates MD;  [facility]  Service: General;  Laterality: N/A;   COLONOSCOPY     DILATION AND CURETTAGE OF UTERUS     KNEE ARTHROSCOPY     PLACEMENT OF BREAST IMPLANTS  1995   bilat breast implants   TONSILLECTOMY     TUBAL LIGATION     WISDOM TOOTH EXTRACTION      I have reviewed the social history and family history with the patient and they are unchanged from previous note.  ALLERGIES:  is allergic to docetaxel.  MEDICATIONS:  Current Outpatient Medications  Medication Sig Dispense Refill   Ascorbic Acid (VITAMIN C) 1000 MG tablet Take 2,000 mg by mouth daily.      Calcium Carb-Cholecalciferol (CALCIUM 600 + D PO) Take 2 tablets by mouth daily.      cholecalciferol (VITAMIN D3) 25 MCG (1000 UT) tablet Take 1,000 Units by mouth daily.      dexamethasone (DECADRON) 4 MG tablet Take  2 tablets (8 mg total) by mouth 2 (two) times daily. Start the day before Taxotere. Then again the day after chemo for 3 days. 30 tablet 1   Melatonin 10 MG CAPS Take 20 mg by mouth at bedtime as needed (sleep).      methimazole (TAPAZOLE) 10 MG tablet Take 5 mg by mouth See admin instructions. 5 mg daily     Multiple Vitamin (MULTIVITAMIN WITH MINERALS) TABS tablet Take 1 tablet by mouth daily.      Naproxen Sod-diphenhydrAMINE (ALEVE PM) 220-25 MG TABS Take 2 tablets by mouth at bedtime as needed (sleep).     naproxen sodium (ALEVE) 220 MG tablet Take 440 mg by mouth 2 (two) times daily as needed (pain).     ondansetron (ZOFRAN) 8 MG tablet Take 1 tablet (8 mg total) by mouth 2 (two) times daily as needed for refractory nausea / vomiting. Start on day 3 after chemo. 30 tablet 1   prochlorperazine (COMPAZINE) 10 MG tablet Take 1 tablet (10 mg total) by mouth every 6 (six) hours as needed (Nausea or vomiting). 30 tablet 1   sertraline  (ZOLOFT) 100 MG tablet Take 200 mg by mouth daily.      simvastatin (ZOCOR) 40 MG tablet Take 40 mg by mouth every evening.     vitamin E 100 UNIT capsule Take 200 Units by mouth daily.      anastrozole (ARIMIDEX) 1 MG tablet Take 1 tablet (1 mg total) by mouth daily. (Patient not taking: Reported on 03/12/2019) 30 tablet 3   HYDROcodone-acetaminophen (NORCO) 5-325 MG tablet Take 1-2 tablets by mouth every 6 (six) hours as needed for moderate pain or severe pain. (Patient not taking: Reported on 04/02/2019) 30 tablet 0   No current facility-administered medications for this visit.     PHYSICAL EXAMINATION: ECOG PERFORMANCE STATUS: 0 - Asymptomatic  Vitals:   04/02/19 1130  BP: (!) 159/80  Pulse: 63  Resp: 18  Temp: 98 F (36.7 C)  SpO2: 100%   Filed Weights   04/02/19 1130  Weight: 148 lb 4.8 oz (67.3 kg)    GENERAL:alert, no distress and comfortable SKIN: skin color, texture, turgor are normal, no rashes or significant lesions EYES: normal, Conjunctiva are pink and non-injected, sclera clear  NECK: supple, thyroid normal size, non-tender, without nodularity LYMPH:  no palpable lymphadenopathy in the cervical, axillary  LUNGS: clear to auscultation and percussion with normal breathing effort HEART: regular rate & rhythm and no murmurs and no lower extremity edema ABDOMEN:abdomen soft, non-tender and normal bowel sounds Musculoskeletal:no cyanosis of digits and no clubbing  NEURO: alert & oriented x 3 with fluent speech, no focal motor/sensory deficits  LABORATORY DATA:  I have reviewed the data as listed CBC Latest Ref Rng & Units 04/02/2019 03/12/2019 02/26/2019  WBC 4.0 - 10.5 K/uL 9.0 16.3(H) 15.6(H)  Hemoglobin 12.0 - 15.0 g/dL 13.6 12.9 13.4  Hematocrit 36.0 - 46.0 % 41.4 39.1 41.3  Platelets 150 - 400 K/uL 228 467(H) 128(L)     CMP Latest Ref Rng & Units 03/12/2019 02/26/2019 02/19/2019  Glucose 70 - 99 mg/dL 90 103(H) 111(H)  BUN 8 - 23 mg/dL 17 13 18   Creatinine  0.44 - 1.00 mg/dL 0.75 0.73 0.83  Sodium 135 - 145 mmol/L 143 142 144  Potassium 3.5 - 5.1 mmol/L 4.1 4.2 3.9  Chloride 98 - 111 mmol/L 107 109 109  CO2 22 - 32 mmol/L 24 25 24   Calcium 8.9 - 10.3 mg/dL 10.2 8.6(L) 9.3  Total Protein 6.5 - 8.1 g/dL 7.0 6.4(L) 7.3  Total Bilirubin 0.3 - 1.2 mg/dL 0.3 0.4 0.3  Alkaline Phos 38 - 126 U/L 107 136(H) 123  AST 15 - 41 U/L 15 25 15   ALT 0 - 44 U/L 20 14 20       RADIOGRAPHIC STUDIES: I have personally reviewed the radiological images as listed and agreed with the findings in the report. No results found.   ASSESSMENT & PLAN:  [name] is a 68 y.o. female with   1.Malignant neoplasm of upper-inner quadrant of left breast,invasive lobular carcinoma,StageIA,pT1c,N0,M0, ER+/PR+, HER2-, GradeII -She was diagnosed in 10/2018. She started neoadjuvant anastrozole in 4/2020due to the delay of her surgery due to [place] pandemic. This was held after 01/27/19 for further cancer treatment.  -She underwent left breast lumpectomy on 01/07/19. We discussed her pathology report in great detail which shows complete resection, node negative. -Gust her Oncotype DX test results, which showed recurrence score of 33. Her predicted distant recurrence with adjuvant antiestrogen therapy is about 21%.Given her high risk disease, she would benefit from adjuvant chemotherapy followed by radiation and then restarting anti-estrogen with Anastrozole.  -I started her on moderate intensity docetaxel and Cytoxan (TC) every3 weeks for 4 cycles on 02/19/19. She tolerated first cycle very well except had significant fatigue and bone pain from Udenyca injection. She had allergic reaction to Taxol with C2 during infusion. She was able to complete Cytoxan that cycle but not much Taxol. I switched taxol to Abraxane with C3. Based on tolerance may consider adding a 5th cycle of single agent Abraxane.  -She is fine to stop oral sterios with d/c of Taxol.  -Labs reviewed, CBC  WNL. CMP is still pending. If lab adequate, will proceed with cycle 3 Abraxane and cytoxan today.  -f/u in 3 weeks with NP Lacie  -she did not get much Docetaxel from cycle 2, if she tolerating Abraxne well, will add cycle 5 with Abraxane alone.   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 She is s/p rightlumpectomyand SLNB and adjuvant radiation.  -Given her negative ER/PR status, there is no benefit of antiestrogen therapy. Wepreviouslydiscussed chemoprevention for breast cancer in general, and I do not recommend it given her history of ER/PR negative DCIS -Continuesurveillance. No evidence of recurrence.   3. Genetic BRCA plus panel was negative for pathogenetic mutations  4. Bone health  -She has not had DEXA scan in years  -Will repeat later this year as she will be on anastrozole which can weaken her bone.  PLAN: -Labs reviewed and adequate to proceed with cycle 3 Abraxane and Cytoxan  today  -Lab, f/u with NP Lacie and AC in 3 weeks    No problem-specific Assessment & Plan notes found for this encounter.   No orders of the defined types were placed in this encounter.  All questions were answered. The patient knows to call the clinic with any problems, questions or concerns. No barriers to learning was detected. I spent 15 minutes counseling the patient face to face. The total time spent in the appointment was 20 minutes and more than 50% was on counseling and review of test results     [name], MD 04/02/2019   I, Joslyn Devon, am acting as scribe for [name], MD.   I have reviewed the above documentation for accuracy and completeness, and I agree with the above.

## 2019-04-02 ENCOUNTER — Inpatient Hospital Stay (HOSPITAL_BASED_OUTPATIENT_CLINIC_OR_DEPARTMENT_OTHER): Payer: PPO | Admitting: Hematology

## 2019-04-02 ENCOUNTER — Other Ambulatory Visit: Payer: Self-pay

## 2019-04-02 ENCOUNTER — Inpatient Hospital Stay: Payer: PPO

## 2019-04-02 ENCOUNTER — Encounter: Payer: Self-pay | Admitting: Hematology

## 2019-04-02 VITALS — BP 159/80 | HR 63 | Temp 98.0°F | Resp 18 | Ht 60.0 in | Wt 148.3 lb

## 2019-04-02 DIAGNOSIS — Z170 Estrogen receptor positive status [ER+]: Secondary | ICD-10-CM | POA: Diagnosis not present

## 2019-04-02 DIAGNOSIS — C50212 Malignant neoplasm of upper-inner quadrant of left female breast: Secondary | ICD-10-CM

## 2019-04-02 DIAGNOSIS — C50211 Malignant neoplasm of upper-inner quadrant of right female breast: Secondary | ICD-10-CM | POA: Diagnosis not present

## 2019-04-02 DIAGNOSIS — Z171 Estrogen receptor negative status [ER-]: Secondary | ICD-10-CM | POA: Diagnosis not present

## 2019-04-02 DIAGNOSIS — Z5111 Encounter for antineoplastic chemotherapy: Secondary | ICD-10-CM | POA: Diagnosis not present

## 2019-04-02 LAB — CBC WITH DIFFERENTIAL (CANCER CENTER ONLY)
Abs Immature Granulocytes: 0.03 10*3/uL (ref 0.00–0.07)
Basophils Absolute: 0 10*3/uL (ref 0.0–0.1)
Basophils Relative: 0 %
Eosinophils Absolute: 0 10*3/uL (ref 0.0–0.5)
Eosinophils Relative: 0 %
HCT: 41.4 % (ref 36.0–46.0)
Hemoglobin: 13.6 g/dL (ref 12.0–15.0)
Immature Granulocytes: 0 %
Lymphocytes Relative: 26 %
Lymphs Abs: 2.4 10*3/uL (ref 0.7–4.0)
MCH: 29.4 pg (ref 26.0–34.0)
MCHC: 32.9 g/dL (ref 30.0–36.0)
MCV: 89.6 fL (ref 80.0–100.0)
Monocytes Absolute: 0.6 10*3/uL (ref 0.1–1.0)
Monocytes Relative: 7 %
Neutro Abs: 6 10*3/uL (ref 1.7–7.7)
Neutrophils Relative %: 67 %
Platelet Count: 228 10*3/uL (ref 150–400)
RBC: 4.62 MIL/uL (ref 3.87–5.11)
RDW: 13.2 % (ref 11.5–15.5)
WBC Count: 9 10*3/uL (ref 4.0–10.5)
nRBC: 0 % (ref 0.0–0.2)

## 2019-04-02 LAB — CMP (CANCER CENTER ONLY)
ALT: 20 U/L (ref 0–44)
AST: 18 U/L (ref 15–41)
Albumin: 4.2 g/dL (ref 3.5–5.0)
Alkaline Phosphatase: 92 U/L (ref 38–126)
Anion gap: 9 (ref 5–15)
BUN: 16 mg/dL (ref 8–23)
CO2: 26 mmol/L (ref 22–32)
Calcium: 9.7 mg/dL (ref 8.9–10.3)
Chloride: 107 mmol/L (ref 98–111)
Creatinine: 0.85 mg/dL (ref 0.44–1.00)
GFR, Est AFR Am: >60 mL/min (ref >60)
GFR, Estimated: >60 mL/min (ref >60)
Glucose, Bld: 137 mg/dL — ABNORMAL HIGH (ref 70–99)
Potassium: 3.5 mmol/L (ref 3.5–5.1)
Sodium: 142 mmol/L (ref 135–145)
Total Bilirubin: 0.5 mg/dL (ref 0.3–1.2)
Total Protein: 6.9 g/dL (ref 6.5–8.1)

## 2019-04-02 MED ORDER — PACLITAXEL PROTEIN-BOUND CHEMO INJECTION 100 MG: 260.0000 mg/m2 | Freq: Once | INTRAVENOUS | Status: DC

## 2019-04-02 MED ORDER — PACLITAXEL PROTEIN-BOUND CHEMO INJECTION 100 MG
255.0000 mg/m2 | Freq: Once | INTRAVENOUS | Status: AC
  Administered 2019-04-02: 14:00:00 400 mg via INTRAVENOUS
  Filled 2019-04-02: qty 80

## 2019-04-02 MED ORDER — SODIUM CHLORIDE 0.9 % IV SOLN
620.0000 mg/m2 | Freq: Once | INTRAVENOUS | Status: AC
  Administered 2019-04-02: 1000 mg via INTRAVENOUS
  Filled 2019-04-02: qty 50

## 2019-04-02 MED ORDER — PALONOSETRON HCL INJECTION 0.25 MG/5ML
0.2500 mg | Freq: Once | INTRAVENOUS | Status: AC
  Administered 2019-04-02: 0.25 mg via INTRAVENOUS

## 2019-04-02 MED ORDER — DEXAMETHASONE SODIUM PHOSPHATE 10 MG/ML IJ SOLN
10.0000 mg | Freq: Once | INTRAMUSCULAR | Status: AC
  Administered 2019-04-02: 10 mg via INTRAVENOUS

## 2019-04-02 MED ORDER — SODIUM CHLORIDE 0.9 % IV SOLN
Freq: Once | INTRAVENOUS | Status: AC
  Administered 2019-04-02: 12:00:00 via INTRAVENOUS
  Filled 2019-04-02: qty 250

## 2019-04-02 MED ORDER — DEXAMETHASONE SODIUM PHOSPHATE 10 MG/ML IJ SOLN
INTRAMUSCULAR | Status: AC
  Filled 2019-04-02: qty 1

## 2019-04-02 MED ORDER — PALONOSETRON HCL INJECTION 0.25 MG/5ML
INTRAVENOUS | Status: AC
  Filled 2019-04-02: qty 5

## 2019-04-02 NOTE — Patient Instructions (Signed)
[facility] Discharge Instructions for Patients Receiving Chemotherapy  Today you received the following chemotherapy agents Abraxane and Cytoxan  To help prevent nausea and vomiting after your treatment, we encourage you to take your nausea medication as prescribed.   If you develop nausea and vomiting that is not controlled by your nausea medication, call the clinic.   BELOW ARE SYMPTOMS THAT SHOULD BE REPORTED IMMEDIATELY:  *FEVER GREATER THAN 100.5 F  *CHILLS WITH OR WITHOUT FEVER  NAUSEA AND VOMITING THAT IS NOT CONTROLLED WITH YOUR NAUSEA MEDICATION  *UNUSUAL SHORTNESS OF BREATH  *UNUSUAL BRUISING OR BLEEDING  TENDERNESS IN MOUTH AND THROAT WITH OR WITHOUT PRESENCE OF ULCERS  *URINARY PROBLEMS  *BOWEL PROBLEMS  UNUSUAL RASH Items with * indicate a potential emergency and should be followed up as soon as possible.  Feel free to call the clinic should you have any questions or concerns. The clinic phone number is [phone].  Please show the [facility] at check-in to the Emergency Department and triage nurse.  Cyclophosphamide injection What is this medicine? CYCLOPHOSPHAMIDE (sye kloe FOSS fa mide) is a chemotherapy drug. It slows the growth of cancer cells. This medicine is used to treat many types of cancer like lymphoma, myeloma, leukemia, breast cancer, and ovarian cancer, to name a few. This medicine may be used for other purposes; ask your health care provider or pharmacist if you have questions. COMMON BRAND NAME(S): Cytoxan, Neosar What should I tell my health care provider before I take this medicine? They need to know if you have any of these conditions:  blood disorders  history of other chemotherapy  infection  kidney disease  liver disease  recent or ongoing radiation therapy  tumors in the bone marrow  an unusual or allergic reaction to cyclophosphamide, other chemotherapy, other medicines, foods, dyes, or  preservatives  pregnant or trying to get pregnant  breast-feeding How should I use this medicine? This drug is usually given as an injection into a vein or muscle or by infusion into a vein. It is administered in a hospital or clinic by a specially trained health care professional. Talk to your pediatrician regarding the use of this medicine in children. Special care may be needed. Overdosage: If you think you have taken too much of this medicine contact a poison control center or emergency room at once. NOTE: This medicine is only for you. Do not share this medicine with others. What if I miss a dose? It is important not to miss your dose. Call your doctor or health care professional if you are unable to keep an appointment. What may interact with this medicine? This medicine may interact with the following medications:  amiodarone  amphotericin B  azathioprine  certain antiviral medicines for HIV or AIDS such as protease inhibitors (e.g., indinavir, ritonavir) and zidovudine  certain blood pressure medications such as benazepril, captopril, enalapril, fosinopril, lisinopril, moexipril, monopril, perindopril, quinapril, ramipril, trandolapril  certain cancer medications such as anthracyclines (e.g., daunorubicin, doxorubicin), busulfan, cytarabine, paclitaxel, pentostatin, tamoxifen, trastuzumab  certain diuretics such as chlorothiazide, chlorthalidone, hydrochlorothiazide, indapamide, metolazone  certain medicines that treat or prevent blood clots like warfarin  certain muscle relaxants such as succinylcholine  cyclosporine  etanercept  indomethacin  medicines to increase blood counts like filgrastim, pegfilgrastim, sargramostim  medicines used as general anesthesia  metronidazole  natalizumab This list may not describe all possible interactions. Give your health care provider a list of all the medicines, herbs, non-prescription drugs, or dietary supplements  you use.  Also tell them if you smoke, drink alcohol, or use illegal drugs. Some items may interact with your medicine. What should I watch for while using this medicine? Visit your doctor for checks on your progress. This drug may make you feel generally unwell. This is not uncommon, as chemotherapy can affect healthy cells as well as cancer cells. Report any side effects. Continue your course of treatment even though you feel ill unless your doctor tells you to stop. Drink water or other fluids as directed. Urinate often, even at night. In some cases, you may be given additional medicines to help with side effects. Follow all directions for their use. Call your doctor or health care professional for advice if you get a fever, chills or sore throat, or other symptoms of a cold or flu. Do not treat yourself. This drug decreases your body's ability to fight infections. Try to avoid being around people who are sick. This medicine may increase your risk to bruise or bleed. Call your doctor or health care professional if you notice any unusual bleeding. Be careful brushing and flossing your teeth or using a toothpick because you may get an infection or bleed more easily. If you have any dental work done, tell your dentist you are receiving this medicine. You may get drowsy or dizzy. Do not drive, use machinery, or do anything that needs mental alertness until you know how this medicine affects you. Do not become pregnant while taking this medicine or for 1 year after stopping it. Women should inform their doctor if they wish to become pregnant or think they might be pregnant. Men should not father a child while taking this medicine and for 4 months after stopping it. There is a potential for serious side effects to an unborn child. Talk to your health care professional or pharmacist for more information. Do not breast-feed an infant while taking this medicine. This medicine may interfere with the ability to have a  child. This medicine has caused ovarian failure in some women. This medicine has caused reduced sperm counts in some men. You should talk with your doctor or health care professional if you are concerned about your fertility. If you are going to have surgery, tell your doctor or health care professional that you have taken this medicine. What side effects may I notice from receiving this medicine? Side effects that you should report to your doctor or health care professional as soon as possible:  allergic reactions like skin rash, itching or hives, swelling of the face, lips, or tongue  low blood counts - this medicine may decrease the number of white blood cells, red blood cells and platelets. You may be at increased risk for infections and bleeding.  signs of infection - fever or chills, cough, sore throat, pain or difficulty passing urine  signs of decreased platelets or bleeding - bruising, pinpoint red spots on the skin, black, tarry stools, blood in the urine  signs of decreased red blood cells - unusually weak or tired, fainting spells, lightheadedness  breathing problems  dark urine  dizziness  palpitations  swelling of the ankles, feet, hands  trouble passing urine or change in the amount of urine  weight gain  yellowing of the eyes or skin Side effects that usually do not require medical attention (report to your doctor or health care professional if they continue or are bothersome):  changes in nail or skin color  hair loss  missed menstrual periods  mouth  sores  nausea, vomiting This list may not describe all possible side effects. Call your doctor for medical advice about side effects. You may report side effects to FDA at 1-800-FDA-1088. Where should I keep my medicine? This drug is given in a hospital or clinic and will not be stored at home. NOTE: This sheet is a summary. It may not cover all possible information. If you have questions about this medicine,  talk to your doctor, pharmacist, or health care provider.  2020 Elsevier/Gold Standard (2012-06-07 16:22:58) Nanoparticle Albumin-Bound Paclitaxel injection What is this medicine? NANOPARTICLE ALBUMIN-BOUND PACLITAXEL (Na no PAHR ti kuhl al BYOO muhn-bound PAK li TAX el) is a chemotherapy drug. It targets fast dividing cells, like cancer cells, and causes these cells to die. This medicine is used to treat advanced breast cancer, lung cancer, and pancreatic cancer. This medicine may be used for other purposes; ask your health care provider or pharmacist if you have questions. COMMON BRAND NAME(S): Abraxane What should I tell my health care provider before I take this medicine? They need to know if you have any of these conditions:  kidney disease  liver disease  low blood counts, like low white cell, platelet, or red cell counts  lung or breathing disease, like asthma  tingling of the fingers or toes, or other nerve disorder  an unusual or allergic reaction to paclitaxel, albumin, other chemotherapy, other medicines, foods, dyes, or preservatives  pregnant or trying to get pregnant  breast-feeding How should I use this medicine? This drug is given as an infusion into a vein. It is administered in a hospital or clinic by a specially trained health care professional. Talk to your pediatrician regarding the use of this medicine in children. Special care may be needed. Overdosage: If you think you have taken too much of this medicine contact a poison control center or emergency room at once. NOTE: This medicine is only for you. Do not share this medicine with others. What if I miss a dose? It is important not to miss your dose. Call your doctor or health care professional if you are unable to keep an appointment. What may interact with this medicine? This medicine may interact with the following medications:  antiviral medicines for hepatitis, HIV or AIDS  certain antibiotics like  erythromycin and clarithromycin  certain medicines for fungal infections like ketoconazole and itraconazole  certain medicines for seizures like carbamazepine, phenobarbital, phenytoin  gemfibrozil  nefazodone  rifampin  St. John's wort This list may not describe all possible interactions. Give your health care provider a list of all the medicines, herbs, non-prescription drugs, or dietary supplements you use. Also tell them if you smoke, drink alcohol, or use illegal drugs. Some items may interact with your medicine. What should I watch for while using this medicine? Your condition will be monitored carefully while you are receiving this medicine. You will need important blood work done while you are taking this medicine. This medicine can cause serious allergic reactions. If you experience allergic reactions like skin rash, itching or hives, swelling of the face, lips, or tongue, tell your doctor or health care professional right away. In some cases, you may be given additional medicines to help with side effects. Follow all directions for their use. This drug may make you feel generally unwell. This is not uncommon, as chemotherapy can affect healthy cells as well as cancer cells. Report any side effects. Continue your course of treatment even though you feel ill unless your doctor  tells you to stop. Call your doctor or health care professional for advice if you get a fever, chills or sore throat, or other symptoms of a cold or flu. Do not treat yourself. This drug decreases your body's ability to fight infections. Try to avoid being around people who are sick. This medicine may increase your risk to bruise or bleed. Call your doctor or health care professional if you notice any unusual bleeding. Be careful brushing and flossing your teeth or using a toothpick because you may get an infection or bleed more easily. If you have any dental work done, tell your dentist you are receiving this  medicine. Avoid taking products that contain aspirin, acetaminophen, ibuprofen, naproxen, or ketoprofen unless instructed by your doctor. These medicines may hide a fever. Do not become pregnant while taking this medicine or for 6 months after stopping it. Women should inform their doctor if they wish to become pregnant or think they might be pregnant. Men should not father a child while taking this medicine or for 3 months after stopping it. There is a potential for serious side effects to an unborn child. Talk to your health care professional or pharmacist for more information. Do not breast-feed an infant while taking this medicine or for 2 weeks after stopping it. This medicine may interfere with the ability to get pregnant or to father a child. You should talk to your doctor or health care professional if you are concerned about your fertility. What side effects may I notice from receiving this medicine? Side effects that you should report to your doctor or health care professional as soon as possible:  allergic reactions like skin rash, itching or hives, swelling of the face, lips, or tongue  breathing problems  changes in vision  fast, irregular heartbeat  low blood pressure  mouth sores  pain, tingling, numbness in the hands or feet  signs of decreased platelets or bleeding - bruising, pinpoint red spots on the skin, black, tarry stools, blood in the urine  signs of decreased red blood cells - unusually weak or tired, feeling faint or lightheaded, falls  signs of infection - fever or chills, cough, sore throat, pain or difficulty passing urine  signs and symptoms of liver injury like dark yellow or brown urine; general ill feeling or flu-like symptoms; light-colored stools; loss of appetite; nausea; right upper belly pain; unusually weak or tired; yellowing of the eyes or skin  swelling of the ankles, feet, hands  unusually slow heartbeat Side effects that usually do not  require medical attention (report to your doctor or health care professional if they continue or are bothersome):  diarrhea  hair loss  loss of appetite  nausea, vomiting  tiredness This list may not describe all possible side effects. Call your doctor for medical advice about side effects. You may report side effects to FDA at 1-800-FDA-1088. Where should I keep my medicine? This drug is given in a hospital or clinic and will not be stored at home. NOTE: This sheet is a summary. It may not cover all possible information. If you have questions about this medicine, talk to your doctor, pharmacist, or health care provider.  2020 Elsevier/Gold Standard (2017-03-27 13:03:45)  Nanoparticle Albumin-Bound Paclitaxel injection What is this medicine? NANOPARTICLE ALBUMIN-BOUND PACLITAXEL (Na no PAHR ti kuhl al BYOO muhn-bound PAK li TAX el) is a chemotherapy drug. It targets fast dividing cells, like cancer cells, and causes these cells to die. This medicine is used to treat advanced  breast cancer, lung cancer, and pancreatic cancer. This medicine may be used for other purposes; ask your health care provider or pharmacist if you have questions. COMMON BRAND NAME(S): Abraxane What should I tell my health care provider before I take this medicine? They need to know if you have any of these conditions:  kidney disease  liver disease  low blood counts, like low white cell, platelet, or red cell counts  lung or breathing disease, like asthma  tingling of the fingers or toes, or other nerve disorder  an unusual or allergic reaction to paclitaxel, albumin, other chemotherapy, other medicines, foods, dyes, or preservatives  pregnant or trying to get pregnant  breast-feeding How should I use this medicine? This drug is given as an infusion into a vein. It is administered in a hospital or clinic by a specially trained health care professional. Talk to your pediatrician regarding the use of  this medicine in children. Special care may be needed. Overdosage: If you think you have taken too much of this medicine contact a poison control center or emergency room at once. NOTE: This medicine is only for you. Do not share this medicine with others. What if I miss a dose? It is important not to miss your dose. Call your doctor or health care professional if you are unable to keep an appointment. What may interact with this medicine? This medicine may interact with the following medications:  antiviral medicines for hepatitis, HIV or AIDS  certain antibiotics like erythromycin and clarithromycin  certain medicines for fungal infections like ketoconazole and itraconazole  certain medicines for seizures like carbamazepine, phenobarbital, phenytoin  gemfibrozil  nefazodone  rifampin  St. John's wort This list may not describe all possible interactions. Give your health care provider a list of all the medicines, herbs, non-prescription drugs, or dietary supplements you use. Also tell them if you smoke, drink alcohol, or use illegal drugs. Some items may interact with your medicine. What should I watch for while using this medicine? Your condition will be monitored carefully while you are receiving this medicine. You will need important blood work done while you are taking this medicine. This medicine can cause serious allergic reactions. If you experience allergic reactions like skin rash, itching or hives, swelling of the face, lips, or tongue, tell your doctor or health care professional right away. In some cases, you may be given additional medicines to help with side effects. Follow all directions for their use. This drug may make you feel generally unwell. This is not uncommon, as chemotherapy can affect healthy cells as well as cancer cells. Report any side effects. Continue your course of treatment even though you feel ill unless your doctor tells you to stop. Call your doctor  or health care professional for advice if you get a fever, chills or sore throat, or other symptoms of a cold or flu. Do not treat yourself. This drug decreases your body's ability to fight infections. Try to avoid being around people who are sick. This medicine may increase your risk to bruise or bleed. Call your doctor or health care professional if you notice any unusual bleeding. Be careful brushing and flossing your teeth or using a toothpick because you may get an infection or bleed more easily. If you have any dental work done, tell your dentist you are receiving this medicine. Avoid taking products that contain aspirin, acetaminophen, ibuprofen, naproxen, or ketoprofen unless instructed by your doctor. These medicines may hide a fever. Do not become  pregnant while taking this medicine or for 6 months after stopping it. Women should inform their doctor if they wish to become pregnant or think they might be pregnant. Men should not father a child while taking this medicine or for 3 months after stopping it. There is a potential for serious side effects to an unborn child. Talk to your health care professional or pharmacist for more information. Do not breast-feed an infant while taking this medicine or for 2 weeks after stopping it. This medicine may interfere with the ability to get pregnant or to father a child. You should talk to your doctor or health care professional if you are concerned about your fertility. What side effects may I notice from receiving this medicine? Side effects that you should report to your doctor or health care professional as soon as possible:  allergic reactions like skin rash, itching or hives, swelling of the face, lips, or tongue  breathing problems  changes in vision  fast, irregular heartbeat  low blood pressure  mouth sores  pain, tingling, numbness in the hands or feet  signs of decreased platelets or bleeding - bruising, pinpoint red spots on the  skin, black, tarry stools, blood in the urine  signs of decreased red blood cells - unusually weak or tired, feeling faint or lightheaded, falls  signs of infection - fever or chills, cough, sore throat, pain or difficulty passing urine  signs and symptoms of liver injury like dark yellow or brown urine; general ill feeling or flu-like symptoms; light-colored stools; loss of appetite; nausea; right upper belly pain; unusually weak or tired; yellowing of the eyes or skin  swelling of the ankles, feet, hands  unusually slow heartbeat Side effects that usually do not require medical attention (report to your doctor or health care professional if they continue or are bothersome):  diarrhea  hair loss  loss of appetite  nausea, vomiting  tiredness This list may not describe all possible side effects. Call your doctor for medical advice about side effects. You may report side effects to FDA at 1-800-FDA-1088. Where should I keep my medicine? This drug is given in a hospital or clinic and will not be stored at home. NOTE: This sheet is a summary. It may not cover all possible information. If you have questions about this medicine, talk to your doctor, pharmacist, or health care provider.  2020 Elsevier/Gold Standard (2017-03-27 13:03:45)

## 2019-04-03 ENCOUNTER — Telehealth: Payer: Self-pay | Admitting: Hematology

## 2019-04-03 NOTE — Telephone Encounter (Signed)
No los per 8/26. °

## 2019-04-04 ENCOUNTER — Other Ambulatory Visit: Payer: Self-pay

## 2019-04-04 ENCOUNTER — Inpatient Hospital Stay: Payer: PPO

## 2019-04-04 VITALS — BP 138/71 | HR 61 | Temp 98.0°F | Resp 17

## 2019-04-04 DIAGNOSIS — M8589 Other specified disorders of bone density and structure, multiple sites: Secondary | ICD-10-CM | POA: Diagnosis not present

## 2019-04-04 DIAGNOSIS — F339 Major depressive disorder, recurrent, unspecified: Secondary | ICD-10-CM | POA: Diagnosis not present

## 2019-04-04 DIAGNOSIS — Z0000 Encounter for general adult medical examination without abnormal findings: Secondary | ICD-10-CM | POA: Diagnosis not present

## 2019-04-04 DIAGNOSIS — C50212 Malignant neoplasm of upper-inner quadrant of left female breast: Secondary | ICD-10-CM

## 2019-04-04 DIAGNOSIS — Z170 Estrogen receptor positive status [ER+]: Secondary | ICD-10-CM

## 2019-04-04 DIAGNOSIS — C50912 Malignant neoplasm of unspecified site of left female breast: Secondary | ICD-10-CM | POA: Diagnosis not present

## 2019-04-04 DIAGNOSIS — Z5111 Encounter for antineoplastic chemotherapy: Secondary | ICD-10-CM | POA: Diagnosis not present

## 2019-04-04 DIAGNOSIS — E785 Hyperlipidemia, unspecified: Secondary | ICD-10-CM | POA: Diagnosis not present

## 2019-04-04 DIAGNOSIS — M8580 Other specified disorders of bone density and structure, unspecified site: Secondary | ICD-10-CM | POA: Diagnosis not present

## 2019-04-04 DIAGNOSIS — E0590 Thyrotoxicosis, unspecified without thyrotoxic crisis or storm: Secondary | ICD-10-CM | POA: Diagnosis not present

## 2019-04-04 DIAGNOSIS — C50911 Malignant neoplasm of unspecified site of right female breast: Secondary | ICD-10-CM | POA: Diagnosis not present

## 2019-04-04 MED ORDER — PEGFILGRASTIM-CBQV 6 MG/0.6ML ~~LOC~~ SOSY
PREFILLED_SYRINGE | SUBCUTANEOUS | Status: AC
  Filled 2019-04-04: qty 0.6

## 2019-04-04 MED ORDER — PEGFILGRASTIM-CBQV 6 MG/0.6ML ~~LOC~~ SOSY
6.0000 mg | PREFILLED_SYRINGE | Freq: Once | SUBCUTANEOUS | Status: AC
  Administered 2019-04-04: 6 mg via SUBCUTANEOUS

## 2019-04-04 NOTE — Patient Instructions (Signed)

## 2019-04-05 ENCOUNTER — Encounter: Payer: Self-pay | Admitting: Hematology

## 2019-04-07 ENCOUNTER — Other Ambulatory Visit: Payer: Self-pay | Admitting: Nurse Practitioner

## 2019-04-07 ENCOUNTER — Telehealth: Payer: Self-pay

## 2019-04-07 MED ORDER — HYDROCODONE-ACETAMINOPHEN 5-325 MG PO TABS: 1.0000 | ORAL_TABLET | Freq: Four times a day (QID) | ORAL | 0 refills | Status: DC | PRN

## 2019-04-07 NOTE — Telephone Encounter (Signed)
I refilled norco, #10 tabs only. I do not plan to refill. This was initially from her lumpectomy. Please make sure she is taking claritin during injections and 5-7 days after.  Thanks, Regan Rakers

## 2019-04-07 NOTE — Telephone Encounter (Signed)
Patient is requesting a refill on Hydrocodone (last filled 01/07/2019) having achiness from the shot she got on Friday.  [id number]

## 2019-04-07 NOTE — Telephone Encounter (Signed)
Spoke with patient regarding refill on Hydrocodone.  Explained to her [name] NP sent in #10 and does not plan to refill.  She is taking the Claritin during injections.

## 2019-04-10 ENCOUNTER — Other Ambulatory Visit: Payer: Self-pay | Admitting: Hematology

## 2019-04-10 ENCOUNTER — Telehealth: Payer: Self-pay

## 2019-04-10 MED ORDER — PREDNISONE 5 MG PO TABS: 5.0000 mg | ORAL_TABLET | Freq: Every day | ORAL | 0 refills | Status: DC

## 2019-04-10 NOTE — Telephone Encounter (Signed)
Spoke with patient per Dr. Burr Medico she has sent in Prednisone which is a steroid, it is a tapering dose, if she is much better by day 3 she can stop, take Benadryl every 6 hours, avoid driving as it is sedating.  Instructed her to call back if symptoms worsen.  She verbalized an understanding.

## 2019-04-10 NOTE — Telephone Encounter (Signed)
Patient left voice message that she woke up today with itching, face and lips swollen wanted return call.  Called patient back at 1344 and it went to voice message.  I asked that she call back with additional information, where is she itching?  Any shortness of breath? Fever? Difficulty swallowing?     1350 She calls back to say itching all over, no rash, no difficulty swallowing, no shortness of breath, lips swollen, face puffy. Took two Benadryl this morning around 8:00 am she thinks her lips look a little less swollen.  Explained will speak with Dr. Burr Medico and call her back.  [id number]    _______________________________________ Message given to Dr. Burr Medico on review.

## 2019-04-12 ENCOUNTER — Encounter (HOSPITAL_COMMUNITY): Payer: Self-pay | Admitting: *Deleted

## 2019-04-12 ENCOUNTER — Other Ambulatory Visit: Payer: Self-pay

## 2019-04-12 ENCOUNTER — Emergency Department (HOSPITAL_COMMUNITY)
Admission: EM | Admit: 2019-04-12 | Discharge: 2019-04-12 | Disposition: A | Discharge status: Home / Self Care | Payer: PPO | Attending: Emergency Medicine | Admitting: Emergency Medicine

## 2019-04-12 DIAGNOSIS — E039 Hypothyroidism, unspecified: Secondary | ICD-10-CM | POA: Diagnosis not present

## 2019-04-12 DIAGNOSIS — Z853 Personal history of malignant neoplasm of breast: Secondary | ICD-10-CM | POA: Diagnosis not present

## 2019-04-12 DIAGNOSIS — D72829 Elevated white blood cell count, unspecified: Secondary | ICD-10-CM | POA: Diagnosis not present

## 2019-04-12 DIAGNOSIS — Z79899 Other long term (current) drug therapy: Secondary | ICD-10-CM | POA: Diagnosis not present

## 2019-04-12 DIAGNOSIS — Z923 Personal history of irradiation: Secondary | ICD-10-CM | POA: Diagnosis not present

## 2019-04-12 DIAGNOSIS — R21 Rash and other nonspecific skin eruption: Secondary | ICD-10-CM

## 2019-04-12 LAB — BASIC METABOLIC PANEL
Anion gap: 9 (ref 5–15)
BUN: 17 mg/dL (ref 8–23)
CO2: 25 mmol/L (ref 22–32)
Calcium: 9.5 mg/dL (ref 8.9–10.3)
Chloride: 110 mmol/L (ref 98–111)
Creatinine, Ser: 0.68 mg/dL (ref 0.44–1.00)
GFR calc Af Amer: >60 mL/min (ref >60)
GFR calc non Af Amer: >60 mL/min (ref >60)
Glucose, Bld: 119 mg/dL — ABNORMAL HIGH (ref 70–99)
Potassium: 4.5 mmol/L (ref 3.5–5.1)
Sodium: 144 mmol/L (ref 135–145)

## 2019-04-12 LAB — CBC WITH DIFFERENTIAL/PLATELET
Abs Immature Granulocytes: 4.75 10*3/uL — ABNORMAL HIGH (ref 0.00–0.07)
Basophils Absolute: 0 10*3/uL (ref 0.0–0.1)
Basophils Relative: 0 %
Eosinophils Absolute: 0 10*3/uL (ref 0.0–0.5)
Eosinophils Relative: 0 %
HCT: 39.2 % (ref 36.0–46.0)
Hemoglobin: 12.3 g/dL (ref 12.0–15.0)
Immature Granulocytes: 12 %
Lymphocytes Relative: 9 %
Lymphs Abs: 3.5 10*3/uL (ref 0.7–4.0)
MCH: 29.9 pg (ref 26.0–34.0)
MCHC: 31.4 g/dL (ref 30.0–36.0)
MCV: 95.1 fL (ref 80.0–100.0)
Monocytes Absolute: 2 10*3/uL — ABNORMAL HIGH (ref 0.1–1.0)
Monocytes Relative: 5 %
Neutro Abs: 30.1 10*3/uL — ABNORMAL HIGH (ref 1.7–7.7)
Neutrophils Relative %: 74 %
Platelets: 143 10*3/uL — ABNORMAL LOW (ref 150–400)
RBC: 4.12 MIL/uL (ref 3.87–5.11)
RDW: 14.1 % (ref 11.5–15.5)
WBC: 40.3 10*3/uL — ABNORMAL HIGH (ref 4.0–10.5)
nRBC: 0.4 % — ABNORMAL HIGH (ref 0.0–0.2)

## 2019-04-12 MED ORDER — HYDROXYZINE HCL 25 MG PO TABS: 25.0000 mg | ORAL_TABLET | Freq: Four times a day (QID) | ORAL | 0 refills | Status: DC | PRN

## 2019-04-12 MED ORDER — PREDNISONE 5 MG PO TABS: 5.0000 mg | ORAL_TABLET | Freq: Every day | ORAL | 0 refills | Status: DC

## 2019-04-12 MED ORDER — DIPHENHYDRAMINE HCL 50 MG/ML IJ SOLN
25.0000 mg | Freq: Once | INTRAMUSCULAR | Status: AC
  Administered 2019-04-12: 25 mg via INTRAMUSCULAR
  Filled 2019-04-12: qty 1

## 2019-04-12 NOTE — ED Triage Notes (Signed)
Pt complains of itching and redness to her neck, arms, and legs since applying compound w to a raised lesion in her neck. Pt states she has never tried compound w. Pt has chemo last week. Pt called oncologist, who prescribed prednisone a few days ago. Pt states the itching and redness has not improved.

## 2019-04-12 NOTE — ED Provider Notes (Signed)
[facility] DEPT Provider Note   CSN: [place]:[id number] Arrival date & time: 04/12/19  1033     History   Chief Complaint Chief Complaint  Patient presents with  . Rash    HPI [name] is a 68 y.o. female.     HPI Patient presents to the emergency room for evaluation of an itching rash.  Patient noted a small wartlike lesion below the left side of her chin.  She decided to treat it with Compound W on the second.  Patient states the next morning she woke up and she had diffuse itching.  She had a red rash.  She does not have any difficulty breathing.  She called her doctor and was given a prescription for prednisone.  Patient is almost finished with that.  She has been taking Benadryl intermittently.  Patient states she continues to itch.  She denies any difficulty breathing.  No difficulty with fevers or chills. Past Medical History:  Diagnosis Date  . Allergy   . Breast cancer ([name]) 09/11/14   right breast  . 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 Family history of breast cancer   . Family history of colon cancer   . Family history of pancreatic cancer   . Hot flashes   . Hyperlipidemia   . Hypothyroidism   . Personal history of radiation therapy 2016  . Radiation 11/25/14-01/06/15   Right Breast    Patient Active Problem List   Diagnosis Date Noted  . Genetic testing 11/14/2018  . Family history of breast cancer   . Family history of colon cancer   . Family history of pancreatic cancer   . 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 Family history of malignant neoplasm of breast 09/24/2014  . Family history of malignant neoplasm of gastrointestinal tract 09/24/2014  . 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 AUGMENTATION MAMMAPLASTY Bilateral 03/10/1997  . BREAST BIOPSY Right 09/11/2014  . BREAST LUMPECTOMY Right 10/20/2014  . BREAST LUMPECTOMY WITH RADIOACTIVE SEED AND SENTINEL LYMPH NODE BIOPSY N/A 01/07/2019   Procedure: LEFT BREAST LUMPECTOMY WITH RADIOACTIVE SEED AND LEFT DEEP AXILLARY SENTINEL LYMPH NODE BIOPSY WITH BLUE DYE INJECTION;  Surgeon: Fanny Skates, MD;  Location: [facility];  Service: General;  Laterality: N/A;  . COLONOSCOPY    . DILATION AND CURETTAGE OF UTERUS    . KNEE ARTHROSCOPY    . PLACEMENT OF BREAST IMPLANTS  1995   bilat breast implants  . TONSILLECTOMY    . TUBAL LIGATION    . WISDOM TOOTH EXTRACTION       OB History   No obstetric history on file.      Home Medications    Prior to Admission medications   Medication Sig Start Date End Date Taking? Authorizing Provider  Ascorbic Acid (VITAMIN C) 1000 MG tablet Take 2,000 mg by mouth daily.    Yes [provider]  Calcium Carb-Cholecalciferol (CALCIUM 600 + D PO) Take 2 tablets by mouth daily.    Yes [provider]  cholecalciferol (VITAMIN D3) 25 MCG (1000 UT) tablet Take 1,000 Units by mouth daily.    Yes [provider]  HYDROcodone-acetaminophen (NORCO) 5-325 MG tablet Take 1 tablet by mouth every 6 (six) hours as needed  for moderate pain or severe pain. 04/07/19  Yes Alla Feeling, NP  Melatonin 10 MG CAPS Take 20 mg by mouth at bedtime as needed (sleep).    Yes [provider]  methimazole (TAPAZOLE) 5 MG tablet Take 5 mg by mouth daily. 03/19/19  Yes [provider]  Multiple Vitamin (MULTIVITAMIN WITH MINERALS) TABS tablet Take 1 tablet by mouth daily.    Yes [provider]  Naproxen Sod-diphenhydrAMINE (ALEVE PM) 220-25 MG TABS Take 2 tablets by mouth at bedtime as needed (sleep).   Yes [provider]  naproxen sodium (ALEVE) 220 MG tablet Take 440 mg by mouth 2 (two) times daily as needed (pain).   Yes [provider]  ondansetron (ZOFRAN) 8 MG tablet Take 1 tablet (8 mg total) by mouth 2 (two) times daily as needed for refractory nausea / vomiting. Start on day 3 after chemo. 01/27/19  Yes [name], MD  prochlorperazine (COMPAZINE) 10 MG tablet Take 1 tablet (10 mg total) by mouth every 6 (six) hours as needed (Nausea or vomiting). 01/27/19  Yes [name], MD  sertraline (ZOLOFT) 100 MG tablet Take 200 mg by mouth daily.  09/03/13  Yes [provider]  simvastatin (ZOCOR) 40 MG tablet Take 40 mg by mouth every evening.   Yes [provider]  vitamin E 100 UNIT capsule Take 200 Units by mouth daily.    Yes [provider]  anastrozole (ARIMIDEX) 1 MG tablet Take 1 tablet (1 mg total) by mouth daily. Patient not taking: Reported on 03/12/2019 11/07/18   [name], MD  dexamethasone (DECADRON) 4 MG tablet Take 2 tablets (8 mg total) by mouth 2 (two) times daily. Start the day before Taxotere. Then again the day after chemo for 3 days. Patient not taking: Reported on 04/12/2019 01/27/19   [name], MD  hydrOXYzine (ATARAX/VISTARIL) 25 MG tablet Take 1-2 tablets (25-50 mg total) by mouth every 6 (six) hours as needed. 04/12/19   Dorie Rank, MD  predniSONE (DELTASONE) 5 MG tablet Take 1 tablet (5 mg total) by mouth daily with breakfast. Take 8 tabs on day 1, then decrease 1 tab daily until finish 04/12/19   Dorie Rank, MD    Family History Family History  Problem Relation Age of Onset  . Breast cancer Father 24  . Prostate cancer Father 80  . Throat cancer Father 25  . Cancer Father   . Breast cancer Mother        in early 74's  . Pancreatic cancer Maternal Uncle 75  . Cancer Maternal Uncle   . Pancreatic cancer Maternal Grandmother 41  . Cancer Maternal Grandmother   . Cancer Maternal Grandfather 69       leukemia  . Alcohol abuse Brother   . Alcohol abuse Paternal Uncle   . Lung cancer Maternal Uncle   . Colon cancer Maternal Uncle   . Colon polyps Neg Hx   . Esophageal  cancer Neg Hx   . Rectal cancer Neg Hx   . Stomach cancer Neg Hx     Social History Social History   Tobacco Use  . Smoking status: Never Smoker  . Smokeless tobacco: Never Used  Substance Use Topics  . Alcohol use: Yes    Comment: occasional  . Drug use: No     Allergies   Docetaxel   Review of Systems Review of Systems  All other systems reviewed and are negative.    Physical Exam Updated Vital Signs  BP 133/86   Pulse 70   Temp 98.1 F (36.7 C) (Oral)   Resp 18   Ht 1.549 m (5\' 1" )   Wt 68 kg   SpO2 98%   BMI 28.34 kg/m   Physical Exam Vitals signs and nursing note reviewed.  Constitutional:      General: She is not in acute distress.    Appearance: She is well-developed.  HENT:     Head: Normocephalic and atraumatic.     Right Ear: External ear normal.     Left Ear: External ear normal.  Eyes:     General: No scleral icterus.       Right eye: No discharge.        Left eye: No discharge.     Conjunctiva/sclera: Conjunctivae normal.  Neck:     Musculoskeletal: Neck supple.     Trachea: No tracheal deviation.  Cardiovascular:     Rate and Rhythm: Normal rate and regular rhythm.  Pulmonary:     Effort: Pulmonary effort is normal. No respiratory distress.     Breath sounds: Normal breath sounds. No stridor. No wheezing or rales.  Abdominal:     General: Bowel sounds are normal. There is no distension.     Palpations: Abdomen is soft.     Tenderness: There is no abdominal tenderness. There is no guarding or rebound.  Musculoskeletal:        General: No tenderness.  Skin:    General: Skin is warm and dry.     Findings: Rash present.     Comments: Diffuse erythematous rash, questionable few mild urticarial type lesions, no evidence of oral edema, no tongue edema, no petechiae or purpura, no blisterlike lesions  Neurological:     Mental Status: She is alert.     Cranial Nerves: No cranial nerve deficit (no facial droop, extraocular movements  intact, no slurred speech).     Sensory: No sensory deficit.     Motor: No abnormal muscle tone or seizure activity.     Coordination: Coordination normal.      ED Treatments / Results  Labs (all labs ordered are listed, but only abnormal results are displayed) Labs Reviewed  CBC WITH DIFFERENTIAL/PLATELET - Abnormal; Notable for the following components:      Result Value   WBC 40.3 (*)    Platelets 143 (*)    nRBC 0.4 (*)    Neutro Abs 30.1 (*)    Monocytes Absolute 2.0 (*)    Abs Immature Granulocytes 4.75 (*)    All other components within normal limits  BASIC METABOLIC PANEL - Abnormal; Notable for the following components:   Glucose, Bld 119 (*)    All other components within normal limits    EKG None  Radiology No results found.  Procedures Procedures (including critical care time)  Medications Ordered in ED Medications  diphenhydrAMINE (BENADRYL) injection 25 mg (25 mg Intramuscular Given 04/12/19 1517)     Initial Impression / Assessment and Plan / ED Course  I have reviewed the triage vital signs and the nursing notes.  Pertinent labs & imaging results that were available during my care of the patient were reviewed by me and considered in my medical decision making (see chart for details).  Clinical Course as of Apr 12 1723  Sat Apr 12, 2019  1650 Elevated white blood cell count noted however patient was given an injection of Neulasta on the 28th.   [id number] Laboratory tests otherwise unremarkable.   [  JK]    Clinical Course User Index [JK] Dorie Rank, MD     Patient presented to the ED for evaluation of a persistent rash.  Patient did have a recent chemotherapy treatment and was given Neulasta however she has not had any issues in the past.  Patient used Compound W and is not sure if she had some sort of reaction to that medication.  Patient's laboratory tests are notable for an elevated white blood cell count but she was given Neulasta recently and  also is on steroids.  She is not showing any signs of anaphylactic reaction.  She did improve with an injection.  Plan on discharge home with hydroxyzine instead of her Benadryl and a longer course of steroids.  Final Clinical Impressions(s) / ED Diagnoses   Final diagnoses:  Leukocytosis, unspecified type  Rash    ED Discharge Orders         Ordered    hydrOXYzine (ATARAX/VISTARIL) 25 MG tablet  Every 6 hours PRN     04/12/19 1724    predniSONE (DELTASONE) 5 MG tablet  Daily with breakfast     04/12/19 1724           Dorie Rank, MD 04/12/19 1725

## 2019-04-12 NOTE — Discharge Instructions (Addendum)
Take the medications as prescribed.  Follow-up with your oncologist to make sure your symptoms are improving.  Return as needed for difficulty breathing or other concerning symptoms

## 2019-04-22 NOTE — Progress Notes (Signed)
[place]   [phone] Fax:([phone]) [phone]   Clinic Follow up Note   Patient Care Team: [name], DO as PCP - General (Family Medicine) Erroll Luna, MD as Consulting Physician (General Surgery) [name], MD as Consulting Physician (Hematology) Thea Silversmith, MD as Consulting Physician (Radiation Oncology) Rockwell Germany, RN as Registered Nurse Mauro Kaufmann, RN as Registered Nurse Holley Bouche, NP (Inactive) as Nurse Practitioner (Nurse Practitioner) Sylvan Cheese, NP as Nurse Practitioner (Nurse Practitioner) 04/23/2019  CHIEF COMPLAINT: f/u left breast cancer   SUMMARY OF ONCOLOGIC HISTORY: Oncology History Overview Note  Cancer Staging Breast cancer of upper-inner quadrant of right female breast [facility]) Staging form: Breast, AJCC 7th Edition - Clinical stage from 09/23/2014: Stage 0 (Tis (DCIS), N0, M0) - Unsigned - Pathologic stage from 10/22/2014: Stage 0 (Tis (DCIS), N0, cM0) - Signed by [name], MD on 11/03/2014 Staging comments: Staged on final lumpectomy specimen by Dr. [name]  Malignant neoplasm of upper-inner quadrant of left breast in female, estrogen receptor positive ([place]) Staging form: Breast, AJCC 8th Edition - Clinical: cT1b, cN0, cM0, G2, ER+, PR+ - Unsigned    Breast cancer of upper-inner quadrant of right female breast ([place])  09/11/2014 Mammogram   Right breast: area of pleomorphic calcifications and associated architectural distortion, with irregular density at the 1 o'clock position of the right breast. No other suspicious findings in the right breast.    09/11/2014 Breast US   Right breast: area of hypoechogenecity 2 cm x 0.9 cm x 1.6 cm in size, 12 o'clock position, 3 cm the nipple. No discrete suspicious mass.   09/11/2014 Initial Biopsy   Right breast needle core bx (11-12 o'clock, UOQ): DCIS, grade 3, with necrosis and calcifications, ER- (0%), PR- (0%)   09/11/2014 Pathologic Stage   Stage 0:  Tis Nx    09/17/2014 Breast MRI   Non mass enhancement in the upper and slight outer right breast measuring approximately 2.6 x 1.7 x 2.2 cm. Biopsy proven fibrocystic changes in the upper outer periareolar right breast manifested as a 9 x 7 mm mass.   09/24/2014 Procedure   Genetic testing: BRCA plus and BreastNext panels ([place]) performed showing no clinically significant variants detected including ATM, BARD1, BRCA1, BRCA2, BRIP1, CDH1, CHEK2, MRE11A, MUTYH, NBN, NF1, PALB2, PTEN, RAD50, RAD51C, RAD51D, and TP53.   10/20/2014 Definitive Surgery   Right lumpectomy with SLNB ([place]): DCIS with necrosis and calcifications, grade 3, 1 mm from nearest margin (posterior), 0/2 LN with evidence of malignancy.   10/20/2014 Clinical Stage   Stage 0: Tis N0   11/25/2014 - 01/06/2015 Radiation Therapy   Adjuvant RT completed [name]): Right breast 50 Gy over 25 fractions; right breast boost 10 Gy over 5 fractions. Total dose received: 60 Gy.    04/13/2015 Survivorship   Survivorship care plan completed and copy mailed to patient as she was unable to be seen in the Survivorship clinic due to patient's schedule.   11/12/2018 Genetic Testing   Negative genetic testing on the common hereditary cancer panel.  The Hereditary Gene Panel offered by [facility] includes sequencing and/or deletion duplication testing of the following 48 genes: APC, ATM, AXIN2, BARD1, BMPR1A, BRCA1, BRCA2, BRIP1, CDH1, CDK4, CDKN2A (p14ARF), CDKN2A (p16INK4a), CHEK2, CTNNA1, DICER1, EPCAM (Deletion/duplication testing only), GREM1 (promoter region deletion/duplication testing only), KIT, MEN1, MLH1, MSH2, MSH3, MSH6, MUTYH, NBN, NF1, NHTL1, PALB2, PDGFRA, PMS2, POLD1, POLE, PTEN, RAD50, RAD51C, RAD51D, RNF43, SDHB, SDHC, SDHD, SMAD4, SMARCA4. STK11, TP53, TSC1, TSC2, and  VHL.  The following genes were evaluated for sequence changes only: SDHA and HOXB13 c.251G>A variant only. The report date is 11/12/2018.  AXIN2 c.815+fdup ([employer]) VUS  identified.  This is still a normal result and will not change medical management.   Malignant neoplasm of upper-inner quadrant of left breast in female, estrogen receptor positive ([place])  10/18/2018 Initial Diagnosis   Malignant neoplasm of upper-inner quadrant of left breast in female, estrogen receptor positive ([place])   10/18/2018 Initial Biopsy   Diagnosis Breast, left, needle core biopsy, 10 o'clock, 4 cm fn - INVASIVE MAMMARY CARCINOMA - MAMMARY CARCINOMA IN SITU - SEE COMMENT   10/18/2018 Receptors her2   ER: 100% with strong staining  PR: 80% with strong staining  HER2 Negative    10/18/2018 Imaging   Diagnostic Mammogram 10/18/18  IMPRESSION 2 hypoechoic masses in the left breast at 10 o'clock, 4 cm from the nipple. The larger mass measures 5 x 6 x 5 mm. The second mass measures 3 x 5 x 5 mm. The masses are 7 mm apart and span a total dimension 1.7 cm. No axillary adenopathy on the left.   10/18/2018 Cancer Staging   Staging form: Breast, AJCC 8th Edition - Clinical stage from 10/18/2018: Stage IA (cT1b, cN0, cM0, G2, ER+, PR+, HER2-) - Signed by [name], MD on 11/07/2018   10/30/2018 Breast MRI   Breast MRI 10/30/18  IMPRESSION: Biopsy-proven malignancy within the left breast 10 o'clock position. No additional suspicious areas of enhancement identified within either breast.   11/2018 -  Anti-estrogen oral therapy   Anastrozole 40m daily starting 11/2018 (before surgery). Held after 01/27/19 for further cancer treatment.    11/12/2018 Genetic Testing   Negative genetic testing on the common hereditary cancer panel.  The Hereditary Gene Panel offered by [facility] includes sequencing and/or deletion duplication testing of the following 48 genes: APC, ATM, AXIN2, BARD1, BMPR1A, BRCA1, BRCA2, BRIP1, CDH1, CDK4, CDKN2A (p14ARF), CDKN2A (p16INK4a), CHEK2, CTNNA1, DICER1, EPCAM (Deletion/duplication testing only), GREM1 (promoter region deletion/duplication testing only), KIT, MEN1, MLH1,  MSH2, MSH3, MSH6, MUTYH, NBN, NF1, NHTL1, PALB2, PDGFRA, PMS2, POLD1, POLE, PTEN, RAD50, RAD51C, RAD51D, RNF43, SDHB, SDHC, SDHD, SMAD4, SMARCA4. STK11, TP53, TSC1, TSC2, and VHL.  The following genes were evaluated for sequence changes only: SDHA and HOXB13 c.251G>A variant only. The report date is 11/12/2018.  AXIN2 c.815+fdup ([employer]) VUS identified.  This is still a normal result and will not change medical management.   01/07/2019 Surgery   LEFT BREAST LUMPECTOMY WITH RADIOACTIVE SEED AND LEFT DEEP AXILLARY SENTINEL LYMPH NODE BIOPSY WITH BLUE DYE INJECTION by Dr IDalbert Batman6/2/20    01/07/2019 Pathology Results   Diagnosis 01/07/19 1. Breast, lumpectomy, Left w/seed - INVASIVE LOBULAR CARCINOMA, GRADE 2, 1.5 CM. SEE NOTE - CARCINOMA IS 0.4 CM FROM THE SUPERIOR MARGIN AND 0.6 CM FROM THE INFERIOR MARGIN - NO EVIDENCE OF LYMPHOVASCULAR OR PERINEURAL INVASION - BIOPSY SITE CHANGES - SEE ONCOLOGY TABLE 2. Lymph node, sentinel, biopsy, Left axillary #1 - LYMPH NODE, NEGATIVE FOR CARCINOMA (0/1). SEE NOTE 3. Lymph node, sentinel, biopsy, Left axillary #2 - LYMPH NODE, NEGATIVE FOR CARCINOMA (0/1). SEE NOTE 4. Lymph node, sentinel, biopsy, Left - LYMPH NODE, NEGATIVE FOR CARCINOMA (0/1). SEE NOTE 5. Lymph node, sentinel, biopsy, Left - LYMPH NODE, NEGATIVE FOR CARCINOMA (0/1). SEE NOTE 6. Lymph node, sentinel, biopsy, Left axillary #3 - LYMPH NODE, NEGATIVE FOR CARCINOMA (0/1). SEE NOTE   01/07/2019 Cancer Staging   Staging form: Breast, AJCC 8th Edition -  Pathologic stage from 01/07/2019: Stage IA (pT1c, pN0, cM0, G2, ER+, PR+, HER2-, Oncotype DX score: 33) - Signed by [name], MD on 01/27/2019   02/19/2019 -  Adjuvant Chemotherapy   Adjuvant TC every 3 weeks for 4 cycles starting 02/19/19. She had allergic reation to Taxol with C2 during infusion. She was able to complete Cytoxan that cycle but not much Taxol. I switched taxol to Abraxane with C3.      CURRENT THERAPY: -Anastrozole 79m daily  starting 11/2018 (before surgery), held on 01/27/19 for further cancer treatment  -Adjuvant TC every 3 weeks for 4 cycles starting 02/19/19.Due to allergic reaction her Taxotere was changed to Abraxane starting with cycle 3.   INTERVAL HISTORY: Ms. BLoguereturns for f/u and treatment as scheduled. She completed cycle 3 cytoxan and first dose abraxane on 04/02/19. She woke up 9/3 with itching and swollen lips and face. She was given prednisone taper, benadryl, and used compound W to a raised lesion on her neck. On 9/5 she went to ED with questionable reaction to compound W. She was given additional prednisone taper and hydroxyzine. Rash began to resolve next day.  Ms. BMessnerpresents today by herself. She feels well. She did well with treatment otherwise. She does have severe generalized bone pain lasting 6 days after injection. Norco helps her mobility. Claritin does not help much. Her fatigue is at baseline, remains functional. Mild DOE is at baseline. Able to eat well but does not drink that much. Denies mucositis, n/v/c/d, bleeding. Denies neuropathy. She has had 2 minor falls since last visit, once when she tripped over her flip flops and once when a rug slid underneath her. She bruised a toe and did bump her head, no LOC. She recovered well. Denied precipitating dizziness but does have some unrelated dizziness when turning over in bed. None today. Otherwise, denies fever, chills, cough, chest pain, resting dyspnea, leg swelling, or concerns with her breast.    MEDICAL HISTORY:  Past Medical History:  Diagnosis Date  . Allergy   . [place]   right breast  . Breast cancer of [place] quadrant of [place] 09/15[place]2014  . Family history of breast cancer   . Family history of colon cancer   . Family history of pancreatic cancer   . Hot flashes   . Hyperlipidemia   . Hypothyroidism   . Personal history of radiation therapy 2016  . Radiation 11/25/14-01/06/15    Right Breast    SURGICAL HISTORY: Past Surgical History:  Procedure Laterality Date  . AUGMENTATION MAMMAPLASTY Bilateral 03/10/1997  . BREAST BIOPSY Right 09/11/2014  . BREAST LUMPECTOMY Right 10/20/2014  . BREAST LUMPECTOMY WITH RADIOACTIVE SEED AND SENTINEL LYMPH NODE BIOPSY N/A 01/07/2019   Procedure: LEFT BREAST LUMPECTOMY WITH RADIOACTIVE SEED AND LEFT DEEP AXILLARY SENTINEL LYMPH NODE BIOPSY WITH BLUE DYE INJECTION;  Surgeon: IFanny Skates MD;  [facility]  Service: General;  Laterality: N/A;  . COLONOSCOPY    . DILATION AND CURETTAGE OF UTERUS    . KNEE ARTHROSCOPY    . PLACEMENT OF BREAST IMPLANTS  1995   bilat breast implants  . TONSILLECTOMY    . TUBAL LIGATION    . WISDOM TOOTH EXTRACTION      I have reviewed the social history and family history with the patient and they are unchanged from previous note.  ALLERGIES:  is allergic to docetaxel.  MEDICATIONS:  Current Outpatient Medications  Medication Sig Dispense Refill  . anastrozole (  ARIMIDEX) 1 MG tablet Take 1 tablet (1 mg total) by mouth daily. 30 tablet 3  . Ascorbic Acid (VITAMIN C) 1000 MG tablet Take 2,000 mg by mouth daily.     . Calcium Carb-Cholecalciferol (CALCIUM 600 + D PO) Take 2 tablets by mouth daily.     . cholecalciferol (VITAMIN D3) 25 MCG (1000 UT) tablet Take 1,000 Units by mouth daily.     Marland Kitchen HYDROcodone-acetaminophen (NORCO) 5-325 MG tablet Take 1 tablet by mouth every 6 (six) hours as needed for moderate pain or severe pain. 10 tablet 0  . Melatonin 10 MG CAPS Take 20 mg by mouth at bedtime as needed (sleep).     . methimazole (TAPAZOLE) 5 MG tablet Take 5 mg by mouth daily.    . Multiple Vitamin (MULTIVITAMIN WITH MINERALS) TABS tablet Take 1 tablet by mouth daily.     . Naproxen Sod-diphenhydrAMINE (ALEVE PM) 220-25 MG TABS Take 2 tablets by mouth at bedtime as needed (sleep).    . naproxen sodium (ALEVE) 220 MG tablet Take 440 mg by mouth 2 (two) times daily as needed (pain).    .  ondansetron (ZOFRAN) 8 MG tablet Take 1 tablet (8 mg total) by mouth 2 (two) times daily as needed for refractory nausea / vomiting. Start on day 3 after chemo. 30 tablet 1  . prochlorperazine (COMPAZINE) 10 MG tablet Take 1 tablet (10 mg total) by mouth every 6 (six) hours as needed (Nausea or vomiting). 30 tablet 1  . sertraline (ZOLOFT) 100 MG tablet Take 200 mg by mouth daily.     . simvastatin (ZOCOR) 40 MG tablet Take 40 mg by mouth every evening.    . vitamin E 100 UNIT capsule Take 200 Units by mouth daily.     Marland Kitchen dexamethasone (DECADRON) 4 MG tablet Take 2 tablets (8 mg total) by mouth 2 (two) times daily. Start the day before Taxotere. Then again the day after chemo for 3 days. 30 tablet 1  . hydrOXYzine (ATARAX/VISTARIL) 25 MG tablet Take 1-2 tablets (25-50 mg total) by mouth every 6 (six) hours as needed. 30 tablet 0  . predniSONE (DELTASONE) 5 MG tablet Take 1 tablet (5 mg total) by mouth daily with breakfast. Take 8 tabs on day 1, then decrease 1 tab daily until finish 36 tablet 0   No current facility-administered medications for this visit.     PHYSICAL EXAMINATION: ECOG PERFORMANCE STATUS: 1 - Symptomatic but completely ambulatory  Vitals:   04/23/19 0907 04/23/19 0908  BP: 124/74 125/70  Pulse: 67 76  Resp: 18 18  Temp:    SpO2: 98% 97%   Filed Weights   04/23/19 0858 04/23/19 0906  Weight: 198 lb 12.8 oz (90.2 kg) 148 lb 12.8 oz (67.5 kg)    GENERAL:alert, no distress and comfortable SKIN: no obvious rash or significant lesions  EYES: sclera clear OROPHARYNX: no thrush or ulcers LUNGS: clear with normal breathing effort HEART: regular rate & rhythm, no lower extremity edema ABDOMEN: abdomen soft, non-tender and normal bowel sounds Musculoskeletal:no cyanosis of digits  NEURO: alert & oriented x 3 with fluent speech, normal gait. Breast exam deferred   LABORATORY DATA:  I have reviewed the data as listed CBC Latest Ref Rng & Units 04/23/2019 04/12/2019 04/02/2019   WBC 4.0 - 10.5 K/uL 7.9 40.3(H) 9.0  Hemoglobin 12.0 - 15.0 g/dL 13.2 12.3 13.6  Hematocrit 36.0 - 46.0 % 40.7 39.2 41.4  Platelets 150 - 400 K/uL 362 143(L) 228  CMP Latest Ref Rng & Units 04/23/2019 04/12/2019 04/02/2019  Glucose 70 - 99 mg/dL 125(H) 119(H) 137(H)  BUN 8 - 23 mg/dL 17 17 16   Creatinine 0.44 - 1.00 mg/dL 0.77 0.68 0.85  Sodium 135 - 145 mmol/L 144 144 142  Potassium 3.5 - 5.1 mmol/L 3.8 4.5 3.5  Chloride 98 - 111 mmol/L 108 110 107  CO2 22 - 32 mmol/L 28 25 26   Calcium 8.9 - 10.3 mg/dL 9.6 9.5 9.7  Total Protein 6.5 - 8.1 g/dL 6.5 - 6.9  Total Bilirubin 0.3 - 1.2 mg/dL 0.3 - 0.5  Alkaline Phos 38 - 126 U/L 103 - 92  AST 15 - 41 U/L 15 - 18  ALT 0 - 44 U/L 22 - 20      RADIOGRAPHIC STUDIES: I have personally reviewed the radiological images as listed and agreed with the findings in the report. No results found.   ASSESSMENT & PLAN: Jocelyn WEB a 68 y.o.femalewith   1.Malignant neoplasm of upper-inner quadrant of left breast,invasive lobular carcinoma,StageIA,pT1c,N0,M0, ER+/PR+, HER2-, GradeII -She was diagnosed in 10/2018. She started neoadjuvant anastrozole in 4/2020due to the delay of her surgery due to [place] pandemic.  -She underwent left breast lumpectomy on 01/07/19. Pathoalogy report shows complete resection, node negative. -Oncotype DX showed recurrence score of 33.Dr. Burr Medico previously reviewedher predicted distant recurrence with adjuvant antiestrogen therapy is about 21%.Given her high riskbutearly stage disease, node-negative, lobular histology,Dr. Fengrecommendedmoderate intensity docetaxel and Cytoxan (TC) every3 weeks for 4 cycles.Shedid not recommend staging work-up -holding anastrozole during chemo -S/p 3 cycles of chemo; she began abraxane with cycle 3 due to taxotere reaction. She developed skin rash question related to chemo vs topical compound W, otherwise tolerated well. 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 She is s/p rightlumpectomyand SLNB and adjuvant radiation.  -surveillance  3. Genetics -The BRCA plus panel, which includes a BRCA1, BRCA2, CDH1, PALB2, PTEN, TP53 were all negative. -Repeat genetics negative for pathogenetic mutations.This was previously reviewed  4. Bone health  -She has not had DEXA scan in years, will repeat later this year as AI can decrease BMD  5. Skin rash -She received Abraxane with cycle 3 on 04/02/19 due to previous taxotere reaction.  -On 04/10/19 she reported itching skin rash and facial swelling at home after using topical compound W on a raised neck lesion.  -She required ED visit and prednisone taper. Rash resolved quickly.   -Unclear whether the rash is related to Abraxane vs topical agent, will proceed with caution with chemo and add premeds and post-treatment steroids  6. Fall -On 9/4 after tripping over her flip flops, and on 9/14 after a rug slid underneath her -no LOC, no significant injury; no precipitating dizziness -BP normal today, not orthostatic  -encouraged po hydration, avoiding fall, sturdy shoes, remove lose rugs, etc. She understands   Disposition:  [name] appears stable. She completed 3 cycles of adjuvant chemo. She tolerates moderately well except significant GCSF-related bone pain. I will refill Norco for her one last time, will not receive additional refills. She recovers well. Labs reviewed, CBC and CMP stable, adequate for treatment.   She developed a skin rash and facial swelling few days after Abraxane and shortly after using topical Compound W for a raised lesion on her neck. She reported to ED and received prednisone taper with hydroxyzine. Rash resolved quickly. Unclear if this represents true Abraxane  allergy. Will add premeds to chemo today. She will take decadron 4 mg daily x3-5 days starting on 04/24/19. She knows to call if she  develops recurrent rash or hives.   She will proceed with cycle 4 Cytoxan and Abraxane today with Udenyca on day 3. Given her overall good tolerance to chemo, will proceed with additional cycle 5 single agent Abraxane in 3 weeks. She will f/u with [place] before treatment. Plan to refer to rad onc at next visit. I reviewed the plan with Dr. Burr Medico.   She had 2 minor falls recently. She denies precipitating dizziness but does have some dizzy spells when turning over in bed. Question vertigo. She does not have orthostatic hypotension today. I encouraged her to increase po hydration. She knows to wear sturdy shoes. She has removed lose rugs and uses caution at home. I encouraged her to notify us if she has recurrent fall.    All questions were answered. The patient knows to call the clinic with any problems, questions or concerns. No barriers to learning was detected. I spent 20 minutes counseling the patient face to face. The total time spent in the appointment was 25 minutes and more than 50% was on counseling and review of test results     Alla Feeling, NP 04/23/19

## 2019-04-23 ENCOUNTER — Inpatient Hospital Stay: Payer: PPO

## 2019-04-23 ENCOUNTER — Encounter: Payer: Self-pay | Admitting: Nurse Practitioner

## 2019-04-23 ENCOUNTER — Other Ambulatory Visit: Payer: Self-pay

## 2019-04-23 ENCOUNTER — Inpatient Hospital Stay: Payer: PPO | Attending: Hematology | Admitting: Nurse Practitioner

## 2019-04-23 VITALS — BP 125/70 | HR 76 | Temp 98.0°F | Resp 18 | Ht 61.0 in | Wt 148.8 lb

## 2019-04-23 DIAGNOSIS — Z5189 Encounter for other specified aftercare: Secondary | ICD-10-CM | POA: Insufficient documentation

## 2019-04-23 DIAGNOSIS — Z170 Estrogen receptor positive status [ER+]: Secondary | ICD-10-CM

## 2019-04-23 DIAGNOSIS — C50212 Malignant neoplasm of upper-inner quadrant of left female breast: Secondary | ICD-10-CM

## 2019-04-23 DIAGNOSIS — Z5111 Encounter for antineoplastic chemotherapy: Secondary | ICD-10-CM | POA: Insufficient documentation

## 2019-04-23 LAB — CBC WITH DIFFERENTIAL (CANCER CENTER ONLY)
Abs Immature Granulocytes: 0.05 10*3/uL (ref 0.00–0.07)
Basophils Absolute: 0.1 10*3/uL (ref 0.0–0.1)
Basophils Relative: 1 %
Eosinophils Absolute: 0 10*3/uL (ref 0.0–0.5)
Eosinophils Relative: 0 %
HCT: 40.7 % (ref 36.0–46.0)
Hemoglobin: 13.2 g/dL (ref 12.0–15.0)
Immature Granulocytes: 1 %
Lymphocytes Relative: 26 %
Lymphs Abs: 2 10*3/uL (ref 0.7–4.0)
MCH: 29.3 pg (ref 26.0–34.0)
MCHC: 32.4 g/dL (ref 30.0–36.0)
MCV: 90.4 fL (ref 80.0–100.0)
Monocytes Absolute: 0.9 10*3/uL (ref 0.1–1.0)
Monocytes Relative: 11 %
Neutro Abs: 4.9 10*3/uL (ref 1.7–7.7)
Neutrophils Relative %: 61 %
Platelet Count: 362 10*3/uL (ref 150–400)
RBC: 4.5 MIL/uL (ref 3.87–5.11)
RDW: 14.4 % (ref 11.5–15.5)
WBC Count: 7.9 10*3/uL (ref 4.0–10.5)
nRBC: 0 % (ref 0.0–0.2)

## 2019-04-23 LAB — CMP (CANCER CENTER ONLY)
ALT: 22 U/L (ref 0–44)
AST: 15 U/L (ref 15–41)
Albumin: 4 g/dL (ref 3.5–5.0)
Alkaline Phosphatase: 103 U/L (ref 38–126)
Anion gap: 8 (ref 5–15)
BUN: 17 mg/dL (ref 8–23)
CO2: 28 mmol/L (ref 22–32)
Calcium: 9.6 mg/dL (ref 8.9–10.3)
Chloride: 108 mmol/L (ref 98–111)
Creatinine: 0.77 mg/dL (ref 0.44–1.00)
GFR, Est AFR Am: >60 mL/min (ref >60)
GFR, Estimated: >60 mL/min (ref >60)
Glucose, Bld: 125 mg/dL — ABNORMAL HIGH (ref 70–99)
Potassium: 3.8 mmol/L (ref 3.5–5.1)
Sodium: 144 mmol/L (ref 135–145)
Total Bilirubin: 0.3 mg/dL (ref 0.3–1.2)
Total Protein: 6.5 g/dL (ref 6.5–8.1)

## 2019-04-23 MED ORDER — PALONOSETRON HCL INJECTION 0.25 MG/5ML
INTRAVENOUS | Status: AC
  Filled 2019-04-23: qty 5

## 2019-04-23 MED ORDER — FAMOTIDINE IN NACL 20-0.9 MG/50ML-% IV SOLN
INTRAVENOUS | Status: AC
  Filled 2019-04-23: qty 50

## 2019-04-23 MED ORDER — DEXAMETHASONE SODIUM PHOSPHATE 10 MG/ML IJ SOLN
INTRAMUSCULAR | Status: AC
  Filled 2019-04-23: qty 1

## 2019-04-23 MED ORDER — HEPARIN SOD (PORK) LOCK FLUSH 100 UNIT/ML IV SOLN
500.0000 [IU] | Freq: Once | INTRAVENOUS | Status: DC | PRN
  Filled 2019-04-23: qty 5

## 2019-04-23 MED ORDER — SODIUM CHLORIDE 0.9% FLUSH
10.0000 mL | INTRAVENOUS | Status: DC | PRN
  Filled 2019-04-23: qty 10

## 2019-04-23 MED ORDER — DEXAMETHASONE SODIUM PHOSPHATE 10 MG/ML IJ SOLN
10.0000 mg | Freq: Once | INTRAMUSCULAR | Status: AC
  Administered 2019-04-23: 10 mg via INTRAVENOUS

## 2019-04-23 MED ORDER — SODIUM CHLORIDE 0.9 % IV SOLN
1000.0000 mg | Freq: Once | INTRAVENOUS | Status: AC
  Administered 2019-04-23: 1000 mg via INTRAVENOUS
  Filled 2019-04-23: qty 50

## 2019-04-23 MED ORDER — DIPHENHYDRAMINE HCL 50 MG/ML IJ SOLN
INTRAMUSCULAR | Status: AC
  Filled 2019-04-23: qty 1

## 2019-04-23 MED ORDER — DIPHENHYDRAMINE HCL 50 MG/ML IJ SOLN
25.0000 mg | Freq: Once | INTRAMUSCULAR | Status: AC
  Administered 2019-04-23: 25 mg via INTRAVENOUS

## 2019-04-23 MED ORDER — SODIUM CHLORIDE 0.9 % IV SOLN
Freq: Once | INTRAVENOUS | Status: AC
  Administered 2019-04-23: 10:00:00 via INTRAVENOUS
  Filled 2019-04-23: qty 250

## 2019-04-23 MED ORDER — PACLITAXEL PROTEIN-BOUND CHEMO INJECTION 100 MG
400.0000 mg | Freq: Once | INTRAVENOUS | Status: AC
  Administered 2019-04-23: 400 mg via INTRAVENOUS
  Filled 2019-04-23: qty 80

## 2019-04-23 MED ORDER — PALONOSETRON HCL INJECTION 0.25 MG/5ML
0.2500 mg | Freq: Once | INTRAVENOUS | Status: AC
  Administered 2019-04-23: 0.25 mg via INTRAVENOUS

## 2019-04-23 MED ORDER — FAMOTIDINE IN NACL 20-0.9 MG/50ML-% IV SOLN
20.0000 mg | Freq: Once | INTRAVENOUS | Status: AC
  Administered 2019-04-23: 20 mg via INTRAVENOUS

## 2019-04-23 NOTE — Patient Instructions (Signed)
[place] Discharge Instructions for Patients Receiving Chemotherapy  Today you received the following chemotherapy agents Abraxane and Cytoxan  To help prevent nausea and vomiting after your treatment, we encourage you to take your nausea medication as prescribed.   If you develop nausea and vomiting that is not controlled by your nausea medication, call the clinic.   BELOW ARE SYMPTOMS THAT SHOULD BE REPORTED IMMEDIATELY:  *FEVER GREATER THAN 100.5 F  *CHILLS WITH OR WITHOUT FEVER  NAUSEA AND VOMITING THAT IS NOT CONTROLLED WITH YOUR NAUSEA MEDICATION  *UNUSUAL SHORTNESS OF BREATH  *UNUSUAL BRUISING OR BLEEDING  TENDERNESS IN MOUTH AND THROAT WITH OR WITHOUT PRESENCE OF ULCERS  *URINARY PROBLEMS  *BOWEL PROBLEMS  UNUSUAL RASH Items with * indicate a potential emergency and should be followed up as soon as possible.  Feel free to call the clinic should you have any questions or concerns. The clinic phone number is [phone].  Please show the [place] at check-in to the Emergency Department and triage nurse.

## 2019-04-24 ENCOUNTER — Telehealth: Payer: Self-pay | Admitting: Nurse Practitioner

## 2019-04-24 NOTE — Telephone Encounter (Signed)
Scheduled appt per 9/16 los.  Spoke with patient and she is aware of the appt date and time,

## 2019-04-25 ENCOUNTER — Other Ambulatory Visit: Payer: Self-pay

## 2019-04-25 ENCOUNTER — Other Ambulatory Visit: Payer: Self-pay | Admitting: Nurse Practitioner

## 2019-04-25 ENCOUNTER — Inpatient Hospital Stay: Payer: PPO

## 2019-04-25 VITALS — BP 124/72 | HR 66 | Temp 97.8°F | Resp 18

## 2019-04-25 DIAGNOSIS — C50212 Malignant neoplasm of upper-inner quadrant of left female breast: Secondary | ICD-10-CM

## 2019-04-25 DIAGNOSIS — Z5111 Encounter for antineoplastic chemotherapy: Secondary | ICD-10-CM | POA: Diagnosis not present

## 2019-04-25 DIAGNOSIS — Z170 Estrogen receptor positive status [ER+]: Secondary | ICD-10-CM

## 2019-04-25 MED ORDER — PEGFILGRASTIM-CBQV 6 MG/0.6ML ~~LOC~~ SOSY
PREFILLED_SYRINGE | SUBCUTANEOUS | Status: AC
  Filled 2019-04-25: qty 0.6

## 2019-04-25 MED ORDER — PEGFILGRASTIM-CBQV 6 MG/0.6ML ~~LOC~~ SOSY
6.0000 mg | PREFILLED_SYRINGE | Freq: Once | SUBCUTANEOUS | Status: AC
  Administered 2019-04-25: 12:00:00 6 mg via SUBCUTANEOUS

## 2019-04-25 NOTE — Patient Instructions (Signed)

## 2019-04-28 ENCOUNTER — Other Ambulatory Visit: Payer: Self-pay | Admitting: Nurse Practitioner

## 2019-04-28 MED ORDER — HYDROCODONE-ACETAMINOPHEN 5-325 MG PO TABS: 1.0000 | ORAL_TABLET | Freq: Four times a day (QID) | ORAL | 0 refills | Status: DC | PRN

## 2019-04-28 NOTE — Telephone Encounter (Signed)
Last filled on 04/07/19 for # 10. ? Bone pain. Not sure reason for pain med

## 2019-05-09 NOTE — Progress Notes (Signed)
[place]   [phone] [phone]   Clinic Follow up Note   Patient Care Team: [name], DO as PCP - General (Family Medicine) Erroll Luna, MD as Consulting Physician (General Surgery) [name], MD as Consulting Physician (Hematology) Thea Silversmith, MD as Consulting Physician (Radiation Oncology) Rockwell Germany, RN as Registered Nurse Mauro Kaufmann, RN as Registered Nurse Holley Bouche, NP (Inactive) as Nurse Practitioner (Nurse Practitioner) Sylvan Cheese, NP as Nurse Practitioner (Nurse Practitioner)  Date of Service:  05/14/2019  CHIEF COMPLAINT:   SUMMARY OF ONCOLOGIC HISTORY: Oncology History Overview Note  Cancer Staging Breast cancer of upper-inner quadrant of right female breast [facility]) Staging form: Breast, AJCC 7th Edition - Clinical stage from 09/23/2014: Stage 0 (Tis (DCIS), N0, M0) - Unsigned - Pathologic stage from 10/22/2014: Stage 0 (Tis (DCIS), N0, cM0) - Signed by [name], MD on 11/03/2014 Staging comments: Staged on final lumpectomy specimen by Dr. [name]  Malignant neoplasm of upper-inner quadrant of left breast in female, estrogen receptor positive ([place]) Staging form: Breast, AJCC 8th Edition - Clinical: cT1b, cN0, cM0, G2, ER+, PR+ - Unsigned    Breast cancer of upper-inner quadrant of right female breast ([place])  09/11/2014 Mammogram   Right breast: area of pleomorphic calcifications and associated architectural distortion, with irregular density at the 1 o'clock position of the right breast. No other suspicious findings in the right breast.    09/11/2014 Breast US   Right breast: area of hypoechogenecity 2 cm x 0.9 cm x 1.6 cm in size, 12 o'clock position, 3 cm the nipple. No discrete suspicious mass.   09/11/2014 Initial Biopsy   Right breast needle core bx (11-12 o'clock, UOQ): DCIS, grade 3, with necrosis and calcifications, ER- (0%), PR- (0%)   09/11/2014 Pathologic Stage   Stage 0: Tis  Nx    09/17/2014 Breast MRI   Non mass enhancement in the upper and slight outer right breast measuring approximately 2.6 x 1.7 x 2.2 cm. Biopsy proven fibrocystic changes in the upper outer periareolar right breast manifested as a 9 x 7 mm mass.   09/24/2014 Procedure   Genetic testing: BRCA plus and BreastNext panels ([place]) performed showing no clinically significant variants detected including ATM, BARD1, BRCA1, BRCA2, BRIP1, CDH1, CHEK2, MRE11A, MUTYH, NBN, NF1, PALB2, PTEN, RAD50, RAD51C, RAD51D, and TP53.   10/20/2014 Definitive Surgery   Right lumpectomy with SLNB ([place]): DCIS with necrosis and calcifications, grade 3, 1 mm from nearest margin (posterior), 0/2 LN with evidence of malignancy.   10/20/2014 Clinical Stage   Stage 0: Tis N0   11/25/2014 - 01/06/2015 Radiation Therapy   Adjuvant RT completed [name]): Right breast 50 Gy over 25 fractions; right breast boost 10 Gy over 5 fractions. Total dose received: 60 Gy.    04/13/2015 Survivorship   Survivorship care plan completed and copy mailed to patient as she was unable to be seen in the Survivorship clinic due to patient's schedule.   11/12/2018 Genetic Testing   Negative genetic testing on the common hereditary cancer panel.  The Hereditary Gene Panel offered by [facility] includes sequencing and/or deletion duplication testing of the following 48 genes: APC, ATM, AXIN2, BARD1, BMPR1A, BRCA1, BRCA2, BRIP1, CDH1, CDK4, CDKN2A (p14ARF), CDKN2A (p16INK4a), CHEK2, CTNNA1, DICER1, EPCAM (Deletion/duplication testing only), GREM1 (promoter region deletion/duplication testing only), KIT, MEN1, MLH1, MSH2, MSH3, MSH6, MUTYH, NBN, NF1, NHTL1, PALB2, PDGFRA, PMS2, POLD1, POLE, PTEN, RAD50, RAD51C, RAD51D, RNF43, SDHB, SDHC, SDHD, SMAD4, SMARCA4. STK11, TP53, TSC1, TSC2,  and VHL.  The following genes were evaluated for sequence changes only: SDHA and HOXB13 c.251G>A variant only. The report date is 11/12/2018.  AXIN2 c.815+fdup ([employer]) VUS  identified.  This is still a normal result and will not change medical management.   Malignant neoplasm of upper-inner quadrant of left breast in female, estrogen receptor positive ([place])  10/18/2018 Initial Diagnosis   Malignant neoplasm of upper-inner quadrant of left breast in female, estrogen receptor positive ([place])   10/18/2018 Initial Biopsy   Diagnosis Breast, left, needle core biopsy, 10 o'clock, 4 cm fn - INVASIVE MAMMARY CARCINOMA - MAMMARY CARCINOMA IN SITU - SEE COMMENT   10/18/2018 Receptors her2   ER: 100% with strong staining  PR: 80% with strong staining  HER2 Negative    10/18/2018 Imaging   Diagnostic Mammogram 10/18/18  IMPRESSION 2 hypoechoic masses in the left breast at 10 o'clock, 4 cm from the nipple. The larger mass measures 5 x 6 x 5 mm. The second mass measures 3 x 5 x 5 mm. The masses are 7 mm apart and span a total dimension 1.7 cm. No axillary adenopathy on the left.   10/18/2018 Cancer Staging   Staging form: Breast, AJCC 8th Edition - Clinical stage from 10/18/2018: Stage IA (cT1b, cN0, cM0, G2, ER+, PR+, HER2-) - Signed by [name], MD on 11/07/2018   10/30/2018 Breast MRI   Breast MRI 10/30/18  IMPRESSION: Biopsy-proven malignancy within the left breast 10 o'clock position. No additional suspicious areas of enhancement identified within either breast.   11/2018 -  Anti-estrogen oral therapy   Anastrozole 56m daily starting 11/2018 (before surgery). Held after 01/27/19 for further cancer treatment.    11/12/2018 Genetic Testing   Negative genetic testing on the common hereditary cancer panel.  The Hereditary Gene Panel offered by [facility] includes sequencing and/or deletion duplication testing of the following 48 genes: APC, ATM, AXIN2, BARD1, BMPR1A, BRCA1, BRCA2, BRIP1, CDH1, CDK4, CDKN2A (p14ARF), CDKN2A (p16INK4a), CHEK2, CTNNA1, DICER1, EPCAM (Deletion/duplication testing only), GREM1 (promoter region deletion/duplication testing only), KIT, MEN1, MLH1,  MSH2, MSH3, MSH6, MUTYH, NBN, NF1, NHTL1, PALB2, PDGFRA, PMS2, POLD1, POLE, PTEN, RAD50, RAD51C, RAD51D, RNF43, SDHB, SDHC, SDHD, SMAD4, SMARCA4. STK11, TP53, TSC1, TSC2, and VHL.  The following genes were evaluated for sequence changes only: SDHA and HOXB13 c.251G>A variant only. The report date is 11/12/2018.  AXIN2 c.815+fdup ([employer]) VUS identified.  This is still a normal result and will not change medical management.   01/07/2019 Surgery   LEFT BREAST LUMPECTOMY WITH RADIOACTIVE SEED AND LEFT DEEP AXILLARY SENTINEL LYMPH NODE BIOPSY WITH BLUE DYE INJECTION by Dr IDalbert Batman6/2/20    01/07/2019 Pathology Results   Diagnosis 01/07/19 1. Breast, lumpectomy, Left w/seed - INVASIVE LOBULAR CARCINOMA, GRADE 2, 1.5 CM. SEE NOTE - CARCINOMA IS 0.4 CM FROM THE SUPERIOR MARGIN AND 0.6 CM FROM THE INFERIOR MARGIN - NO EVIDENCE OF LYMPHOVASCULAR OR PERINEURAL INVASION - BIOPSY SITE CHANGES - SEE ONCOLOGY TABLE 2. Lymph node, sentinel, biopsy, Left axillary #1 - LYMPH NODE, NEGATIVE FOR CARCINOMA (0/1). SEE NOTE 3. Lymph node, sentinel, biopsy, Left axillary #2 - LYMPH NODE, NEGATIVE FOR CARCINOMA (0/1). SEE NOTE 4. Lymph node, sentinel, biopsy, Left - LYMPH NODE, NEGATIVE FOR CARCINOMA (0/1). SEE NOTE 5. Lymph node, sentinel, biopsy, Left - LYMPH NODE, NEGATIVE FOR CARCINOMA (0/1). SEE NOTE 6. Lymph node, sentinel, biopsy, Left axillary #3 - LYMPH NODE, NEGATIVE FOR CARCINOMA (0/1). SEE NOTE   01/07/2019 Cancer Staging   Staging form: Breast, AJCC 8th Edition -  Pathologic stage from 01/07/2019: Stage IA (pT1c, pN0, cM0, G2, ER+, PR+, HER2-, Oncotype DX score: 33) - Signed by [name], MD on 01/27/2019   02/19/2019 - 05/14/2019 Adjuvant Chemotherapy   Adjuvant TC every 3 weeks for 4 cycles starting 02/19/19. She had allergic reation to Taxol with C2 during infusion. She was able to complete Cytoxan that cycle but not much Taxol. I switched taxol to Abraxane with C3. Completed chemo on 05/14/19.        CURRENT THERAPY:  -Anastrozole 5m daily starting 11/2018 (before surgery), held on 01/27/19 for further cancer treatment  -Adjuvant TC every 3 weeks for 4 cycles starting 02/19/19.Due to allergic reaction her Taxol was changed to Abraxane starting with cycle 3. Completed Chemo on 05/14/19.   INTERVAL HISTORY:  Jocelyn PARCHMENTis here for a follow up and treatment. She notes she fell this morning and fell on her left wrist. She had throbbing pain earlier and has mildly improved now. She did not get to ice it before coming here. She notes she has tolerated her treatment so far and ready to complete her treatment with Abraxane alone.   REVIEW OF SYSTEMS:   Constitutional: Denies fevers, chills or abnormal weight loss Eyes: Denies blurriness of vision Ears, nose, mouth, throat, and face: Denies mucositis or sore throat Respiratory: Denies cough, dyspnea or wheezes Cardiovascular: Denies palpitation, chest discomfort or lower extremity swelling Gastrointestinal:  Denies nausea, heartburn or change in bowel habits Skin: Denies abnormal skin rashes MSK: (+) Left wrist pain and mild swelling  Lymphatics: Denies new lymphadenopathy or easy bruising Neurological:Denies numbness, tingling or new weaknesses Behavioral/Psych: Mood is stable, no new changes  All other systems were reviewed with the patient and are negative.  MEDICAL HISTORY:  Past Medical History:  Diagnosis Date  . Allergy   . [place]   right breast  . Breast cancer of upper-inner quadrant of right female breast ([place] 09/15/2014  . Family history of breast cancer   . Family history of colon cancer   . Family history of pancreatic cancer   . Hot flashes   . Hyperlipidemia   . Hypothyroidism   . Personal history of radiation therapy 2016  . Radiation 11/25/14-01/06/15   Right Breast    SURGICAL HISTORY: Past Surgical History:  Procedure Laterality Date  . AUGMENTATION MAMMAPLASTY Bilateral 03/10/1997  .  BREAST BIOPSY Right 09/11/2014  . BREAST LUMPECTOMY Right 10/20/2014  . BREAST LUMPECTOMY WITH RADIOACTIVE SEED AND SENTINEL LYMPH NODE BIOPSY N/A 01/07/2019   Procedure: LEFT BREAST LUMPECTOMY WITH RADIOACTIVE SEED AND LEFT DEEP AXILLARY SENTINEL LYMPH NODE BIOPSY WITH BLUE DYE INJECTION;  Surgeon: IFanny Skates MD;  [place] [facility]  Service: General;  Laterality: N/A;  . COLONOSCOPY    . DILATION AND CURETTAGE OF UTERUS    . KNEE ARTHROSCOPY    . PLACEMENT OF BREAST IMPLANTS  1995   bilat breast implants  . TONSILLECTOMY    . TUBAL LIGATION    . WISDOM TOOTH EXTRACTION      I have reviewed the social history and family history with the patient and they are unchanged from previous note.  ALLERGIES:  is allergic to docetaxel.  MEDICATIONS:  Current Outpatient Medications  Medication Sig Dispense Refill  . anastrozole (ARIMIDEX) 1 MG tablet Take 1 tablet (1 mg total) by mouth daily. 30 tablet 3  . Ascorbic Acid (VITAMIN C) 1000 MG tablet Take 2,000 mg by mouth daily.     . Calcium Carb-Cholecalciferol (  CALCIUM 600 + D PO) Take 2 tablets by mouth daily.     . cholecalciferol (VITAMIN D3) 25 MCG (1000 UT) tablet Take 1,000 Units by mouth daily.     Marland Kitchen dexamethasone (DECADRON) 4 MG tablet Take 2 tablets (8 mg total) by mouth 2 (two) times daily. Start the day before Taxotere. Then again the day after chemo for 3 days. 30 tablet 1  . HYDROcodone-acetaminophen (NORCO) 5-325 MG tablet Take 1 tablet by mouth every 6 (six) hours as needed for moderate pain or severe pain. 5 tablet 0  . hydrOXYzine (ATARAX/VISTARIL) 25 MG tablet Take 1-2 tablets (25-50 mg total) by mouth every 6 (six) hours as needed. 30 tablet 0  . Melatonin 10 MG CAPS Take 20 mg by mouth at bedtime as needed (sleep).     . methimazole (TAPAZOLE) 5 MG tablet Take 5 mg by mouth daily.    . Multiple Vitamin (MULTIVITAMIN WITH MINERALS) TABS tablet Take 1 tablet by mouth daily.     . Naproxen Sod-diphenhydrAMINE (ALEVE PM) 220-25  MG TABS Take 2 tablets by mouth at bedtime as needed (sleep).    . naproxen sodium (ALEVE) 220 MG tablet Take 440 mg by mouth 2 (two) times daily as needed (pain).    . ondansetron (ZOFRAN) 8 MG tablet Take 1 tablet (8 mg total) by mouth 2 (two) times daily as needed for refractory nausea / vomiting. Start on day 3 after chemo. 30 tablet 1  . prochlorperazine (COMPAZINE) 10 MG tablet Take 1 tablet (10 mg total) by mouth every 6 (six) hours as needed (Nausea or vomiting). 30 tablet 1  . sertraline (ZOLOFT) 100 MG tablet Take 200 mg by mouth daily.     . simvastatin (ZOCOR) 40 MG tablet Take 40 mg by mouth every evening.    . vitamin E 100 UNIT capsule Take 200 Units by mouth daily.      No current facility-administered medications for this visit.     PHYSICAL EXAMINATION: ECOG PERFORMANCE STATUS: 1 - Symptomatic but completely ambulatory  Vitals:   05/14/19 1343  BP: 130/76  Pulse: 71  Resp: 17  Temp: 98.3 F (36.8 C)  SpO2: 97%   Filed Weights   05/14/19 1343  Weight: 149 lb 14.4 oz (68 kg)    GENERAL:alert, no distress and comfortable SKIN: skin color, texture, turgor are normal, no rashes or significant lesions EYES: normal, Conjunctiva are pink and non-injected, sclera clear  NECK: supple, thyroid normal size, non-tender, without nodularity LYMPH:  no palpable lymphadenopathy in the cervical, axillary  LUNGS: clear to auscultation and percussion with normal breathing effort HEART: regular rate & rhythm and no murmurs and no lower extremity edema ABDOMEN:abdomen soft, non-tender and normal bowel sounds Musculoskeletal:no cyanosis of digits and no clubbing  NEURO: alert & oriented x 3 with fluent speech, no focal motor/sensory deficits BREAST: S/p b/l lumpectomy: surgical incision healed well (+) Right breast scar tissue at incision site. No palpable mass, nodules or adenopathy bilaterally. Breast exam benign.   LABORATORY DATA:  I have reviewed the data as listed CBC  Latest Ref Rng & Units 05/14/2019 04/23/2019 04/12/2019  WBC 4.0 - 10.5 K/uL 9.3 7.9 40.3(H)  Hemoglobin 12.0 - 15.0 g/dL 12.2 13.2 12.3  Hematocrit 36.0 - 46.0 % 37.3 40.7 39.2  Platelets 150 - 400 K/uL 255 362 143(L)     CMP Latest Ref Rng & Units 05/14/2019 04/23/2019 04/12/2019  Glucose 70 - 99 mg/dL 126(H) 125(H) 119(H)  BUN 8 - 23  mg/dL _0 Creatinine 0.44 - 1.00 mg/dL 0.75 0.77 0.68  Sodium 135 - 145 mmol/L 143 144 144  Potassium 3.5 - 5.1 mmol/L 4.2 3.8 4.5  Chloride 98 - 111 mmol/L 105 108 110  CO2 22 - 32 mmol/L _1 Calcium 8.9 - 10.3 mg/dL 10.0 9.6 9.5  Total Protein 6.5 - 8.1 g/dL 6.7 6.5 -  Total Bilirubin 0.3 - 1.2 mg/dL 0.4 0.3 -  Alkaline Phos 38 - 126 U/L 110 103 -  AST 15 - 41 U/L 19 15 -  ALT 0 - 44 U/L 20 22 -      RADIOGRAPHIC STUDIES: I have personally reviewed the radiological images as listed and agreed with the findings in the report. No results found.   ASSESSMENT & PLAN:  [name] is a 68 y.o. female with   1.Malignant neoplasm of upper-inner quadrant of left breast,invasive lobular carcinoma,StageIA,pT1c,N0,M0, ER+/PR+, HER2-, GradeII, RS 33 -She was diagnosed in 10/2018. She started neoadjuvant anastrozole in 4/2020due to the delay of her surgery due to [place] pandemic.This was held after 01/27/19 for further cancer treatment. -She underwent left breast lumpectomy on 01/07/19. We discussed herpathologyreport in great detail which shows complete resection, node negative. -Gust her Oncotype DX test results, which showed recurrence score of 33. Her predicted distant recurrence with adjuvant antiestrogen therapy is about 21%.Given her high risk disease, she would benefit from adjuvant chemotherapyfollowed by radiation and then restarting anti-estrogen with Anastrozole.  -I started her onmoderate intensity docetaxel and Cytoxan (TC) every3 weeks for 4 cycleson 02/19/19.Shetolerated first cycle very well except had significant  fatigue and bone pain from Udenyca injection. She had allergic reaction to Taxol with C2 during infusion. She was able to complete Cytoxan that cycle but not much Taxol. I switched taxol to Abraxane with C3. She has been tolerating very well.  -Labs reviewed, CBC and CMP WNL except BG 126. Overall adequate to proceed with last cycle chemo with Abraxane alone (she has received 4 cycles of cytoxan). She will not need day 3 injection.  -She will proceed with adjuvant radiation soon and afterward proceed with antiestrogen therapy.  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 She is s/p rightlumpectomyand SLNB and adjuvant radiation.  -Given her negative ER/PR status, there is no benefit of antiestrogen therapy. Wepreviouslydiscussed chemoprevention for breast cancer in general, and I do not recommend it given her history of ER/PR negative DCIS -Continuesurveillance. No evidence of recurrence.   3. Genetic BRCA plus panel was negative for pathogenetic mutations  4. Bone health  -She has not had DEXA scan in years  -Will repeat later this year as she will be on anastrozole which can weaken her bone.  PLAN: -Labs reviewed andadequateto proceed with last cycle single agent Abraxane today, no need GCSF  -Send referral to [facility]  -Lab and f/u in 2 months or her last week of radiation    No problem-specific Assessment & Plan notes found for this encounter.   No orders of the defined types were placed in this encounter.  All questions were answered. The patient knows to call the clinic with any problems, questions or concerns. No barriers to learning was detected. I spent 20 minutes counseling the patient face to face. The total time spent in the appointment was 25 minutes and more than 50% was on counseling and review of test results     [name],  MD 05/14/2019   Oneal Deputy, am acting as scribe for [name], MD.    I have reviewed the above documentation for accuracy and completeness, and I agree with the above.

## 2019-05-14 ENCOUNTER — Inpatient Hospital Stay (HOSPITAL_BASED_OUTPATIENT_CLINIC_OR_DEPARTMENT_OTHER): Payer: PPO | Admitting: Hematology

## 2019-05-14 ENCOUNTER — Inpatient Hospital Stay: Payer: PPO | Attending: Hematology

## 2019-05-14 ENCOUNTER — Other Ambulatory Visit: Payer: Self-pay

## 2019-05-14 ENCOUNTER — Inpatient Hospital Stay: Payer: PPO

## 2019-05-14 ENCOUNTER — Encounter: Payer: Self-pay | Admitting: Hematology

## 2019-05-14 VITALS — BP 130/76 | HR 71 | Temp 98.3°F | Resp 17 | Ht 61.0 in | Wt 149.9 lb

## 2019-05-14 DIAGNOSIS — Z170 Estrogen receptor positive status [ER+]: Secondary | ICD-10-CM

## 2019-05-14 DIAGNOSIS — Z5111 Encounter for antineoplastic chemotherapy: Secondary | ICD-10-CM | POA: Diagnosis not present

## 2019-05-14 DIAGNOSIS — C50212 Malignant neoplasm of upper-inner quadrant of left female breast: Secondary | ICD-10-CM

## 2019-05-14 LAB — CBC WITH DIFFERENTIAL (CANCER CENTER ONLY)
Abs Immature Granulocytes: 0.04 10*3/uL (ref 0.00–0.07)
Basophils Absolute: 0.1 10*3/uL (ref 0.0–0.1)
Basophils Relative: 1 %
Eosinophils Absolute: 0 10*3/uL (ref 0.0–0.5)
Eosinophils Relative: 0 %
HCT: 37.3 % (ref 36.0–46.0)
Hemoglobin: 12.2 g/dL (ref 12.0–15.0)
Immature Granulocytes: 0 %
Lymphocytes Relative: 21 %
Lymphs Abs: 2 10*3/uL (ref 0.7–4.0)
MCH: 29.1 pg (ref 26.0–34.0)
MCHC: 32.7 g/dL (ref 30.0–36.0)
MCV: 89 fL (ref 80.0–100.0)
Monocytes Absolute: 0.6 10*3/uL (ref 0.1–1.0)
Monocytes Relative: 6 %
Neutro Abs: 6.7 10*3/uL (ref 1.7–7.7)
Neutrophils Relative %: 72 %
Platelet Count: 255 10*3/uL (ref 150–400)
RBC: 4.19 MIL/uL (ref 3.87–5.11)
RDW: 14.6 % (ref 11.5–15.5)
WBC Count: 9.3 10*3/uL (ref 4.0–10.5)
nRBC: 0 % (ref 0.0–0.2)

## 2019-05-14 LAB — CMP (CANCER CENTER ONLY)
ALT: 20 U/L (ref 0–44)
AST: 19 U/L (ref 15–41)
Albumin: 4.1 g/dL (ref 3.5–5.0)
Alkaline Phosphatase: 110 U/L (ref 38–126)
Anion gap: 10 (ref 5–15)
BUN: 17 mg/dL (ref 8–23)
CO2: 28 mmol/L (ref 22–32)
Calcium: 10 mg/dL (ref 8.9–10.3)
Chloride: 105 mmol/L (ref 98–111)
Creatinine: 0.75 mg/dL (ref 0.44–1.00)
GFR, Est AFR Am: >60 mL/min (ref >60)
GFR, Estimated: >60 mL/min (ref >60)
Glucose, Bld: 126 mg/dL — ABNORMAL HIGH (ref 70–99)
Potassium: 4.2 mmol/L (ref 3.5–5.1)
Sodium: 143 mmol/L (ref 135–145)
Total Bilirubin: 0.4 mg/dL (ref 0.3–1.2)
Total Protein: 6.7 g/dL (ref 6.5–8.1)

## 2019-05-14 MED ORDER — DEXAMETHASONE SODIUM PHOSPHATE 10 MG/ML IJ SOLN
10.0000 mg | Freq: Once | INTRAMUSCULAR | Status: AC
  Administered 2019-05-14: 10 mg via INTRAVENOUS

## 2019-05-14 MED ORDER — FAMOTIDINE IN NACL 20-0.9 MG/50ML-% IV SOLN
INTRAVENOUS | Status: AC
  Filled 2019-05-14: qty 50

## 2019-05-14 MED ORDER — FAMOTIDINE IN NACL 20-0.9 MG/50ML-% IV SOLN
20.0000 mg | Freq: Once | INTRAVENOUS | Status: AC
  Administered 2019-05-14: 20 mg via INTRAVENOUS

## 2019-05-14 MED ORDER — DEXAMETHASONE SODIUM PHOSPHATE 10 MG/ML IJ SOLN
INTRAMUSCULAR | Status: AC
  Filled 2019-05-14: qty 1

## 2019-05-14 MED ORDER — PACLITAXEL PROTEIN-BOUND CHEMO INJECTION 100 MG
400.0000 mg | Freq: Once | INTRAVENOUS | Status: AC
  Administered 2019-05-14: 400 mg via INTRAVENOUS
  Filled 2019-05-14: qty 80

## 2019-05-14 MED ORDER — DIPHENHYDRAMINE HCL 50 MG/ML IJ SOLN
INTRAMUSCULAR | Status: AC
  Filled 2019-05-14: qty 1

## 2019-05-14 MED ORDER — SODIUM CHLORIDE 0.9 % IV SOLN
Freq: Once | INTRAVENOUS | Status: AC
  Administered 2019-05-14: 15:00:00 via INTRAVENOUS
  Filled 2019-05-14: qty 250

## 2019-05-14 MED ORDER — DIPHENHYDRAMINE HCL 50 MG/ML IJ SOLN
25.0000 mg | Freq: Once | INTRAMUSCULAR | Status: AC
  Administered 2019-05-14: 25 mg via INTRAVENOUS

## 2019-05-14 MED ORDER — PALONOSETRON HCL INJECTION 0.25 MG/5ML
INTRAVENOUS | Status: AC
  Filled 2019-05-14: qty 5

## 2019-05-14 MED ORDER — PALONOSETRON HCL INJECTION 0.25 MG/5ML
0.2500 mg | Freq: Once | INTRAVENOUS | Status: AC
  Administered 2019-05-14: 15:00:00 0.25 mg via INTRAVENOUS

## 2019-05-14 NOTE — Patient Instructions (Signed)
[facility] Discharge Instructions for Patients Receiving Chemotherapy  Today you received the following chemotherapy agents Paclitaxel-protein bound (ABRAXANE).  To help prevent nausea and vomiting after your treatment, we encourage you to take your nausea medication as prescribed.   If you develop nausea and vomiting that is not controlled by your nausea medication, call the clinic.   BELOW ARE SYMPTOMS THAT SHOULD BE REPORTED IMMEDIATELY:  *FEVER GREATER THAN 100.5 F  *CHILLS WITH OR WITHOUT FEVER  NAUSEA AND VOMITING THAT IS NOT CONTROLLED WITH YOUR NAUSEA MEDICATION  *UNUSUAL SHORTNESS OF BREATH  *UNUSUAL BRUISING OR BLEEDING  TENDERNESS IN MOUTH AND THROAT WITH OR WITHOUT PRESENCE OF ULCERS  *URINARY PROBLEMS  *BOWEL PROBLEMS  UNUSUAL RASH Items with * indicate a potential emergency and should be followed up as soon as possible.  Feel free to call the clinic should you have any questions or concerns. The clinic phone number is [phone].  Please show the CHEMO ALERT CARD at check-in to the Emergency Department and triage nurse.  Coronavirus (COVID-19) Are you at risk?  Are you at risk for the Coronavirus (COVID-19)?  To be considered HIGH RISK for Coronavirus (COVID-19), you have to meet the following criteria:  . Traveled to [place], [place], [place], [place] or [place]; or in the [place] to [place], [place], [place], or [place]; and have fever, cough, and shortness of breath within the last 2 weeks of travel OR . Been in close contact with a person diagnosed with COVID-19 within the last 2 weeks and have fever, cough, and shortness of breath . IF YOU DO NOT MEET THESE CRITERIA, YOU ARE CONSIDERED LOW RISK FOR COVID-19.  What to do if you are HIGH RISK for COVID-19?  . If you are having a medical emergency, call 911. . Seek medical care right away. Before you go to a doctor's office, urgent care or emergency department, call ahead  and tell them about your recent travel, contact with someone diagnosed with COVID-19, and your symptoms. You should receive instructions from your physician's office regarding next steps of care.  . When you arrive at healthcare provider, tell the healthcare staff immediately you have returned from visiting [place], [place], [place], [place] or [place]; or traveled in the [place] to [place], [place], [place], or [place]; in the last two weeks or you have been in close contact with a person diagnosed with COVID-19 in the last 2 weeks.   . Tell the health care staff about your symptoms: fever, cough and shortness of breath. . After you have been seen by a medical provider, you will be either: o Tested for (COVID-19) and discharged home on quarantine except to seek medical care if symptoms worsen, and asked to  - Stay home and avoid contact with others until you get your results (4-5 days)  - Avoid travel on public transportation if possible (such as bus, train, or airplane) or o Sent to the Emergency Department by EMS for evaluation, COVID-19 testing, and possible admission depending on your condition and test results.  What to do if you are LOW RISK for COVID-19?  Reduce your risk of any infection by using the same precautions used for avoiding the common cold or flu:  . Wash your hands often with soap and warm water for at least 20 seconds.  If soap and water are not readily available, use an alcohol-based hand sanitizer with at least 60% alcohol.  . If coughing   or sneezing, cover your mouth and nose by coughing or sneezing into the elbow areas of your shirt or coat, into a tissue or into your sleeve (not your hands). . Avoid shaking hands with others and consider head nods or verbal greetings only. . Avoid touching your eyes, nose, or mouth with unwashed hands.  . Avoid close contact with people who are sick. . Avoid places or events with large numbers of people in one location, like  concerts or sporting events. . Carefully consider travel plans you have or are making. . If you are planning any travel outside or inside the [place], visit the CDC's Travelers' Health webpage for the latest health notices. . If you have some symptoms but not all symptoms, continue to monitor at home and seek medical attention if your symptoms worsen. . If you are having a medical emergency, call 911.   ADDITIONAL HEALTHCARE OPTIONS FOR PATIENTS  [facility] / e-Visit: [email or web address]         [facility]: [phone]  [facility]: [phone]                   [facility]: [phone]   

## 2019-05-15 ENCOUNTER — Telehealth: Payer: Self-pay | Admitting: Hematology

## 2019-05-15 ENCOUNTER — Telehealth: Payer: Self-pay

## 2019-05-15 ENCOUNTER — Other Ambulatory Visit: Payer: Self-pay | Admitting: *Deleted

## 2019-05-15 DIAGNOSIS — C50212 Malignant neoplasm of upper-inner quadrant of left female breast: Secondary | ICD-10-CM

## 2019-05-15 DIAGNOSIS — Z170 Estrogen receptor positive status [ER+]: Secondary | ICD-10-CM

## 2019-05-15 NOTE — Telephone Encounter (Signed)
Scheduled appt per 10/7 los.  Referral has not been entered in yet.  Sent a staff message to get a calendar mailed out.

## 2019-05-15 NOTE — Telephone Encounter (Signed)
Patient called and left voicemail x2. First call was to clarify if she would be receiving radiation for 4-6 weeks or months, and whether or not she could drink wine while undergoing treatment  Her second voicemail stated that her face was flushed and her eyelids were itching. She stated she had 3 prednisone tablets left over from another rash she got back in September if she needed to take those.   Updated Dr. Burr Medico who said raditation would be 4-6 weeks and that patient needed to abstain from alcohol for 2 weeks after finishing chemo. She could have no more than 1 glass of wine a day afterwards, but she should also verify with her radiation oncologist once she starts treatment. For the rash she recommended patient take Benadryl OTC every 6-8 hours and if she had Pepcid OTC to take 2 tablets twice a day. If her symptoms don't improve or get worse she should call back so we can get her scheduled to be evaluated in our [facility].   Called patient back and updated her on above information. Patient stated the redness was only located on her cheeks and she denied any SOB or chest tightness. Patient stated she would get the Benadryl and Pepcid, and confirmed she would call tomorrow if symptoms don't improve.

## 2019-05-16 ENCOUNTER — Telehealth: Payer: Self-pay

## 2019-05-16 NOTE — Telephone Encounter (Signed)
Called patient to check on her and see if her symptoms from yesterday had improved. She stated she still had mild eye itchiness and some redness to her cheeks, but overall she was feeling better and thought everything was improving. She confirmed she was able to get the Pepcid and Benadryl OTC and has been taking both around the clock. Instructed patient to continue this regimen over the weekend and to call our office next week if symptoms didn't resolve or worsened. Patient verbalized understanding and agreement, and denied any other needs at this time.

## 2019-05-20 DIAGNOSIS — E0590 Thyrotoxicosis, unspecified without thyrotoxic crisis or storm: Secondary | ICD-10-CM | POA: Diagnosis not present

## 2019-05-20 NOTE — Telephone Encounter (Signed)
Thank you. LB

## 2019-05-21 DIAGNOSIS — Z01419 Encounter for gynecological examination (general) (routine) without abnormal findings: Secondary | ICD-10-CM | POA: Diagnosis not present

## 2019-05-21 DIAGNOSIS — Z6829 Body mass index (BMI) 29.0-29.9, adult: Secondary | ICD-10-CM | POA: Diagnosis not present

## 2019-05-21 DIAGNOSIS — C50919 Malignant neoplasm of unspecified site of unspecified female breast: Secondary | ICD-10-CM | POA: Diagnosis not present

## 2019-05-21 DIAGNOSIS — Z124 Encounter for screening for malignant neoplasm of cervix: Secondary | ICD-10-CM | POA: Diagnosis not present

## 2019-05-27 DIAGNOSIS — Z23 Encounter for immunization: Secondary | ICD-10-CM | POA: Diagnosis not present

## 2019-05-27 DIAGNOSIS — E0590 Thyrotoxicosis, unspecified without thyrotoxic crisis or storm: Secondary | ICD-10-CM | POA: Diagnosis not present

## 2019-05-27 DIAGNOSIS — Z6827 Body mass index (BMI) 27.0-27.9, adult: Secondary | ICD-10-CM | POA: Diagnosis not present

## 2019-05-27 DIAGNOSIS — C50912 Malignant neoplasm of unspecified site of left female breast: Secondary | ICD-10-CM | POA: Diagnosis not present

## 2019-05-27 DIAGNOSIS — M79642 Pain in left hand: Secondary | ICD-10-CM | POA: Diagnosis not present

## 2019-05-27 DIAGNOSIS — E041 Nontoxic single thyroid nodule: Secondary | ICD-10-CM | POA: Diagnosis not present

## 2019-05-27 DIAGNOSIS — E0500 Thyrotoxicosis with diffuse goiter without thyrotoxic crisis or storm: Secondary | ICD-10-CM | POA: Diagnosis not present

## 2019-05-30 DIAGNOSIS — M8580 Other specified disorders of bone density and structure, unspecified site: Secondary | ICD-10-CM | POA: Diagnosis not present

## 2019-06-03 ENCOUNTER — Telehealth: Payer: Self-pay

## 2019-06-03 NOTE — Telephone Encounter (Signed)
Patient calls wanting to know if it is okay to go for a dental cleaning.  Advised her that it is fine.

## 2019-06-05 NOTE — Progress Notes (Signed)
Location of Breast Cancer: Malignant neoplasm of upper-inner quadrant of left breast,invasive lobular carcinoma,StageIA,pT1c,N0,M0, ER+/PR+, HER2-, GradeII, RS 33  Histology per Pathology Report: 01/07/19:Diagnosis 1. Breast, lumpectomy, Left w/seed - INVASIVE LOBULAR CARCINOMA, GRADE 2, 1.5 CM. SEE NOTE - CARCINOMA IS 0.4 CM FROM THE SUPERIOR MARGIN AND 0.6 CM FROM THE INFERIOR MARGIN - NO EVIDENCE OF LYMPHOVASCULAR OR PERINEURAL INVASION - BIOPSY SITE CHANGES - SEE ONCOLOGY TABLE 2. Lymph node, sentinel, biopsy, Left axillary #1 - LYMPH NODE, NEGATIVE FOR CARCINOMA (0/1). SEE NOTE 3. Lymph node, sentinel, biopsy, Left axillary #2 - LYMPH NODE, NEGATIVE FOR CARCINOMA (0/1). SEE NOTE 4. Lymph node, sentinel, biopsy, Left - LYMPH NODE, NEGATIVE FOR CARCINOMA (0/1). SEE NOTE 5. Lymph node, sentinel, biopsy, Left - LYMPH NODE, NEGATIVE FOR CARCINOMA (0/1). SEE NOTE 6. Lymph node, sentinel, biopsy, Left axillary #3 - LYMPH NODE, NEGATIVE FOR CARCINOMA (0/1). SEE NOTE Microscopic Comment 1. INVASIVE CARCINOMA OF THE BREAST: Resection   Receptor Status: ER (100%), PR (80%), Her2-neu (negative)  Did patient present with symptoms (if so, please note symptoms) or was this found on screening mammography?: She notes she did not feel the lump herself or had any skin or breast change. This was found by screening mammogram.  Past/Anticipated interventions by surgeon, if any: 01/07/19: Procedure:                 Inject blue dye left breast                                      Left breast lumpectomy with radioactive seed localization                                      Left axillary deep sentinel lymph node biopsy  Surgeon:                     Edsel Petrin. Dalbert Batman, M.D., [facility]   Past/Anticipated interventions by medical oncology, if any: Chemotherapy Per Dr. Burr Medico 05/14/19: CURRENT THERAPY:  -Anastrozole 71m daily starting 11/2018 (before surgery), held on 01/27/19 for further cancer treatment   -Adjuvant TC every 3 weeks for 4 cycles starting 02/19/19.Due to allergic reaction her Taxol was changed to Abraxane starting with cycle 3.Completed Chemo on 05/14/19 PLAN: -Labs reviewed andadequateto proceed with last cycle single agent Abraxane today, no need GCSF  -[facility] referral to [facility] -Lab and f/u in 2 months or her last week of radiation    Lymphedema issues, if any:  none  Pain issues, if any: in foot from stepping in a hole  SAFETY ISSUES: Prior radiation? Per Dr. WPablo Ledger Radiation treatment dates:   11/25/2014-01/06/2015  Site/dose:    Right breast / 50 Gray @ 2 GPearline Cablesper fraction x 25 fractions Right breast boost /10 GPearline Cablesat 2 G[place]per fraction x 5 fractions  Beams/energy:  Opposed Tangents / 6 MV photons  En face / 6 and 9 MeV electrons 2  Pacemaker/ICD? no  Possible current pregnancy? No  Is the patient on methotrexate? No  Current Complaints / other details:  Patient in for consult for breast cancer to Left breast she finished chemotherapy on 05-14-2019. She has no issues or complaints of except she stepped in a hole a couple of weeks ag and her foot continues to hurt and is swollen. She  has not been seen about this issue.BP 131/72 (BP Location: Right Arm, Patient Position: Sitting)   Pulse 67   Temp 98.3 F (36.8 C) (Temporal)   Resp 16   Ht 5' 1"  (1.549 m)   Wt 149 lb 6.4 oz (67.8 kg)   SpO2 98%   BMI 28.23 kg/m    Previously treated RIGHT breast cancer.    Loma Sousa, RN 06/05/2019,12:02 PM

## 2019-06-09 ENCOUNTER — Ambulatory Visit
Admission: RE | Admit: 2019-06-09 | Discharge: 2019-06-09 | Disposition: A | Discharge status: Home / Self Care | Payer: PPO | Source: Ambulatory Visit | Attending: Radiation Oncology | Admitting: Radiation Oncology

## 2019-06-09 ENCOUNTER — Encounter: Payer: Self-pay | Admitting: Radiation Oncology

## 2019-06-09 ENCOUNTER — Other Ambulatory Visit: Payer: Self-pay

## 2019-06-09 VITALS — BP 131/72 | HR 67 | Temp 98.3°F | Resp 16 | Ht 61.0 in | Wt 149.4 lb

## 2019-06-09 DIAGNOSIS — C50212 Malignant neoplasm of upper-inner quadrant of left female breast: Secondary | ICD-10-CM

## 2019-06-09 DIAGNOSIS — Z86000 Personal history of in-situ neoplasm of breast: Secondary | ICD-10-CM | POA: Diagnosis not present

## 2019-06-09 DIAGNOSIS — Z9221 Personal history of antineoplastic chemotherapy: Secondary | ICD-10-CM | POA: Diagnosis not present

## 2019-06-09 DIAGNOSIS — Z170 Estrogen receptor positive status [ER+]: Secondary | ICD-10-CM

## 2019-06-09 DIAGNOSIS — Z803 Family history of malignant neoplasm of breast: Secondary | ICD-10-CM | POA: Diagnosis not present

## 2019-06-09 DIAGNOSIS — Z923 Personal history of irradiation: Secondary | ICD-10-CM | POA: Diagnosis not present

## 2019-06-09 DIAGNOSIS — Z510 Encounter for antineoplastic radiation therapy: Secondary | ICD-10-CM | POA: Diagnosis not present

## 2019-06-09 DIAGNOSIS — Z9889 Other specified postprocedural states: Secondary | ICD-10-CM | POA: Diagnosis not present

## 2019-06-09 NOTE — Progress Notes (Signed)
Radiation Oncology         [phone] ________________________________  Initial Outpatient Consultation  Name: [name] MRN: [id number]  Date: 06/09/2019  DOB: [birth year]  QA:[id number], Hinton Dyer, DO  [name], MD   REFERRING PHYSICIAN: [name], MD  DIAGNOSIS: The encounter diagnosis was Malignant neoplasm of upper-inner quadrant of left breast in female, estrogen receptor positive ([place]).  Stage 1A (pT1c) Left Breast UIQ, Invasive Lobular Carcinoma, ER+ / PR+ / Her2-, Grade 2  HISTORY OF PRESENT ILLNESS::[name] is a 68 y.o. female who is accompanied by no one due to COVID-19 restrictions. 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 She underwent a lumpectomy on 10/20/2014 and had radiation treatment from 11/25/2014 to 01/06/2015 under Dr [name].  She had a routine diagnostic mammography on 10/18/2018 showing a possible abnormality in the left breast. She proceeded to left breast ultrasonography showing two adjacent suspicious masses in the left breast at 10 o'clock. The masses were 28m apart and in total span of 1.7cm.  Biopsy on 10/18/2018 showed: invasive mammary carcinoma, grade 2; mammary carcinoma in situ. E-cadherin is negative supporting lobular origin. Prognostic indicators significant for: estrogen receptor, 100% positive and progesterone receptor, 80% positive. Proliferation marker Ki67 at 2%. HER2 negative by FISH.  She underwent bilateral breast MRI on 10/30/2018, which showed no additional suspicious areas of enhancement.  She underwent genetic testing on 11/12/2018, which was negative. A VUS was identified on AXIN2.  She opted to proceed with left breast lumpectomy on 01/07/2019 showing: invasive lobular carcinoma, grade 2, 1.5cm. Margins are negative for carcinoma. Five lymph nodes were examined, all negative.  Oncotype DX was obtained on the final surgical sample and the recurrence score of 33 predicts a risk of recurrence outside the  breast over the next 9 years of 21%, if the patient's only systemic therapy is an antiestrogen for 5 years. It also predicts a significant benefit from chemotherapy.  She underwent chemotherapy from 02/19/2019 to 05/14/2019 under the care of Dr. FBurr Medico Per Dr. FErnestina Pennanote, she received Adjuvant TC every 3 weeks for 4 cycles. She had an allergic reaction to Taxol with C2 during infusion. She was able to complete Cytoxan that cycle but not much. She was switched from taxol to Abraxane with C3.   PREVIOUS RADIATION THERAPY: Yes  Patient underwent radiation treatment from 11/25/2014 to 01/06/2015. Right breast / 50 Gray @ 2 Gray per fraction x 25 fractions. Right breast boost / 10 Gray at 2 G[place]oux Cityper fraction x 5 fractions.   PAST MEDICAL HISTORY:  Past Medical History:  Diagnosis Date  . Allergy   . [place] [place]   [place] breast  . Breast cancer in female ([facility] 01/2019  . Breast cancer of [place]inner quadrant of right [place] breast [place] [place]  . Family history of breast cancer   . Family history of colon cancer   . Family history of pancreatic cancer   . Hot flashes   . Hyperlipidemia   . Hypothyroidism   . Personal history of radiation therapy 2016  . Radiation 11/25/14-01/06/15   Right Breast    PAST SURGICAL HISTORY: Past Surgical History:  Procedure Laterality Date  . AUGMENTATION MAMMAPLASTY Bilateral 03/10/1997  . BREAST BIOPSY Right 09/11/2014  . BREAST LUMPECTOMY Right 10/20/2014  . BREAST LUMPECTOMY WITH RADIOACTIVE SEED AND SENTINEL LYMPH NODE BIOPSY N/A 01/07/2019   Procedure: LEFT BREAST LUMPECTOMY WITH RADIOACTIVE SEED AND LEFT DEEP AXILLARY SENTINEL LYMPH NODE BIOPSY WITH  BLUE DYE INJECTION;  Surgeon: Fanny Skates, MD;  Location: [facility];  Service: General;  Laterality: N/A;  . COLONOSCOPY    . DILATION AND CURETTAGE OF UTERUS    . KNEE ARTHROSCOPY    . PLACEMENT OF BREAST IMPLANTS  1995   bilat breast implants  . TONSILLECTOMY    . TUBAL LIGATION    .  WISDOM TOOTH EXTRACTION      FAMILY HISTORY:  Family History  Problem Relation Age of Onset  . Breast cancer Father 8  . Prostate cancer Father 46  . Throat cancer Father 76  . Cancer Father   . Breast cancer Mother        in early 51's  . Pancreatic cancer Maternal Uncle 75  . Cancer Maternal Uncle   . Pancreatic cancer Maternal Grandmother 27  . Cancer Maternal Grandmother   . Cancer Maternal Grandfather 19       leukemia  . Alcohol abuse Brother   . Alcohol abuse Paternal Uncle   . Lung cancer Maternal Uncle   . Colon cancer Maternal Uncle   . Colon polyps Neg Hx   . Esophageal cancer Neg Hx   . Rectal cancer Neg Hx   . Stomach cancer Neg Hx     SOCIAL HISTORY:  Social History   Tobacco Use  . Smoking status: Never Smoker  . Smokeless tobacco: Never Used  Substance Use Topics  . Alcohol use: Yes    Comment: occasional  . Drug use: No    ALLERGIES:  Allergies  Allergen Reactions  . Docetaxel     SOB, chest pressure, dizziness    MEDICATIONS:  Current Outpatient Medications  Medication Sig Dispense Refill  . Ascorbic Acid (VITAMIN C) 1000 MG tablet Take 2,000 mg by mouth daily.     . Calcium Carb-Cholecalciferol (CALCIUM 600 + D PO) Take 2 tablets by mouth daily.     . cholecalciferol (VITAMIN D3) 25 MCG (1000 UT) tablet Take 1,000 Units by mouth daily.     Marland Kitchen HYDROcodone-acetaminophen (NORCO) 5-325 MG tablet Take 1 tablet by mouth every 6 (six) hours as needed for moderate pain or severe pain. 5 tablet 0  . Melatonin 10 MG CAPS Take 20 mg by mouth at bedtime as needed (sleep).     . methimazole (TAPAZOLE) 5 MG tablet Take 5 mg by mouth daily.    . Multiple Vitamin (MULTIVITAMIN WITH MINERALS) TABS tablet Take 1 tablet by mouth daily.     . Naproxen Sod-diphenhydrAMINE (ALEVE PM) 220-25 MG TABS Take 2 tablets by mouth at bedtime as needed (sleep).    . naproxen sodium (ALEVE) 220 MG tablet Take 440 mg by mouth 2 (two) times daily as needed (pain).    Marland Kitchen  sertraline (ZOLOFT) 100 MG tablet Take 200 mg by mouth daily.     . simvastatin (ZOCOR) 40 MG tablet Take 40 mg by mouth every evening.    . vitamin E 100 UNIT capsule Take 200 Units by mouth daily.     Marland Kitchen anastrozole (ARIMIDEX) 1 MG tablet Take 1 tablet (1 mg total) by mouth daily. (Patient not taking: Reported on 06/09/2019) 30 tablet 3  . hydrOXYzine (ATARAX/VISTARIL) 25 MG tablet Take 1-2 tablets (25-50 mg total) by mouth every 6 (six) hours as needed. (Patient not taking: Reported on 06/09/2019) 30 tablet 0   No current facility-administered medications for this encounter.     REVIEW OF SYSTEMS:  A 10+ POINT REVIEW OF SYSTEMS WAS OBTAINED including neurology,  dermatology, psychiatry, cardiac, respiratory, lymph, extremities, GI, GU, musculoskeletal, constitutional, reproductive, HEENT.  She reported no pain in the breast area nipple discharge or bleeding prior to diagnosis.  She denies any new bony pain headaches dizziness or blurred vision   PHYSICAL EXAM:  height is _0  (1.549 m) and weight is 149 lb 6.4 oz (67.8 kg). Her temporal temperature is 98.3 F (36.8 C). Her blood pressure is 131/72 and her pulse is 67. Her respiration is 16 and oxygen saturation is 98%.   General: Alert and oriented, in no acute distress HEENT: Head is normocephalic. Extraocular movements are intact. Oropharynx is clear. Neck: Neck is supple, no palpable cervical or supraclavicular lymphadenopathy. Heart: Regular in rate and rhythm with no murmurs, rubs, or gallops. Chest: Clear to auscultation bilaterally, with no rhonchi, wheezes, or rales. Abdomen: Soft, nontender, nondistended, with no rigidity or guarding. Extremities: No cyanosis or edema. Lymphatics: see Neck Exam Skin: No concerning lesions. Musculoskeletal: symmetric strength and muscle tone throughout. Neurologic: Cranial nerves II through XII are grossly intact. No obvious focalities. Speech is fluent. Coordination is intact. Psychiatric: Judgment  and insight are intact. Affect is appropriate. Right breast no palpable mass nipple discharge or bleeding.  Scar present from prior lumpectomy.  Patient appears to have cosmetic implant in place.  The left breast shows a periareolar scar which is healing well without signs of drainage or infection.  Separate axillary scar also healing well without signs of drainage or infection.  Cosmetic implant in place along the left breast.  ECOG = 1  0 - Asymptomatic (Fully active, able to carry on all predisease activities without restriction)  1 - Symptomatic but completely ambulatory (Restricted in physically strenuous activity but ambulatory and able to carry out work of a light or sedentary nature. For example, light housework, office work)  2 - Symptomatic, <50% in bed during the day (Ambulatory and capable of all self care but unable to carry out any work activities. Up and about more than 50% of waking hours)  3 - Symptomatic, >50% in bed, but not bedbound (Capable of only limited self-care, confined to bed or chair 50% or more of waking hours)  4 - Bedbound (Completely disabled. Cannot carry on any self-care. Totally confined to bed or chair)  5 - Death   Eustace Pen MM, Creech RH, Tormey DC, et al. [phone]). "Toxicity and response criteria of the [facility]". [place] Oncol. 5 (6): 649-55  LABORATORY DATA:  Lab Results  Component Value Date   WBC 9.3 05/14/2019   HGB 12.2 05/14/2019   HCT 37.3 05/14/2019   MCV 89.0 05/14/2019   PLT 255 05/14/2019   NEUTROABS 6.7 05/14/2019   Lab Results  Component Value Date   NA 143 05/14/2019   K 4.2 05/14/2019   CL 105 05/14/2019   CO2 28 05/14/2019   GLUCOSE 126 (H) 05/14/2019   CREATININE 0.75 05/14/2019   CALCIUM 10.0 05/14/2019      RADIOGRAPHY: No results found.    IMPRESSION: Stage 1A (pT1c) Left Breast UIQ, Invasive Lobular Carcinoma, ER+ / PR+ / Her2-, Grade 2.  The patient will be a good candidate for breast  conservation with radiation therapy directed to the left breast.  Given the patient's prior history of right-sided breast cancer with radiation treatment and potential for overlap over the sternum I would not recommend hypofractionated accelerated radiation therapy in this situation.  Today, I talked to the patient and about the findings and work-up thus  far.  We discussed the natural history of breast cancer and general treatment, highlighting the role of radiotherapy in the management.  We discussed the available radiation techniques, and focused on the details of logistics and delivery.  We reviewed the anticipated acute and late sequelae associated with radiation in this setting.  The patient was encouraged to ask questions that I answered to the best of my ability.  A patient consent form was discussed and signed.  We retained a copy for our records.  The patient would like to proceed with radiation treatment.  PLAN: Patient will proceed with CT simulation later this morning.  Anticipate radiation treatment to start in 1 week.  Anticipate 6-1/2 weeks of radiation therapy    ------------------------------------------------  Blair Promise, PhD, MD  This document serves as a record of services personally performed by Gery Pray, MD. It was created on his behalf by Clerance Lav, a trained medical scribe. The creation of this record is based on the scribe's personal observations and the provider's statements to them. This document has been checked and approved by the attending provider.

## 2019-06-09 NOTE — Patient Instructions (Signed)
Coronavirus (COVID-19) Are you at risk?  Are you at risk for the Coronavirus (COVID-19)?  To be considered HIGH RISK for Coronavirus (COVID-19), you have to meet the following criteria:  . Traveled to [place], [place], [place], [place] or [place]; or in the [place] to [place], [place], [place], or [place]; and have fever, cough, and shortness of breath within the last 2 weeks of travel OR . Been in close contact with a person diagnosed with COVID-19 within the last 2 weeks and have fever, cough, and shortness of breath . IF YOU DO NOT MEET THESE CRITERIA, YOU ARE CONSIDERED LOW RISK FOR COVID-19.  What to do if you are HIGH RISK for COVID-19?  . If you are having a medical emergency, call 911. . Seek medical care right away. Before you go to a doctor's office, urgent care or emergency department, call ahead and tell them about your recent travel, contact with someone diagnosed with COVID-19, and your symptoms. You should receive instructions from your physician's office regarding next steps of care.  . When you arrive at healthcare provider, tell the healthcare staff immediately you have returned from visiting [place], [place], [place], [place] or [place]; or traveled in the [place] to [place], [place], [place], or [place]; in the last two weeks or you have been in close contact with a person diagnosed with COVID-19 in the last 2 weeks.   . Tell the health care staff about your symptoms: fever, cough and shortness of breath. . After you have been seen by a medical provider, you will be either: o Tested for (COVID-19) and discharged home on quarantine except to seek medical care if symptoms worsen, and asked to  - Stay home and avoid contact with others until you get your results (4-5 days)  - Avoid travel on public transportation if possible (such as bus, train, or airplane) or o Sent to the Emergency Department by EMS for evaluation, COVID-19 testing, and possible  admission depending on your condition and test results.  What to do if you are LOW RISK for COVID-19?  Reduce your risk of any infection by using the same precautions used for avoiding the common cold or flu:  . Wash your hands often with soap and warm water for at least 20 seconds.  If soap and water are not readily available, use an alcohol-based hand sanitizer with at least 60% alcohol.  . If coughing or sneezing, cover your mouth and nose by coughing or sneezing into the elbow areas of your shirt or coat, into a tissue or into your sleeve (not your hands). . Avoid shaking hands with others and consider head nods or verbal greetings only. . Avoid touching your eyes, nose, or mouth with unwashed hands.  . Avoid close contact with people who are sick. . Avoid places or events with large numbers of people in one location, like concerts or sporting events. . Carefully consider travel plans you have or are making. . If you are planning any travel outside or inside the [place], visit the CDC's Travelers' Health webpage for the latest health notices. . If you have some symptoms but not all symptoms, continue to monitor at home and seek medical attention if your symptoms worsen. . If you are having a medical emergency, call 911.   ADDITIONAL HEALTHCARE OPTIONS FOR PATIENTS  [facility] / e-Visit: [email or web address]         [facility]: [phone]  [facility]: [phone]                   [facility]: [phone]   

## 2019-06-09 NOTE — Progress Notes (Signed)
Radiation Oncology         (336) [phone] ________________________________  Name: [name] MRN: [id number]  Date: 06/09/2019  DOB: 09/22/1950  SIMULATION AND TREATMENT PLANNING NOTE    ICD-10-CM   1. Malignant neoplasm of upper-inner quadrant of left breast in female, estrogen receptor positive ([place])  C50.212    Z17.0     DIAGNOSIS: Malignant neoplasm of upper-inner quadrant of left breast,invasive lobular carcinoma,StageIA,pT1c,N0,M0, ER+/PR+, HER2-, GradeII, RS 33  NARRATIVE:  The patient was brought to the [facility].  Identity was confirmed.  All relevant records and images related to the planned course of therapy were reviewed.  The patient freely provided informed written consent to proceed with treatment after reviewing the details related to the planned course of therapy. The consent form was witnessed and verified by the simulation staff.  Then, the patient was set-up in a stable reproducible  supine position for radiation therapy.  CT images were obtained.  Surface markings were placed.  The CT images were loaded into the planning software.  Then the target and avoidance structures were contoured.  Treatment planning then occurred.  The radiation prescription was entered and confirmed.  Then, I designed and supervised the construction of a total of 5 medically necessary complex treatment devices.  I have requested : 3D Simulation  I have requested a DVH of the following structures: Heart, lungs, lumpectomy cavity.  I have ordered:dose calc.  PLAN:  The patient will receive 50.4 Gy in 28 fractions followed by a boost to the lumpectomy cavity of 10 Gray in 5 fractions for a cumulative dose of 60.4 Gray.    Optical Surface Tracking Plan:  Since intensity modulated radiotherapy (IMRT) and 3D conformal radiation treatment methods are predicated on accurate and precise positioning for treatment, intrafraction motion monitoring is medically necessary to ensure  accurate and safe treatment delivery.  The ability to quantify intrafraction motion without excessive ionizing radiation dose can only be performed with optical surface tracking. Accordingly, surface imaging offers the opportunity to obtain 3D measurements of patient position throughout IMRT and 3D treatments without excessive radiation exposure.  I am ordering optical surface tracking for this patient's upcoming course of radiotherapy. ________________________________  Special treatment procedure was performed today due to the extra time and effort required by myself to plan and prepare this patient for deep inspiration breath hold technique.  I have determined cardiac sparing to be of benefit to this patient to prevent long term cardiac damage due to radiation of the heart.  Bellows were placed on the patient's abdomen. To facilitate cardiac sparing, the patient was coached by the radiation therapists on breath hold techniques and breathing practice was performed. Practice waveforms were obtained. The patient was then scanned while maintaining breath hold in the treatment position.  This image was then transferred over to the imaging specialist. The imaging specialist then created a fusion of the free breathing and breath hold scans using the chest wall as the stable structure. I personally reviewed the fusion in axial, coronal and sagittal image planes.  Excellent cardiac sparing was obtained.  I felt the patient is an appropriate candidate for breath hold and the patient will be treated as such.  The image fusion was then reviewed with the patient to reinforce the necessity of reproducible breath hold.   -----------------------------------  Blair Promise, PhD, MD  This document serves as a record of services personally performed by Gery Pray, MD. It was created on his behalf by  Clerance Lav, a trained medical scribe. The creation of this record is based on the scribe's personal observations and  the provider's statements to them. This document has been checked and approved by the attending provider.

## 2019-06-16 DIAGNOSIS — Z510 Encounter for antineoplastic radiation therapy: Secondary | ICD-10-CM | POA: Diagnosis not present

## 2019-06-16 DIAGNOSIS — C50212 Malignant neoplasm of upper-inner quadrant of left female breast: Secondary | ICD-10-CM | POA: Diagnosis not present

## 2019-06-17 ENCOUNTER — Encounter: Payer: Self-pay | Admitting: Sports Medicine

## 2019-06-17 ENCOUNTER — Ambulatory Visit: Payer: PPO | Admitting: Sports Medicine

## 2019-06-17 ENCOUNTER — Ambulatory Visit (INDEPENDENT_AMBULATORY_CARE_PROVIDER_SITE_OTHER): Payer: PPO

## 2019-06-17 ENCOUNTER — Ambulatory Visit
Admission: RE | Admit: 2019-06-17 | Discharge: 2019-06-17 | Disposition: A | Discharge status: Home / Self Care | Payer: PPO | Source: Ambulatory Visit | Attending: Radiation Oncology | Admitting: Radiation Oncology

## 2019-06-17 ENCOUNTER — Other Ambulatory Visit: Payer: Self-pay

## 2019-06-17 VITALS — BP 149/77 | HR 62 | Resp 16

## 2019-06-17 DIAGNOSIS — S9032XA Contusion of left foot, initial encounter: Secondary | ICD-10-CM | POA: Diagnosis not present

## 2019-06-17 DIAGNOSIS — M79672 Pain in left foot: Secondary | ICD-10-CM | POA: Diagnosis not present

## 2019-06-17 DIAGNOSIS — C50212 Malignant neoplasm of upper-inner quadrant of left female breast: Secondary | ICD-10-CM

## 2019-06-17 DIAGNOSIS — S9031XA Contusion of right foot, initial encounter: Secondary | ICD-10-CM

## 2019-06-17 DIAGNOSIS — S93602A Unspecified sprain of left foot, initial encounter: Secondary | ICD-10-CM

## 2019-06-17 DIAGNOSIS — Z510 Encounter for antineoplastic radiation therapy: Secondary | ICD-10-CM | POA: Diagnosis not present

## 2019-06-17 DIAGNOSIS — Z170 Estrogen receptor positive status [ER+]: Secondary | ICD-10-CM

## 2019-06-17 NOTE — Progress Notes (Signed)
  Radiation Oncology         [phone] ________________________________  Name: [name] MRN: [id number]  Date: 06/17/2019  DOB: 04-Mar-1951  Simulation Verification Note    ICD-10-CM   1. Malignant neoplasm of upper-inner quadrant of left breast in female, estrogen receptor positive ([place])  C50.212    Z17.0     Status: outpatient  NARRATIVE: The patient was brought to the treatment unit and placed in the planned treatment position. The clinical setup was verified. Then port films were obtained and uploaded to the radiation oncology medical record software.  The treatment beams were carefully compared against the planned radiation fields. The position location and shape of the radiation fields was reviewed. They targeted volume of tissue appears to be appropriately covered by the radiation beams. Organs at risk appear to be excluded as planned.  Based on my personal review, I approved the simulation verification. The patient's treatment will proceed as planned.  -----------------------------------  Blair Promise, PhD, MD

## 2019-06-17 NOTE — Progress Notes (Signed)
Subjective: [name] is a 68 y.o. female patient who presents to office for evaluation of Left foot pain. Patient complains of progressive pain especially over the last month in the Left foot at the top after stepping in a hole twisting foot. Ranks pain 5/10 and is now interferring with daily activities. Patient has tried icing with no relief in symptoms. Patient denies any other pedal complaints.Admits to history of breast cancer on chemo and starts radiation today at 4pm.  Review of Systems  All other systems reviewed and are negative.    Patient Active Problem List   Diagnosis Date Noted  . Genetic testing 11/14/2018  . Family history of breast cancer   . Family history of colon cancer   . Family history of pancreatic cancer   . Malignant neoplasm of upper-inner quadrant of left breast in female, estrogen receptor positive ([place]) 10/18/2018  . 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 Family history of malignant neoplasm of breast 09/24/2014  . Family history of malignant neoplasm of gastrointestinal tract 09/24/2014  . Breast cancer of upper-inner quadrant of right female breast ([place]) 09/15/2014    Current Outpatient Medications on File Prior to Visit  Medication Sig Dispense Refill  . anastrozole (ARIMIDEX) 1 MG tablet Take 1 tablet (1 mg total) by mouth daily. 30 tablet 3  . Ascorbic Acid (VITAMIN C) 1000 MG tablet Take 2,000 mg by mouth daily.     . Calcium Carb-Cholecalciferol (CALCIUM 600 + D PO) Take 2 tablets by mouth daily.     . cholecalciferol (VITAMIN D3) 25 MCG (1000 UT) tablet Take 1,000 Units by mouth daily.     Marland Kitchen HYDROcodone-acetaminophen (NORCO) 5-325 MG tablet Take 1 tablet by mouth every 6 (six) hours as needed for moderate pain or severe pain. 5 tablet 0  . hydrOXYzine (ATARAX/VISTARIL) 25 MG tablet Take 1-2 tablets (25-50 mg total) by mouth every 6 (six) hours as needed. 30 tablet 0  . Melatonin 10 MG CAPS Take 20 mg by mouth at bedtime as needed  (sleep).     . methimazole (TAPAZOLE) 5 MG tablet Take 5 mg by mouth daily.    . Multiple Vitamin (MULTIVITAMIN WITH MINERALS) TABS tablet Take 1 tablet by mouth daily.     . Naproxen Sod-diphenhydrAMINE (ALEVE PM) 220-25 MG TABS Take 2 tablets by mouth at bedtime as needed (sleep).    . naproxen sodium (ALEVE) 220 MG tablet Take 440 mg by mouth 2 (two) times daily as needed (pain).    Marland Kitchen sertraline (ZOLOFT) 100 MG tablet Take 200 mg by mouth daily.     . simvastatin (ZOCOR) 40 MG tablet Take 40 mg by mouth every evening.    . vitamin E 100 UNIT capsule Take 200 Units by mouth daily.      No current facility-administered medications on file prior to visit.     Allergies  Allergen Reactions  . Docetaxel     SOB, chest pressure, dizziness    Objective:  General: Alert and oriented x3 in no acute distress  Dermatology: No open lesions bilateral lower extremities, no webspace macerations, no ecchymosis bilateral, all nails x 10 are well manicured.  Vascular: Dorsalis Pedis and Posterior Tibial pedal pulses palpable, Capillary Fill Time 3 seconds,(+) pedal hair growth bilateral, no edema bilateral lower extremities, Temperature gradient within normal limits.  Neurology: Johney Maine sensation intact via light touch bilateral.   Musculoskeletal: Mild tenderness with palpation at dorsal midfoot with localized swelling. No pain with calf compression  bilateral. There is diffuse midfoot pain to palpation.  Strength within normal limits in all groups bilateral.   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  Assessment and Plan: Problem List Items Addressed This Visit    None    Visit Diagnoses    Left foot pain    -  Primary   Relevant Orders   DG Foot Complete Left   Sprain of left foot, initial encounter       Contusion of left foot, initial encounter            -Complete examination performed -Xrays reviewed -Discussed treatment options for foot sprain versus contusion injury -Applied Unna boot to keep intact for 5 days after removal advised patient to use compression sleeve and continue with postoperative shoe -Recommend rest ice elevation -Patient to return to office in 2 weeks or sooner if condition worsens.  Continues to be painful may benefit from MRI for further evaluation of Lisfranc joint and midtarsal joint.  Landis Martins, DPM

## 2019-06-18 ENCOUNTER — Other Ambulatory Visit: Payer: Self-pay | Admitting: Sports Medicine

## 2019-06-18 ENCOUNTER — Other Ambulatory Visit: Payer: Self-pay

## 2019-06-18 ENCOUNTER — Ambulatory Visit
Admission: RE | Admit: 2019-06-18 | Discharge: 2019-06-18 | Disposition: A | Discharge status: Home / Self Care | Payer: PPO | Source: Ambulatory Visit | Attending: Radiation Oncology | Admitting: Radiation Oncology

## 2019-06-18 DIAGNOSIS — C50212 Malignant neoplasm of upper-inner quadrant of left female breast: Secondary | ICD-10-CM | POA: Diagnosis not present

## 2019-06-18 DIAGNOSIS — S9031XA Contusion of right foot, initial encounter: Secondary | ICD-10-CM

## 2019-06-18 DIAGNOSIS — S93602A Unspecified sprain of left foot, initial encounter: Secondary | ICD-10-CM

## 2019-06-18 DIAGNOSIS — Z510 Encounter for antineoplastic radiation therapy: Secondary | ICD-10-CM | POA: Diagnosis not present

## 2019-06-19 ENCOUNTER — Ambulatory Visit
Admission: RE | Admit: 2019-06-19 | Discharge: 2019-06-19 | Disposition: A | Discharge status: Home / Self Care | Payer: PPO | Source: Ambulatory Visit | Attending: Radiation Oncology | Admitting: Radiation Oncology

## 2019-06-19 ENCOUNTER — Other Ambulatory Visit: Payer: Self-pay

## 2019-06-19 DIAGNOSIS — Z510 Encounter for antineoplastic radiation therapy: Secondary | ICD-10-CM | POA: Diagnosis not present

## 2019-06-19 DIAGNOSIS — C50212 Malignant neoplasm of upper-inner quadrant of left female breast: Secondary | ICD-10-CM | POA: Diagnosis not present

## 2019-06-20 ENCOUNTER — Ambulatory Visit
Admission: RE | Admit: 2019-06-20 | Discharge: 2019-06-20 | Disposition: A | Discharge status: Home / Self Care | Payer: PPO | Source: Ambulatory Visit | Attending: Radiation Oncology | Admitting: Radiation Oncology

## 2019-06-20 ENCOUNTER — Other Ambulatory Visit: Payer: Self-pay

## 2019-06-20 DIAGNOSIS — C50212 Malignant neoplasm of upper-inner quadrant of left female breast: Secondary | ICD-10-CM | POA: Diagnosis not present

## 2019-06-20 DIAGNOSIS — Z510 Encounter for antineoplastic radiation therapy: Secondary | ICD-10-CM | POA: Diagnosis not present

## 2019-06-23 ENCOUNTER — Other Ambulatory Visit: Payer: Self-pay

## 2019-06-23 ENCOUNTER — Ambulatory Visit
Admission: RE | Admit: 2019-06-23 | Discharge: 2019-06-23 | Disposition: A | Discharge status: Home / Self Care | Payer: PPO | Source: Ambulatory Visit | Attending: Radiation Oncology | Admitting: Radiation Oncology

## 2019-06-23 DIAGNOSIS — Z510 Encounter for antineoplastic radiation therapy: Secondary | ICD-10-CM | POA: Diagnosis not present

## 2019-06-23 DIAGNOSIS — C50212 Malignant neoplasm of upper-inner quadrant of left female breast: Secondary | ICD-10-CM | POA: Diagnosis not present

## 2019-06-24 ENCOUNTER — Other Ambulatory Visit: Payer: Self-pay

## 2019-06-24 ENCOUNTER — Ambulatory Visit
Admission: RE | Admit: 2019-06-24 | Discharge: 2019-06-24 | Disposition: A | Discharge status: Home / Self Care | Payer: PPO | Source: Ambulatory Visit | Attending: Radiation Oncology | Admitting: Radiation Oncology

## 2019-06-24 DIAGNOSIS — C50212 Malignant neoplasm of upper-inner quadrant of left female breast: Secondary | ICD-10-CM | POA: Diagnosis not present

## 2019-06-24 DIAGNOSIS — Z510 Encounter for antineoplastic radiation therapy: Secondary | ICD-10-CM | POA: Diagnosis not present

## 2019-06-25 ENCOUNTER — Other Ambulatory Visit: Payer: Self-pay

## 2019-06-25 ENCOUNTER — Ambulatory Visit
Admission: RE | Admit: 2019-06-25 | Discharge: 2019-06-25 | Disposition: A | Discharge status: Home / Self Care | Payer: PPO | Source: Ambulatory Visit | Attending: Radiation Oncology | Admitting: Radiation Oncology

## 2019-06-25 DIAGNOSIS — Z510 Encounter for antineoplastic radiation therapy: Secondary | ICD-10-CM | POA: Diagnosis not present

## 2019-06-25 DIAGNOSIS — C50212 Malignant neoplasm of upper-inner quadrant of left female breast: Secondary | ICD-10-CM | POA: Diagnosis not present

## 2019-06-26 ENCOUNTER — Other Ambulatory Visit: Payer: Self-pay

## 2019-06-26 ENCOUNTER — Ambulatory Visit
Admission: RE | Admit: 2019-06-26 | Discharge: 2019-06-26 | Disposition: A | Discharge status: Home / Self Care | Payer: PPO | Source: Ambulatory Visit | Attending: Radiation Oncology | Admitting: Radiation Oncology

## 2019-06-26 DIAGNOSIS — C50212 Malignant neoplasm of upper-inner quadrant of left female breast: Secondary | ICD-10-CM | POA: Diagnosis not present

## 2019-06-26 DIAGNOSIS — Z510 Encounter for antineoplastic radiation therapy: Secondary | ICD-10-CM | POA: Diagnosis not present

## 2019-06-27 ENCOUNTER — Ambulatory Visit
Admission: RE | Admit: 2019-06-27 | Discharge: 2019-06-27 | Disposition: A | Discharge status: Home / Self Care | Payer: PPO | Source: Ambulatory Visit | Attending: Radiation Oncology | Admitting: Radiation Oncology

## 2019-06-27 ENCOUNTER — Other Ambulatory Visit: Payer: Self-pay

## 2019-06-27 DIAGNOSIS — C50212 Malignant neoplasm of upper-inner quadrant of left female breast: Secondary | ICD-10-CM | POA: Diagnosis not present

## 2019-06-27 DIAGNOSIS — Z510 Encounter for antineoplastic radiation therapy: Secondary | ICD-10-CM | POA: Diagnosis not present

## 2019-06-29 ENCOUNTER — Other Ambulatory Visit: Payer: Self-pay

## 2019-06-29 ENCOUNTER — Ambulatory Visit
Admission: RE | Admit: 2019-06-29 | Discharge: 2019-06-29 | Disposition: A | Discharge status: Home / Self Care | Payer: PPO | Source: Ambulatory Visit | Attending: Radiation Oncology | Admitting: Radiation Oncology

## 2019-06-29 DIAGNOSIS — C50212 Malignant neoplasm of upper-inner quadrant of left female breast: Secondary | ICD-10-CM | POA: Diagnosis not present

## 2019-06-29 DIAGNOSIS — Z510 Encounter for antineoplastic radiation therapy: Secondary | ICD-10-CM | POA: Diagnosis not present

## 2019-06-30 ENCOUNTER — Ambulatory Visit
Admission: RE | Admit: 2019-06-30 | Discharge: 2019-06-30 | Disposition: A | Discharge status: Home / Self Care | Payer: PPO | Source: Ambulatory Visit | Attending: Radiation Oncology | Admitting: Radiation Oncology

## 2019-06-30 ENCOUNTER — Other Ambulatory Visit: Payer: Self-pay

## 2019-06-30 DIAGNOSIS — C50212 Malignant neoplasm of upper-inner quadrant of left female breast: Secondary | ICD-10-CM | POA: Diagnosis not present

## 2019-06-30 DIAGNOSIS — Z510 Encounter for antineoplastic radiation therapy: Secondary | ICD-10-CM | POA: Diagnosis not present

## 2019-07-01 ENCOUNTER — Ambulatory Visit
Admission: RE | Admit: 2019-07-01 | Discharge: 2019-07-01 | Disposition: A | Discharge status: Home / Self Care | Payer: PPO | Source: Ambulatory Visit | Attending: Radiation Oncology | Admitting: Radiation Oncology

## 2019-07-01 ENCOUNTER — Other Ambulatory Visit: Payer: Self-pay

## 2019-07-01 ENCOUNTER — Ambulatory Visit: Payer: PPO | Admitting: Sports Medicine

## 2019-07-01 DIAGNOSIS — C50212 Malignant neoplasm of upper-inner quadrant of left female breast: Secondary | ICD-10-CM | POA: Diagnosis not present

## 2019-07-01 DIAGNOSIS — Z510 Encounter for antineoplastic radiation therapy: Secondary | ICD-10-CM | POA: Diagnosis not present

## 2019-07-02 ENCOUNTER — Other Ambulatory Visit: Payer: Self-pay

## 2019-07-02 ENCOUNTER — Ambulatory Visit
Admission: RE | Admit: 2019-07-02 | Discharge: 2019-07-02 | Disposition: A | Discharge status: Home / Self Care | Payer: PPO | Source: Ambulatory Visit | Attending: Radiation Oncology | Admitting: Radiation Oncology

## 2019-07-02 DIAGNOSIS — C50212 Malignant neoplasm of upper-inner quadrant of left female breast: Secondary | ICD-10-CM | POA: Diagnosis not present

## 2019-07-02 DIAGNOSIS — Z510 Encounter for antineoplastic radiation therapy: Secondary | ICD-10-CM | POA: Diagnosis not present

## 2019-07-07 ENCOUNTER — Ambulatory Visit
Admission: RE | Admit: 2019-07-07 | Discharge: 2019-07-07 | Disposition: A | Discharge status: Home / Self Care | Payer: PPO | Source: Ambulatory Visit | Attending: Radiation Oncology | Admitting: Radiation Oncology

## 2019-07-07 ENCOUNTER — Other Ambulatory Visit: Payer: Self-pay

## 2019-07-07 DIAGNOSIS — C50212 Malignant neoplasm of upper-inner quadrant of left female breast: Secondary | ICD-10-CM | POA: Diagnosis not present

## 2019-07-07 DIAGNOSIS — Z510 Encounter for antineoplastic radiation therapy: Secondary | ICD-10-CM | POA: Diagnosis not present

## 2019-07-08 ENCOUNTER — Other Ambulatory Visit: Payer: Self-pay

## 2019-07-08 ENCOUNTER — Ambulatory Visit: Payer: PPO | Admitting: Sports Medicine

## 2019-07-08 ENCOUNTER — Ambulatory Visit
Admission: RE | Admit: 2019-07-08 | Discharge: 2019-07-08 | Disposition: A | Discharge status: Home / Self Care | Payer: PPO | Source: Ambulatory Visit | Attending: Radiation Oncology | Admitting: Radiation Oncology

## 2019-07-08 DIAGNOSIS — Z171 Estrogen receptor negative status [ER-]: Secondary | ICD-10-CM

## 2019-07-08 DIAGNOSIS — C50212 Malignant neoplasm of upper-inner quadrant of left female breast: Secondary | ICD-10-CM | POA: Diagnosis not present

## 2019-07-08 DIAGNOSIS — Z170 Estrogen receptor positive status [ER+]: Secondary | ICD-10-CM | POA: Diagnosis not present

## 2019-07-08 DIAGNOSIS — C50211 Malignant neoplasm of upper-inner quadrant of right female breast: Secondary | ICD-10-CM

## 2019-07-08 DIAGNOSIS — Z510 Encounter for antineoplastic radiation therapy: Secondary | ICD-10-CM | POA: Insufficient documentation

## 2019-07-08 MED ORDER — SONAFINE EX EMUL
1.0000 "application " | Freq: Two times a day (BID) | CUTANEOUS | Status: DC
  Administered 2019-07-08: 1 via TOPICAL

## 2019-07-09 ENCOUNTER — Other Ambulatory Visit: Payer: Self-pay

## 2019-07-09 ENCOUNTER — Ambulatory Visit
Admission: RE | Admit: 2019-07-09 | Discharge: 2019-07-09 | Disposition: A | Discharge status: Home / Self Care | Payer: PPO | Source: Ambulatory Visit | Attending: Radiation Oncology | Admitting: Radiation Oncology

## 2019-07-09 DIAGNOSIS — C50212 Malignant neoplasm of upper-inner quadrant of left female breast: Secondary | ICD-10-CM | POA: Diagnosis not present

## 2019-07-09 DIAGNOSIS — Z510 Encounter for antineoplastic radiation therapy: Secondary | ICD-10-CM | POA: Diagnosis not present

## 2019-07-10 ENCOUNTER — Other Ambulatory Visit: Payer: Self-pay

## 2019-07-10 ENCOUNTER — Ambulatory Visit
Admission: RE | Admit: 2019-07-10 | Discharge: 2019-07-10 | Disposition: A | Discharge status: Home / Self Care | Payer: PPO | Source: Ambulatory Visit | Attending: Radiation Oncology | Admitting: Radiation Oncology

## 2019-07-10 DIAGNOSIS — C50212 Malignant neoplasm of upper-inner quadrant of left female breast: Secondary | ICD-10-CM | POA: Diagnosis not present

## 2019-07-10 DIAGNOSIS — Z510 Encounter for antineoplastic radiation therapy: Secondary | ICD-10-CM | POA: Diagnosis not present

## 2019-07-11 ENCOUNTER — Ambulatory Visit: Payer: PPO

## 2019-07-11 ENCOUNTER — Other Ambulatory Visit: Payer: Self-pay

## 2019-07-11 ENCOUNTER — Telehealth: Payer: Self-pay | Admitting: Radiation Oncology

## 2019-07-11 NOTE — Telephone Encounter (Signed)
   Received above message from treatment team. Phoned patient at home. Strongly encouraged her to be tested for COVID asap. Explained if her test returns negative she can resume radiation therapy. Stressed it is important to be tested asap to avoid radiation delays that could effect her treatment. Discussed testing locations. Patient verbalized understanding and expressed appreciation for the call.

## 2019-07-12 ENCOUNTER — Other Ambulatory Visit: Payer: Self-pay

## 2019-07-12 ENCOUNTER — Ambulatory Visit
Admission: EM | Admit: 2019-07-12 | Discharge: 2019-07-12 | Disposition: A | Discharge status: Home / Self Care | Payer: PPO | Attending: Emergency Medicine | Admitting: Emergency Medicine

## 2019-07-12 ENCOUNTER — Encounter: Payer: Self-pay | Admitting: Emergency Medicine

## 2019-07-12 DIAGNOSIS — Z20828 Contact with and (suspected) exposure to other viral communicable diseases: Secondary | ICD-10-CM | POA: Diagnosis not present

## 2019-07-12 DIAGNOSIS — Z20822 Contact with and (suspected) exposure to covid-19: Secondary | ICD-10-CM

## 2019-07-12 NOTE — ED Triage Notes (Signed)
Pt presents to [facility] for assessment after being exposed to a COVID positive patient MOnday of this week, and multiple times since then as well.  Denies any symptoms, patient is currently undergoing radiation for breast cancer and cannot return until test results.

## 2019-07-12 NOTE — Discharge Instructions (Signed)
Your COVID test is pending - it is important to quarantine / isolate at home until your results are back. °If you test positive and would like further evaluation for persistent or worsening symptoms, you may schedule an E-visit or virtual (video) visit throughout the [place] MyChart app or website. ° °PLEASE NOTE: If you develop severe chest pain or shortness of breath please go to the ER or call 9-1-1 for further evaluation --> DO NOT schedule electronic or virtual visits for this. °Please call our office for further guidance / recommendations as needed. °

## 2019-07-12 NOTE — ED Provider Notes (Signed)
[facility]    CSN: [id number] Arrival date & time: 07/12/19  1110      History   Chief Complaint Chief Complaint  Patient presents with  . COVID Exposure    HPI [name] is a 68 y.o. female   Presenting for Covid testing: Exposure: Patient in treatment center Date of exposure: Monday, had repeat exposures at the week while at treatment Any fever, symptoms since exposure: No Radiation therapist requesting PCR testing prior to returning to treatment.  Past Medical History:  Diagnosis Date  . Allergy   . Breast cancer ([place]) 09/11/14   right breast  . Breast cancer in female [facility]) 01/2019  . Breast cancer of upper-inner quadrant of right female breast ([place]) 09/15/2014  . Family history of breast cancer   . Family history of colon cancer   . Family history of pancreatic cancer   . Hot flashes   . Hyperlipidemia   . Hypothyroidism   . Personal history of radiation therapy 2016  . Radiation 11/25/14-01/06/15   Right Breast    Patient Active Problem List   Diagnosis Date Noted  . Genetic testing 11/14/2018  . Family history of breast cancer   . Family history of colon cancer   . Family history of pancreatic cancer   . Malignant neoplasm of upper-inner quadrant of left breast in female, estrogen receptor positive ([place]) 10/18/2018  . 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 Family history of malignant neoplasm of breast 09/24/2014  . Family history of malignant neoplasm of gastrointestinal tract 09/24/2014  . Breast cancer of upper-inner quadrant of right female breast ([place]) 09/15/2014    Past Surgical History:  Procedure Laterality Date  . AUGMENTATION MAMMAPLASTY Bilateral 03/10/1997  . BREAST BIOPSY Right 09/11/2014  . BREAST LUMPECTOMY Right 10/20/2014  . BREAST LUMPECTOMY WITH RADIOACTIVE SEED AND SENTINEL LYMPH NODE BIOPSY N/A 01/07/2019   Procedure: LEFT BREAST LUMPECTOMY WITH RADIOACTIVE SEED AND LEFT DEEP AXILLARY SENTINEL LYMPH NODE  BIOPSY WITH BLUE DYE INJECTION;  Surgeon: Fanny Skates, MD;  Location: [place];  Service: General;  Laterality: N/A;  . COLONOSCOPY    . DILATION AND CURETTAGE OF UTERUS    . KNEE ARTHROSCOPY    . PLACEMENT OF BREAST IMPLANTS  1995   bilat breast implants  . TONSILLECTOMY    . TUBAL LIGATION    . WISDOM TOOTH EXTRACTION      OB History   No obstetric history on file.      Home Medications    Prior to Admission medications   Medication Sig Start Date End Date Taking? Authorizing Provider  anastrozole (ARIMIDEX) 1 MG tablet Take 1 tablet (1 mg total) by mouth daily. 11/07/18   [name], MD  Ascorbic Acid (VITAMIN C) 1000 MG tablet Take 2,000 mg by mouth daily.     [provider]  Calcium Carb-Cholecalciferol (CALCIUM 600 + D PO) Take 2 tablets by mouth daily.     [provider]  cholecalciferol (VITAMIN D3) 25 MCG (1000 UT) tablet Take 1,000 Units by mouth daily.     [provider]  HYDROcodone-acetaminophen (NORCO) 5-325 MG tablet Take 1 tablet by mouth every 6 (six) hours as needed for moderate pain or severe pain. 04/28/19   Alla Feeling, NP  hydrOXYzine (ATARAX/VISTARIL) 25 MG tablet Take 1-2 tablets (25-50 mg total) by mouth every 6 (six) hours as needed. 04/12/19   Dorie Rank, MD  Melatonin 10 MG CAPS Take 20 mg by mouth  at bedtime as needed (sleep).     [provider]  methimazole (TAPAZOLE) 5 MG tablet Take 5 mg by mouth daily. 03/19/19   [provider]  Multiple Vitamin (MULTIVITAMIN WITH MINERALS) TABS tablet Take 1 tablet by mouth daily.     [provider]  Naproxen Sod-diphenhydrAMINE (ALEVE PM) 220-25 MG TABS Take 2 tablets by mouth at bedtime as needed (sleep).    [provider]  naproxen sodium (ALEVE) 220 MG tablet Take 440 mg by mouth 2 (two) times daily as needed (pain).    [provider]  sertraline (ZOLOFT) 100 MG tablet Take 200 mg by mouth daily.  09/03/13   [provider]   simvastatin (ZOCOR) 40 MG tablet Take 40 mg by mouth every evening.    [provider]  vitamin E 100 UNIT capsule Take 200 Units by mouth daily.     [provider]    Family History Family History  Problem Relation Age of Onset  . Breast cancer Father 7  . Prostate cancer Father 28  . Throat cancer Father 29  . Cancer Father   . Breast cancer Mother        in early 65's  . Pancreatic cancer Maternal Uncle 75  . Cancer Maternal Uncle   . Pancreatic cancer Maternal Grandmother 57  . Cancer Maternal Grandmother   . Cancer Maternal Grandfather 15       leukemia  . Alcohol abuse Brother   . Alcohol abuse Paternal Uncle   . Lung cancer Maternal Uncle   . Colon cancer Maternal Uncle   . Colon polyps Neg Hx   . Esophageal cancer Neg Hx   . Rectal cancer Neg Hx   . Stomach cancer Neg Hx     Social History Social History   Tobacco Use  . Smoking status: Never Smoker  . Smokeless tobacco: Never Used  Substance Use Topics  . Alcohol use: Yes    Comment: occasional  . Drug use: No     Allergies   Docetaxel   Review of Systems Review of Systems  Constitutional: Negative for fatigue and fever.  HENT: Negative for ear pain, sinus pain, sore throat and voice change.   Eyes: Negative for pain, redness and visual disturbance.  Respiratory: Negative for cough and shortness of breath.   Cardiovascular: Negative for chest pain and palpitations.  Gastrointestinal: Negative for abdominal pain, diarrhea and vomiting.  Musculoskeletal: Negative for arthralgias and myalgias.  Skin: Negative for rash and wound.  Neurological: Negative for syncope and headaches.     Physical Exam Triage Vital Signs ED Triage Vitals  Enc Vitals Group     BP 07/12/19 1118 (!) 151/87     Pulse Rate 07/12/19 1118 76     Resp 07/12/19 1118 18     Temp 07/12/19 1118 97.8 F (36.6 C)     Temp Source 07/12/19 1118 Temporal     SpO2 07/12/19 1118 95 %     Weight --      Height  --      Head Circumference --      Peak Flow --      Pain Score 07/12/19 1119 0     Pain Loc --      Pain Edu? --      Excl. in Graf? --    No data found.  Updated Vital Signs BP (!) 151/87 (BP Location: Left Arm)   Pulse 76   Temp 97.8 F (  36.6 C) (Temporal)   Resp 18   SpO2 95%   Visual Acuity Right Eye Distance:   Left Eye Distance:   Bilateral Distance:    Right Eye Near:   Left Eye Near:    Bilateral Near:     Physical Exam Constitutional:      General: She is not in acute distress. HENT:     Head: Normocephalic and atraumatic.  Eyes:     General: No scleral icterus.    Pupils: Pupils are equal, round, and reactive to light.  Cardiovascular:     Rate and Rhythm: Normal rate.  Pulmonary:     Effort: Pulmonary effort is normal.  Skin:    Coloration: Skin is not jaundiced or pale.  Neurological:     Mental Status: She is alert and oriented to person, place, and time.      UC Treatments / Results  Labs (all labs ordered are listed, but only abnormal results are displayed) Labs Reviewed  NOVEL CORONAVIRUS, NAA    EKG   Radiology No results found.  Procedures Procedures (including critical care time)  Medications Ordered in UC Medications - No data to display  Initial Impression / Assessment and Plan / UC Course  I have reviewed the triage vital signs and the nursing notes.  Pertinent labs & imaging results that were available during my care of the patient were reviewed by me and considered in my medical decision making (see chart for details).     Patient afebrile, nontoxic, and without symptoms status post exposure earlier this week.  Covid PCR obtained in office which patient tolerated well: Patient quarantine and communicate with radiation therapist regarding PCR results.  Return precautions discussed, patient verbalized understanding and is agreeable to plan. Final Clinical Impressions(s) / UC Diagnoses   Final diagnoses:  Exposure to  COVID-19 virus     Discharge Instructions     Your COVID test is pending - it is important to quarantine / isolate at home until your results are back. If you test positive and would like further evaluation for persistent or worsening symptoms, you may schedule an E-visit or virtual (video) visit throughout the [facility] app or website.  PLEASE NOTE: If you develop severe chest pain or shortness of breath please go to the ER or call 9-1-1 for further evaluation --> DO NOT schedule electronic or virtual visits for this. Please call our office for further guidance / recommendations as needed.    ED Prescriptions    None     PDMP not reviewed this encounter.   [place], [place], [place] 07/12/19 1133

## 2019-07-14 ENCOUNTER — Inpatient Hospital Stay: Payer: PPO | Admitting: Hematology

## 2019-07-14 ENCOUNTER — Ambulatory Visit: Payer: PPO

## 2019-07-14 ENCOUNTER — Telehealth: Payer: Self-pay

## 2019-07-14 ENCOUNTER — Telehealth: Payer: Self-pay | Admitting: Hematology

## 2019-07-14 ENCOUNTER — Inpatient Hospital Stay: Payer: PPO

## 2019-07-14 NOTE — Telephone Encounter (Signed)
Patient calls stating that she is still waiting on Covid test results will not be able to come for RT and appointments with Dr. Burr Medico, called RT to cancel and sent scheduling message to r/s her appointments from today.

## 2019-07-14 NOTE — Telephone Encounter (Signed)
R/s appt per 12/7 sch message - pt is aware of  New appt date and time

## 2019-07-15 ENCOUNTER — Encounter: Payer: Self-pay | Admitting: *Deleted

## 2019-07-15 ENCOUNTER — Ambulatory Visit
Admission: RE | Admit: 2019-07-15 | Discharge: 2019-07-15 | Disposition: A | Discharge status: Home / Self Care | Payer: PPO | Source: Ambulatory Visit | Attending: Radiation Oncology | Admitting: Radiation Oncology

## 2019-07-15 ENCOUNTER — Other Ambulatory Visit: Payer: Self-pay

## 2019-07-15 ENCOUNTER — Telehealth: Payer: Self-pay | Admitting: *Deleted

## 2019-07-15 DIAGNOSIS — Z510 Encounter for antineoplastic radiation therapy: Secondary | ICD-10-CM | POA: Diagnosis not present

## 2019-07-15 DIAGNOSIS — C50212 Malignant neoplasm of upper-inner quadrant of left female breast: Secondary | ICD-10-CM | POA: Diagnosis not present

## 2019-07-15 LAB — NOVEL CORONAVIRUS, NAA: SARS-CoV-2, NAA: NOT DETECTED

## 2019-07-15 NOTE — Telephone Encounter (Signed)
Received vm call from pt stating that her Covid test was negative & she will be coming in for radiation this pm.  Notified Sharee Pimple in radiation dept.

## 2019-07-16 ENCOUNTER — Other Ambulatory Visit: Payer: Self-pay

## 2019-07-16 ENCOUNTER — Ambulatory Visit
Admission: RE | Admit: 2019-07-16 | Discharge: 2019-07-16 | Disposition: A | Discharge status: Home / Self Care | Payer: PPO | Source: Ambulatory Visit | Attending: Radiation Oncology | Admitting: Radiation Oncology

## 2019-07-16 DIAGNOSIS — Z510 Encounter for antineoplastic radiation therapy: Secondary | ICD-10-CM | POA: Diagnosis not present

## 2019-07-16 DIAGNOSIS — C50212 Malignant neoplasm of upper-inner quadrant of left female breast: Secondary | ICD-10-CM | POA: Diagnosis not present

## 2019-07-17 ENCOUNTER — Ambulatory Visit
Admission: RE | Admit: 2019-07-17 | Discharge: 2019-07-17 | Disposition: A | Discharge status: Home / Self Care | Payer: PPO | Source: Ambulatory Visit | Attending: Radiation Oncology | Admitting: Radiation Oncology

## 2019-07-17 ENCOUNTER — Other Ambulatory Visit: Payer: Self-pay

## 2019-07-17 DIAGNOSIS — Z510 Encounter for antineoplastic radiation therapy: Secondary | ICD-10-CM | POA: Diagnosis not present

## 2019-07-17 DIAGNOSIS — C50212 Malignant neoplasm of upper-inner quadrant of left female breast: Secondary | ICD-10-CM | POA: Diagnosis not present

## 2019-07-17 DIAGNOSIS — E785 Hyperlipidemia, unspecified: Secondary | ICD-10-CM | POA: Diagnosis not present

## 2019-07-18 ENCOUNTER — Ambulatory Visit
Admission: RE | Admit: 2019-07-18 | Discharge: 2019-07-18 | Disposition: A | Discharge status: Home / Self Care | Payer: PPO | Source: Ambulatory Visit | Attending: Radiation Oncology | Admitting: Radiation Oncology

## 2019-07-18 ENCOUNTER — Other Ambulatory Visit: Payer: Self-pay

## 2019-07-18 DIAGNOSIS — Z510 Encounter for antineoplastic radiation therapy: Secondary | ICD-10-CM | POA: Diagnosis not present

## 2019-07-18 DIAGNOSIS — C50212 Malignant neoplasm of upper-inner quadrant of left female breast: Secondary | ICD-10-CM | POA: Diagnosis not present

## 2019-07-21 ENCOUNTER — Other Ambulatory Visit: Payer: Self-pay

## 2019-07-21 ENCOUNTER — Ambulatory Visit
Admission: RE | Admit: 2019-07-21 | Discharge: 2019-07-21 | Disposition: A | Discharge status: Home / Self Care | Payer: PPO | Source: Ambulatory Visit | Attending: Radiation Oncology | Admitting: Radiation Oncology

## 2019-07-21 DIAGNOSIS — C50212 Malignant neoplasm of upper-inner quadrant of left female breast: Secondary | ICD-10-CM | POA: Diagnosis not present

## 2019-07-21 DIAGNOSIS — Z510 Encounter for antineoplastic radiation therapy: Secondary | ICD-10-CM | POA: Diagnosis not present

## 2019-07-21 NOTE — Progress Notes (Signed)
[place]   Telephone:([phone]) [phone] [phone]) [phone]   Clinic Follow up Note   Patient Care Team: [name], DO as PCP - General (Family Medicine) Erroll Luna, MD as Consulting Physician (General Surgery) [name], MD as Consulting Physician (Hematology) Thea Silversmith, MD as Consulting Physician (Radiation Oncology) Rockwell Germany, RN as Registered Nurse Mauro Kaufmann, RN as Registered Nurse Holley Bouche, NP (Inactive) as Nurse Practitioner (Nurse Practitioner) Sylvan Cheese, NP as Nurse Practitioner (Nurse Practitioner) Madelin Rear, MD as Consulting Physician (Endocrinology)  Date of Service:  07/22/2019  CHIEF COMPLAINT: F/u of breast cancer  SUMMARY OF ONCOLOGIC HISTORY: Oncology History Overview Note  Cancer Staging Breast cancer of upper-inner quadrant of right female breast [facility]) Staging form: Breast, AJCC 7th Edition - Clinical stage from 09/23/2014: Stage 0 (Tis (DCIS), N0, M0) - Unsigned - Pathologic stage from 10/22/2014: Stage 0 (Tis (DCIS), N0, cM0) - Signed by [name], MD on 11/03/2014 Staging comments: Staged on final lumpectomy specimen by Dr. [name]  Malignant neoplasm of upper-inner quadrant of left breast in female, estrogen receptor positive ([place]) Staging form: Breast, AJCC 8th Edition - Clinical: cT1b, cN0, cM0, G2, ER+, PR+ - Unsigned    Breast cancer of upper-inner quadrant of right female breast ([place])  09/11/2014 Mammogram   Right breast: area of pleomorphic calcifications and associated architectural distortion, with irregular density at the 1 o'clock position of the right breast. No other suspicious findings in the right breast.    09/11/2014 Breast US   Right breast: area of hypoechogenecity 2 cm x 0.9 cm x 1.6 cm in size, 12 o'clock position, 3 cm the nipple. No discrete suspicious mass.   09/11/2014 Initial Biopsy   Right breast needle core bx (11-12 o'clock, UOQ): DCIS, grade 3, with necrosis  and calcifications, ER- (0%), PR- (0%)   09/11/2014 Pathologic Stage   Stage 0: Tis Nx    09/17/2014 Breast MRI   Non mass enhancement in the upper and slight outer right breast measuring approximately 2.6 x 1.7 x 2.2 cm. Biopsy proven fibrocystic changes in the upper outer periareolar right breast manifested as a 9 x 7 mm mass.   09/24/2014 Procedure   Genetic testing: BRCA plus and BreastNext panels ([place]) performed showing no clinically significant variants detected including ATM, BARD1, BRCA1, BRCA2, BRIP1, CDH1, CHEK2, MRE11A, MUTYH, NBN, NF1, PALB2, PTEN, RAD50, RAD51C, RAD51D, and TP53.   10/20/2014 Definitive Surgery   Right lumpectomy with SLNB ([place]): DCIS with necrosis and calcifications, grade 3, 1 mm from nearest margin (posterior), 0/2 LN with evidence of malignancy.   10/20/2014 Clinical Stage   Stage 0: Tis N0   11/25/2014 - 01/06/2015 Radiation Therapy   Adjuvant RT completed [name]): Right breast 50 Gy over 25 fractions; right breast boost 10 Gy over 5 fractions. Total dose received: 60 Gy.    04/13/2015 Survivorship   Survivorship care plan completed and copy mailed to patient as she was unable to be seen in the Survivorship clinic due to patient's schedule.   11/12/2018 Genetic Testing   Negative genetic testing on the common hereditary cancer panel.  The Hereditary Gene Panel offered by [facility] includes sequencing and/or deletion duplication testing of the following 48 genes: APC, ATM, AXIN2, BARD1, BMPR1A, BRCA1, BRCA2, BRIP1, CDH1, CDK4, CDKN2A (p14ARF), CDKN2A (p16INK4a), CHEK2, CTNNA1, DICER1, EPCAM (Deletion/duplication testing only), GREM1 (promoter region deletion/duplication testing only), KIT, MEN1, MLH1, MSH2, MSH3, MSH6, MUTYH, NBN, NF1, NHTL1, PALB2, PDGFRA, PMS2, POLD1, POLE, PTEN, RAD50, RAD51C, RAD51D,  RNF43, SDHB, SDHC, SDHD, SMAD4, SMARCA4. STK11, TP53, TSC1, TSC2, and VHL.  The following genes were evaluated for sequence changes only: SDHA and HOXB13 c.251G>A  variant only. The report date is 11/12/2018.  AXIN2 c.815+fdup ([employer]) VUS identified.  This is still a normal result and will not change medical management.   Malignant neoplasm of upper-inner quadrant of left breast in female, estrogen receptor positive ([place])  10/18/2018 Initial Diagnosis   Malignant neoplasm of upper-inner quadrant of left breast in female, estrogen receptor positive ([place])   10/18/2018 Initial Biopsy   Diagnosis Breast, left, needle core biopsy, 10 o'clock, 4 cm fn - INVASIVE MAMMARY CARCINOMA - MAMMARY CARCINOMA IN SITU - SEE COMMENT   10/18/2018 Receptors her2   ER: 100% with strong staining  PR: 80% with strong staining  HER2 Negative    10/18/2018 Imaging   Diagnostic Mammogram 10/18/18  IMPRESSION 2 hypoechoic masses in the left breast at 10 o'clock, 4 cm from the nipple. The larger mass measures 5 x 6 x 5 mm. The second mass measures 3 x 5 x 5 mm. The masses are 7 mm apart and span a total dimension 1.7 cm. No axillary adenopathy on the left.   10/18/2018 Cancer Staging   Staging form: Breast, AJCC 8th Edition - Clinical stage from 10/18/2018: Stage IA (cT1b, cN0, cM0, G2, ER+, PR+, HER2-) - Signed by [name], MD on 11/07/2018   10/30/2018 Breast MRI   Breast MRI 10/30/18  IMPRESSION: Biopsy-proven malignancy within the left breast 10 o'clock position. No additional suspicious areas of enhancement identified within either breast.   11/2018 -  Anti-estrogen oral therapy   Anastrozole '1mg'$  daily starting 11/2018 (before surgery). Held after 01/27/19 for further cancer treatment.    11/12/2018 Genetic Testing   Negative genetic testing on the common hereditary cancer panel.  The Hereditary Gene Panel offered by [facility] includes sequencing and/or deletion duplication testing of the following 48 genes: APC, ATM, AXIN2, BARD1, BMPR1A, BRCA1, BRCA2, BRIP1, CDH1, CDK4, CDKN2A (p14ARF), CDKN2A (p16INK4a), CHEK2, CTNNA1, DICER1, EPCAM (Deletion/duplication testing only),  GREM1 (promoter region deletion/duplication testing only), KIT, MEN1, MLH1, MSH2, MSH3, MSH6, MUTYH, NBN, NF1, NHTL1, PALB2, PDGFRA, PMS2, POLD1, POLE, PTEN, RAD50, RAD51C, RAD51D, RNF43, SDHB, SDHC, SDHD, SMAD4, SMARCA4. STK11, TP53, TSC1, TSC2, and VHL.  The following genes were evaluated for sequence changes only: SDHA and HOXB13 c.251G>A variant only. The report date is 11/12/2018.  AXIN2 c.815+fdup ([employer]) VUS identified.  This is still a normal result and will not change medical management.   01/07/2019 Surgery   LEFT BREAST LUMPECTOMY WITH RADIOACTIVE SEED AND LEFT DEEP AXILLARY SENTINEL LYMPH NODE BIOPSY WITH BLUE DYE INJECTION by Dr Dalbert Batman 01/07/19    01/07/2019 Pathology Results   Diagnosis 01/07/19 1. Breast, lumpectomy, Left w/seed - INVASIVE LOBULAR CARCINOMA, GRADE 2, 1.5 CM. SEE NOTE - CARCINOMA IS 0.4 CM FROM THE SUPERIOR MARGIN AND 0.6 CM FROM THE INFERIOR MARGIN - NO EVIDENCE OF LYMPHOVASCULAR OR PERINEURAL INVASION - BIOPSY SITE CHANGES - SEE ONCOLOGY TABLE 2. Lymph node, sentinel, biopsy, Left axillary #1 - LYMPH NODE, NEGATIVE FOR CARCINOMA (0/1). SEE NOTE 3. Lymph node, sentinel, biopsy, Left axillary #2 - LYMPH NODE, NEGATIVE FOR CARCINOMA (0/1). SEE NOTE 4. Lymph node, sentinel, biopsy, Left - LYMPH NODE, NEGATIVE FOR CARCINOMA (0/1). SEE NOTE 5. Lymph node, sentinel, biopsy, Left - LYMPH NODE, NEGATIVE FOR CARCINOMA (0/1). SEE NOTE 6. Lymph node, sentinel, biopsy, Left axillary #3 - LYMPH NODE, NEGATIVE FOR CARCINOMA (0/1). SEE NOTE   01/07/2019  Cancer Staging   Staging form: Breast, AJCC 8th Edition - Pathologic stage from 01/07/2019: Stage IA (pT1c, pN0, cM0, G2, ER+, PR+, HER2-, Oncotype DX score: 33) - Signed by [name], MD on 01/27/2019   02/19/2019 - 05/14/2019 Adjuvant Chemotherapy   Adjuvant TC every 3 weeks for 4 cycles starting 02/19/19. She had allergic reation to Taxol with C2 during infusion. She was able to complete Cytoxan that cycle but not much Taxol. I  switched taxol to Abraxane with C3. Completed chemo on 05/14/19.    06/17/2019 - 08/06/2019 Radiation Therapy   Adjuvant Radiation with Dr Sondra Come 06/17/19-08/06/19       CURRENT THERAPY:  -Anastrozole '1mg'$  daily starting 11/2018 (before surgery), held on 01/27/19 for further cancer treatment. Plan to switch to Exemestane in Jan 2021 -Adjuvant Radiation with Dr Sondra Come 06/17/19-08/06/19  INTERVAL HISTORY:  [name] is here for a follow up and treatment. She presents to the clinic alone.  She started breast radiation on June 17, 2019, and has been tolerating radiation very well overall.  She has mild skin redness, no significant fatigue or other side effects.  She denies significant pain, appetite and energy level has been normal, the review of system otherwise negative.  MEDICAL HISTORY:  Past Medical History:  Diagnosis Date   Allergy    Breast cancer ([place]) 09/11/14   right breast   Breast cancer in female [facility]) 01/2019   Breast cancer of upper-inner quadrant of right female breast ([place]) 09/15/2014   Family history of breast cancer    Family history of colon cancer    Family history of pancreatic cancer    Hot flashes    Hyperlipidemia    Hypothyroidism    Personal history of radiation therapy 2016   Radiation 11/25/14-01/06/15   Right Breast    SURGICAL HISTORY: Past Surgical History:  Procedure Laterality Date   AUGMENTATION MAMMAPLASTY Bilateral 03/10/1997   BREAST BIOPSY Right 09/11/2014   BREAST LUMPECTOMY Right 10/20/2014   BREAST LUMPECTOMY WITH RADIOACTIVE SEED AND SENTINEL LYMPH NODE BIOPSY N/A 01/07/2019   Procedure: LEFT BREAST LUMPECTOMY WITH RADIOACTIVE SEED AND LEFT DEEP AXILLARY SENTINEL LYMPH NODE BIOPSY WITH BLUE DYE INJECTION;  Surgeon: Fanny Skates, MD;  Location: [facility];  Service: General;  Laterality: N/A;   COLONOSCOPY     DILATION AND CURETTAGE OF UTERUS     KNEE ARTHROSCOPY     PLACEMENT OF BREAST IMPLANTS  1995   bilat breast  implants   TONSILLECTOMY     TUBAL LIGATION     WISDOM TOOTH EXTRACTION      I have reviewed the social history and family history with the patient and they are unchanged from previous note.  ALLERGIES:  is allergic to docetaxel.  MEDICATIONS:  Current Outpatient Medications  Medication Sig Dispense Refill   Ascorbic Acid (VITAMIN C) 1000 MG tablet Take 2,000 mg by mouth daily.      Calcium Carb-Cholecalciferol (CALCIUM 600 + D PO) Take 2 tablets by mouth daily.      cholecalciferol (VITAMIN D3) 25 MCG (1000 UT) tablet Take 1,000 Units by mouth daily.      Melatonin 10 MG CAPS Take 20 mg by mouth at bedtime as needed (sleep).      methimazole (TAPAZOLE) 5 MG tablet Take 5 mg by mouth daily.     Multiple Vitamin (MULTIVITAMIN WITH MINERALS) TABS tablet Take 1 tablet by mouth daily.      Naproxen Sod-diphenhydrAMINE (ALEVE PM) 220-25 MG TABS Take 2 tablets  by mouth at bedtime as needed (sleep).     naproxen sodium (ALEVE) 220 MG tablet Take 440 mg by mouth 2 (two) times daily as needed (pain).     sertraline (ZOLOFT) 100 MG tablet Take 200 mg by mouth daily.      simvastatin (ZOCOR) 40 MG tablet Take 40 mg by mouth every evening.     vitamin E 100 UNIT capsule Take 200 Units by mouth daily.      exemestane (AROMASIN) 25 MG tablet Take 1 tablet (25 mg total) by mouth daily after breakfast. 30 tablet 3   HYDROcodone-acetaminophen (NORCO) 5-325 MG tablet Take 1 tablet by mouth every 6 (six) hours as needed for moderate pain or severe pain. (Patient not taking: Reported on 07/22/2019) 5 tablet 0   hydrOXYzine (ATARAX/VISTARIL) 25 MG tablet Take 1-2 tablets (25-50 mg total) by mouth every 6 (six) hours as needed. (Patient not taking: Reported on 07/22/2019) 30 tablet 0   No current facility-administered medications for this visit.    PHYSICAL EXAMINATION: ECOG PERFORMANCE STATUS: 0 - Asymptomatic  Vitals:   07/22/19 1219  BP: 131/66  Pulse: 67  Resp: 18  Temp: 98.2 F  (36.8 C)  SpO2: 98%   Filed Weights   07/22/19 1219  Weight: 145 lb 6.4 oz (66 kg)    GENERAL:alert, no distress and comfortable SKIN: skin color, texture, turgor are normal, no rashes or significant lesions EYES: normal, Conjunctiva are pink and non-injected, sclera clear NECK: supple, thyroid normal size, non-tender, without nodularity LYMPH:  no palpable lymphadenopathy in the cervical, axillary  LUNGS: clear to auscultation and percussion with normal breathing effort HEART: regular rate & rhythm and no murmurs and no lower extremity edema ABDOMEN:abdomen soft, non-tender and normal bowel sounds Musculoskeletal:no cyanosis of digits and no clubbing  NEURO: alert & oriented x 3 with fluent speech, no focal motor/sensory deficits Breasts: Breast inspection showed them to be symmetrical with no nipple discharge.  Surgical scar in right and left breast have healed very well.  Diffuse mild skin erythema of left breast from radiation, no skin breakdown or peeling. Palpation of the breasts and axilla revealed no obvious mass that I could appreciate.  LABORATORY DATA:  I have reviewed the data as listed CBC Latest Ref Rng & Units 07/22/2019 05/14/2019 04/23/2019  WBC 4.0 - 10.5 K/uL 5.8 9.3 7.9  Hemoglobin 12.0 - 15.0 g/dL 13.9 12.2 13.2  Hematocrit 36.0 - 46.0 % 42.8 37.3 40.7  Platelets 150 - 400 K/uL 227 255 362     CMP Latest Ref Rng & Units 07/22/2019 05/14/2019 04/23/2019  Glucose 70 - 99 mg/dL 105(H) 126(H) 125(H)  BUN 8 - 23 mg/dL _0 Creatinine 0.44 - 1.00 mg/dL 0.76 0.75 0.77  Sodium 135 - 145 mmol/L 142 143 144  Potassium 3.5 - 5.1 mmol/L 4.3 4.2 3.8  Chloride 98 - 111 mmol/L 103 105 108  CO2 22 - 32 mmol/L _1 Calcium 8.9 - 10.3 mg/dL 9.7 10.0 9.6  Total Protein 6.5 - 8.1 g/dL 7.0 6.7 6.5  Total Bilirubin 0.3 - 1.2 mg/dL 0.3 0.4 0.3  Alkaline Phos 38 - 126 U/L 88 110 103  AST 15 - 41 U/L _2 ALT 0 - 44 U/L _3 RADIOGRAPHIC STUDIES: I  have personally reviewed the radiological images as listed and agreed with the findings in the report. No results found.   ASSESSMENT & PLAN:  Evani Shrider  Schwenke is a 69 y.o. female with   1.Malignant neoplasm of upper-inner quadrant of left breast,invasive lobular carcinoma,StageIA,pT1c,N0,M0, ER+/PR+, HER2-, GradeII, RS 33 -She was diagnosed in 10/2018. She started neoadjuvant anastrozole in 4/2020due to the delay of her surgery due to [place] pandemic.This was held after her breast surgery. -She underwent left breast lumpectomy on 01/07/19. Given her RS 33 and high risk disease,She completed 4 cycles of adjvuant chemo TC.  -She has recovered well from chemotherapy, no significant residual side effects -To reduce her risk of local recurrence she is currnetly undergoing Adjuvant radiaiton with Dr. Sondra Come. Plan to complete on 08/06/19. She is tolerating well. -Plan to start her on adjuvant aromatase inhibitor after she completes radiation.  She previously had a bilateral wrist arthralgia from anastrozole.  I discussed other options, and recommend her to try exemestane.  I called in to her pharmacy today, and she will start in mid January, when she recovers well from radiation. -Survivorship in 3 months, and office visit in 6 months. -Continue breast cancer surveillance.  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 She is s/p rightlumpectomyand SLNB and adjuvant radiation.  -Given her negative ER/PR status, there is no benefit of antiestrogen therapy. Wepreviouslydiscussed chemoprevention for breast cancer in general, and I do not recommend it given her history of ER/PR negative DCIS -Continuesurveillance. No evidence of recurrence.   3. Genetic BRCA plus panelwas negativefor pathogenetic mutations  4. Bone health  -She has not had DEXA scan in years  -will get a DEXA in early  2021  PLAN: -I called in exemestane 25 mg daily to her pharmacy today, she was started in mid January  -bilateral mammogram in breast center in February 2021 -Virtual survivorship with NP Lacie in 3 months -Lab and follow-up in 6 months    No problem-specific Assessment & Plan notes found for this encounter.   Orders Placed This Encounter  Procedures   MM DIAG BREAST TOMO BILATERAL    HTA Pf: 10/18/18_0     Standing Status:   Future    Standing Expiration Date:   07/21/2020    Order Specific Question:   Reason for Exam (SYMPTOM  OR DIAGNOSIS REQUIRED)    Answer:   screening    Order Specific Question:   Preferred imaging location?    Answer:   [facility]   All questions were answered. The patient knows to call the clinic with any problems, questions or concerns. No barriers to learning was detected. I spent 20 minutes counseling the patient face to face. The total time spent in the appointment was 25 minutes and more than 50% was on counseling and review of test results     [name], MD 07/22/2019   I, Joslyn Devon, am acting as scribe for [name], MD.   I have reviewed the above documentation for accuracy and completeness, and I agree with the above.

## 2019-07-22 ENCOUNTER — Other Ambulatory Visit: Payer: Self-pay

## 2019-07-22 ENCOUNTER — Inpatient Hospital Stay (HOSPITAL_BASED_OUTPATIENT_CLINIC_OR_DEPARTMENT_OTHER): Payer: PPO | Admitting: Hematology

## 2019-07-22 ENCOUNTER — Inpatient Hospital Stay: Payer: PPO | Attending: Hematology

## 2019-07-22 ENCOUNTER — Encounter: Payer: Self-pay | Admitting: Hematology

## 2019-07-22 ENCOUNTER — Ambulatory Visit
Admission: RE | Admit: 2019-07-22 | Discharge: 2019-07-22 | Disposition: A | Discharge status: Home / Self Care | Payer: PPO | Source: Ambulatory Visit | Attending: Radiation Oncology | Admitting: Radiation Oncology

## 2019-07-22 ENCOUNTER — Ambulatory Visit (INDEPENDENT_AMBULATORY_CARE_PROVIDER_SITE_OTHER): Payer: PPO | Admitting: Sports Medicine

## 2019-07-22 ENCOUNTER — Encounter: Payer: Self-pay | Admitting: Sports Medicine

## 2019-07-22 VITALS — BP 131/66 | HR 67 | Temp 98.2°F | Resp 18 | Ht 61.0 in | Wt 145.4 lb

## 2019-07-22 DIAGNOSIS — Z510 Encounter for antineoplastic radiation therapy: Secondary | ICD-10-CM | POA: Diagnosis not present

## 2019-07-22 DIAGNOSIS — S9032XD Contusion of left foot, subsequent encounter: Secondary | ICD-10-CM | POA: Diagnosis not present

## 2019-07-22 DIAGNOSIS — M79672 Pain in left foot: Secondary | ICD-10-CM

## 2019-07-22 DIAGNOSIS — C50212 Malignant neoplasm of upper-inner quadrant of left female breast: Secondary | ICD-10-CM | POA: Diagnosis not present

## 2019-07-22 DIAGNOSIS — C50211 Malignant neoplasm of upper-inner quadrant of right female breast: Secondary | ICD-10-CM | POA: Diagnosis not present

## 2019-07-22 DIAGNOSIS — S93602D Unspecified sprain of left foot, subsequent encounter: Secondary | ICD-10-CM | POA: Diagnosis not present

## 2019-07-22 DIAGNOSIS — Z170 Estrogen receptor positive status [ER+]: Secondary | ICD-10-CM

## 2019-07-22 DIAGNOSIS — Z171 Estrogen receptor negative status [ER-]: Secondary | ICD-10-CM

## 2019-07-22 LAB — CMP (CANCER CENTER ONLY)
ALT: 21 U/L (ref 0–44)
AST: 21 U/L (ref 15–41)
Albumin: 4.1 g/dL (ref 3.5–5.0)
Alkaline Phosphatase: 88 U/L (ref 38–126)
Anion gap: 8 (ref 5–15)
BUN: 18 mg/dL (ref 8–23)
CO2: 31 mmol/L (ref 22–32)
Calcium: 9.7 mg/dL (ref 8.9–10.3)
Chloride: 103 mmol/L (ref 98–111)
Creatinine: 0.76 mg/dL (ref 0.44–1.00)
GFR, Est AFR Am: >60 mL/min (ref >60)
GFR, Estimated: >60 mL/min (ref >60)
Glucose, Bld: 105 mg/dL — ABNORMAL HIGH (ref 70–99)
Potassium: 4.3 mmol/L (ref 3.5–5.1)
Sodium: 142 mmol/L (ref 135–145)
Total Bilirubin: 0.3 mg/dL (ref 0.3–1.2)
Total Protein: 7 g/dL (ref 6.5–8.1)

## 2019-07-22 LAB — CBC WITH DIFFERENTIAL (CANCER CENTER ONLY)
Abs Immature Granulocytes: 0.02 10*3/uL (ref 0.00–0.07)
Basophils Absolute: 0 10*3/uL (ref 0.0–0.1)
Basophils Relative: 0 %
Eosinophils Absolute: 0 10*3/uL (ref 0.0–0.5)
Eosinophils Relative: 1 %
HCT: 42.8 % (ref 36.0–46.0)
Hemoglobin: 13.9 g/dL (ref 12.0–15.0)
Immature Granulocytes: 0 %
Lymphocytes Relative: 21 %
Lymphs Abs: 1.2 10*3/uL (ref 0.7–4.0)
MCH: 28.8 pg (ref 26.0–34.0)
MCHC: 32.5 g/dL (ref 30.0–36.0)
MCV: 88.6 fL (ref 80.0–100.0)
Monocytes Absolute: 0.5 10*3/uL (ref 0.1–1.0)
Monocytes Relative: 8 %
Neutro Abs: 4.1 10*3/uL (ref 1.7–7.7)
Neutrophils Relative %: 70 %
Platelet Count: 227 10*3/uL (ref 150–400)
RBC: 4.83 MIL/uL (ref 3.87–5.11)
RDW: 12.5 % (ref 11.5–15.5)
WBC Count: 5.8 10*3/uL (ref 4.0–10.5)
nRBC: 0 % (ref 0.0–0.2)

## 2019-07-22 MED ORDER — EXEMESTANE 25 MG PO TABS: 25.0000 mg | ORAL_TABLET | Freq: Every day | ORAL | 3 refills | Status: DC

## 2019-07-22 NOTE — Progress Notes (Signed)
.  Simulation verification  The patient was brought to the treatment machine and placed in the plan treatment position.  Clinical set up was verified to ensure that the target region is appropriately covered for the patient's upcoming electron boost treatment.  The targeted volume of tissue is appropriately covered by the radiation field.  Based on my personal review, I approve the simulation verification.  The patient's treatment will proceed as planned.  ------------------------------------------------  -----------------------------------  Nell Schrack D. Prentice Sackrider, PhD, MD  

## 2019-07-22 NOTE — Patient Instructions (Signed)
For tennis shoes recommend:  Kandy Garrison Ascis New balance [place] HOKA Can be purchased at [employer] sports or [employer]  Vionic  SAS Can be purchased at [employer] or [employer]   For work shoes recommend: [employer] Work [employer]  Can be purchased at a variety of places or Engineer, maintenance (IT)   For casual shoes recommend: Vionic  Can be purchased at [employer] or [employer]

## 2019-07-22 NOTE — Progress Notes (Signed)
Subjective: [name] is a 68 y.o. female patient who returns to office for follow up eval of Left foot pain. Reports pain is better only a little pain to ball of left foot. Denies redness, warmth, swelling or any other symptoms at this time.  Patient Active Problem List   Diagnosis Date Noted  . Genetic testing 11/14/2018  . Family history of breast cancer   . Family history of colon cancer   . Family history of pancreatic cancer   . Malignant neoplasm of upper-inner quadrant of left breast in female, estrogen receptor positive ([place]) 10/18/2018  . 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 Family history of malignant neoplasm of breast 09/24/2014  . Family history of malignant neoplasm of gastrointestinal tract 09/24/2014  . Breast cancer of upper-inner quadrant of right female breast ([place]) 09/15/2014    Current Outpatient Medications on File Prior to Visit  Medication Sig Dispense Refill  . Ascorbic Acid (VITAMIN C) 1000 MG tablet Take 2,000 mg by mouth daily.     . Calcium Carb-Cholecalciferol (CALCIUM 600 + D PO) Take 2 tablets by mouth daily.     . cholecalciferol (VITAMIN D3) 25 MCG (1000 UT) tablet Take 1,000 Units by mouth daily.     Marland Kitchen HYDROcodone-acetaminophen (NORCO) 5-325 MG tablet Take 1 tablet by mouth every 6 (six) hours as needed for moderate pain or severe pain. (Patient not taking: Reported on 07/22/2019) 5 tablet 0  . hydrOXYzine (ATARAX/VISTARIL) 25 MG tablet Take 1-2 tablets (25-50 mg total) by mouth every 6 (six) hours as needed. (Patient not taking: Reported on 07/22/2019) 30 tablet 0  . Melatonin 10 MG CAPS Take 20 mg by mouth at bedtime as needed (sleep).     . methimazole (TAPAZOLE) 5 MG tablet Take 5 mg by mouth daily.    . Multiple Vitamin (MULTIVITAMIN WITH MINERALS) TABS tablet Take 1 tablet by mouth daily.     . Naproxen Sod-diphenhydrAMINE (ALEVE PM) 220-25 MG TABS Take 2 tablets by mouth at bedtime as needed (sleep).    . naproxen sodium (ALEVE)  220 MG tablet Take 440 mg by mouth 2 (two) times daily as needed (pain).    Marland Kitchen sertraline (ZOLOFT) 100 MG tablet Take 200 mg by mouth daily.     . simvastatin (ZOCOR) 40 MG tablet Take 40 mg by mouth every evening.    . vitamin E 100 UNIT capsule Take 200 Units by mouth daily.      No current facility-administered medications on file prior to visit.    Allergies  Allergen Reactions  . Docetaxel     SOB, chest pressure, dizziness    Objective:  General: Alert and oriented x3 in no acute distress  Dermatology: No open lesions bilateral lower extremities, no webspace macerations, no ecchymosis bilateral, all nails x 10 are well manicured.  Vascular: Dorsalis Pedis and Posterior Tibial pedal pulses palpable, Capillary Fill Time 3 seconds,(+) pedal hair growth bilateral, no edema bilateral lower extremities, Temperature gradient within normal limits.  Neurology: Gross sensation intact via light touch bilateral.   Musculoskeletal: No tenderness with palpation at dorsal midfoot. Subjective pain to ball on left.  No pain with calf compression bilateral. There is diffuse midfoot pain to palpation.  Strength within normal limits in all groups bilateral.   Assessment and Plan: Problem List Items Addressed This Visit    None    Visit Diagnoses    Left foot pain    -  Primary   Sprain of left  foot, subsequent encounter       Contusion of left foot, subsequent encounter           -Complete examination performed -Discussed metatarsal pain with patient likely compensatory in nature -Dispensed met pad -Recommend good supportive shoes -Patient to return to office as needed or sooner if problems arise.  Landis Martins, DPM

## 2019-07-23 ENCOUNTER — Other Ambulatory Visit: Payer: Self-pay

## 2019-07-23 ENCOUNTER — Telehealth: Payer: Self-pay | Admitting: Hematology

## 2019-07-23 ENCOUNTER — Ambulatory Visit
Admission: RE | Admit: 2019-07-23 | Discharge: 2019-07-23 | Disposition: A | Discharge status: Home / Self Care | Payer: PPO | Source: Ambulatory Visit | Attending: Radiation Oncology | Admitting: Radiation Oncology

## 2019-07-23 DIAGNOSIS — Z510 Encounter for antineoplastic radiation therapy: Secondary | ICD-10-CM | POA: Diagnosis not present

## 2019-07-23 DIAGNOSIS — C50212 Malignant neoplasm of upper-inner quadrant of left female breast: Secondary | ICD-10-CM | POA: Diagnosis not present

## 2019-07-23 NOTE — Telephone Encounter (Signed)
Scheduled appt per 12/15 los.  Printed and mailed appt calendar.

## 2019-07-24 ENCOUNTER — Ambulatory Visit
Admission: RE | Admit: 2019-07-24 | Discharge: 2019-07-24 | Disposition: A | Discharge status: Home / Self Care | Payer: PPO | Source: Ambulatory Visit | Attending: Radiation Oncology | Admitting: Radiation Oncology

## 2019-07-24 ENCOUNTER — Other Ambulatory Visit: Payer: Self-pay

## 2019-07-24 DIAGNOSIS — Z510 Encounter for antineoplastic radiation therapy: Secondary | ICD-10-CM | POA: Diagnosis not present

## 2019-07-24 DIAGNOSIS — C50212 Malignant neoplasm of upper-inner quadrant of left female breast: Secondary | ICD-10-CM | POA: Diagnosis not present

## 2019-07-25 ENCOUNTER — Ambulatory Visit
Admission: RE | Admit: 2019-07-25 | Discharge: 2019-07-25 | Disposition: A | Discharge status: Home / Self Care | Payer: PPO | Source: Ambulatory Visit | Attending: Radiation Oncology | Admitting: Radiation Oncology

## 2019-07-25 ENCOUNTER — Other Ambulatory Visit: Payer: Self-pay

## 2019-07-25 DIAGNOSIS — C50212 Malignant neoplasm of upper-inner quadrant of left female breast: Secondary | ICD-10-CM | POA: Diagnosis not present

## 2019-07-25 DIAGNOSIS — Z510 Encounter for antineoplastic radiation therapy: Secondary | ICD-10-CM | POA: Diagnosis not present

## 2019-07-28 ENCOUNTER — Ambulatory Visit: Payer: PPO

## 2019-07-28 ENCOUNTER — Ambulatory Visit
Admission: RE | Admit: 2019-07-28 | Discharge: 2019-07-28 | Disposition: A | Discharge status: Home / Self Care | Payer: PPO | Source: Ambulatory Visit | Attending: Radiation Oncology | Admitting: Radiation Oncology

## 2019-07-28 ENCOUNTER — Other Ambulatory Visit: Payer: Self-pay

## 2019-07-28 DIAGNOSIS — C50212 Malignant neoplasm of upper-inner quadrant of left female breast: Secondary | ICD-10-CM | POA: Diagnosis not present

## 2019-07-28 DIAGNOSIS — E0500 Thyrotoxicosis with diffuse goiter without thyrotoxic crisis or storm: Secondary | ICD-10-CM | POA: Diagnosis not present

## 2019-07-28 DIAGNOSIS — C50912 Malignant neoplasm of unspecified site of left female breast: Secondary | ICD-10-CM | POA: Diagnosis not present

## 2019-07-28 DIAGNOSIS — E041 Nontoxic single thyroid nodule: Secondary | ICD-10-CM | POA: Diagnosis not present

## 2019-07-28 DIAGNOSIS — Z6827 Body mass index (BMI) 27.0-27.9, adult: Secondary | ICD-10-CM | POA: Diagnosis not present

## 2019-07-28 DIAGNOSIS — Z510 Encounter for antineoplastic radiation therapy: Secondary | ICD-10-CM | POA: Diagnosis not present

## 2019-07-28 DIAGNOSIS — E0590 Thyrotoxicosis, unspecified without thyrotoxic crisis or storm: Secondary | ICD-10-CM | POA: Diagnosis not present

## 2019-07-29 ENCOUNTER — Telehealth: Payer: Self-pay

## 2019-07-29 ENCOUNTER — Encounter: Payer: Self-pay | Admitting: *Deleted

## 2019-07-29 ENCOUNTER — Ambulatory Visit
Admission: RE | Admit: 2019-07-29 | Discharge: 2019-07-29 | Disposition: A | Discharge status: Home / Self Care | Payer: PPO | Source: Ambulatory Visit | Attending: Radiation Oncology | Admitting: Radiation Oncology

## 2019-07-29 ENCOUNTER — Ambulatory Visit: Payer: PPO

## 2019-07-29 ENCOUNTER — Other Ambulatory Visit: Payer: Self-pay

## 2019-07-29 DIAGNOSIS — C50212 Malignant neoplasm of upper-inner quadrant of left female breast: Secondary | ICD-10-CM | POA: Diagnosis not present

## 2019-07-29 DIAGNOSIS — Z510 Encounter for antineoplastic radiation therapy: Secondary | ICD-10-CM | POA: Diagnosis not present

## 2019-07-29 NOTE — Telephone Encounter (Signed)
Joy with Dr. Bobbe Medico office requested lab results including TSH be faxed over to [phone]. Last TSH was 02/26/19 1.587 but faxed CBC and CMP from 12/15.

## 2019-07-30 ENCOUNTER — Ambulatory Visit: Payer: PPO

## 2019-07-30 ENCOUNTER — Ambulatory Visit
Admission: RE | Admit: 2019-07-30 | Discharge: 2019-07-30 | Disposition: A | Discharge status: Home / Self Care | Payer: PPO | Source: Ambulatory Visit | Attending: Radiation Oncology | Admitting: Radiation Oncology

## 2019-07-30 ENCOUNTER — Other Ambulatory Visit: Payer: Self-pay

## 2019-07-30 DIAGNOSIS — Z510 Encounter for antineoplastic radiation therapy: Secondary | ICD-10-CM | POA: Diagnosis not present

## 2019-07-31 ENCOUNTER — Ambulatory Visit: Payer: PPO

## 2019-07-31 ENCOUNTER — Other Ambulatory Visit: Payer: Self-pay

## 2019-07-31 ENCOUNTER — Ambulatory Visit
Admission: RE | Admit: 2019-07-31 | Discharge: 2019-07-31 | Disposition: A | Discharge status: Home / Self Care | Payer: PPO | Source: Ambulatory Visit | Attending: Radiation Oncology | Admitting: Radiation Oncology

## 2019-07-31 DIAGNOSIS — C50212 Malignant neoplasm of upper-inner quadrant of left female breast: Secondary | ICD-10-CM | POA: Diagnosis not present

## 2019-07-31 DIAGNOSIS — Z510 Encounter for antineoplastic radiation therapy: Secondary | ICD-10-CM | POA: Diagnosis not present

## 2019-08-01 ENCOUNTER — Ambulatory Visit: Payer: PPO

## 2019-08-04 ENCOUNTER — Ambulatory Visit
Admission: RE | Admit: 2019-08-04 | Discharge: 2019-08-04 | Disposition: A | Discharge status: Home / Self Care | Payer: PPO | Source: Ambulatory Visit | Attending: Radiation Oncology | Admitting: Radiation Oncology

## 2019-08-04 ENCOUNTER — Ambulatory Visit: Payer: PPO

## 2019-08-04 ENCOUNTER — Other Ambulatory Visit: Payer: Self-pay

## 2019-08-04 DIAGNOSIS — Z510 Encounter for antineoplastic radiation therapy: Secondary | ICD-10-CM | POA: Diagnosis not present

## 2019-08-05 ENCOUNTER — Other Ambulatory Visit: Payer: Self-pay

## 2019-08-05 ENCOUNTER — Ambulatory Visit: Payer: PPO

## 2019-08-05 ENCOUNTER — Ambulatory Visit
Admission: RE | Admit: 2019-08-05 | Discharge: 2019-08-05 | Disposition: A | Discharge status: Home / Self Care | Payer: PPO | Source: Ambulatory Visit | Attending: Radiation Oncology | Admitting: Radiation Oncology

## 2019-08-05 DIAGNOSIS — Z510 Encounter for antineoplastic radiation therapy: Secondary | ICD-10-CM | POA: Diagnosis not present

## 2019-08-06 ENCOUNTER — Encounter: Payer: Self-pay | Admitting: Radiation Oncology

## 2019-08-06 ENCOUNTER — Ambulatory Visit
Admission: RE | Admit: 2019-08-06 | Discharge: 2019-08-06 | Disposition: A | Discharge status: Home / Self Care | Payer: PPO | Source: Ambulatory Visit | Attending: Radiation Oncology | Admitting: Radiation Oncology

## 2019-08-06 ENCOUNTER — Other Ambulatory Visit: Payer: Self-pay

## 2019-08-06 DIAGNOSIS — Z510 Encounter for antineoplastic radiation therapy: Secondary | ICD-10-CM | POA: Diagnosis not present

## 2019-08-06 DIAGNOSIS — C50212 Malignant neoplasm of upper-inner quadrant of left female breast: Secondary | ICD-10-CM | POA: Diagnosis not present

## 2019-08-07 ENCOUNTER — Ambulatory Visit: Payer: PPO

## 2019-08-11 NOTE — Progress Notes (Incomplete)
  Patient Name: [name] MRN: [id number] DOB: [birth year] Referring Physician: Erroll Luna (Profile Not Attached) Date of Service: 08/06/2019 [facility] [place]                                                        End Of Treatment Note  Diagnoses: C50.212-Malignant neoplasm of upper-inner quadrant of left female breast  Cancer Staging: Stage IA (pT1c, N0, M0) Left Breast UIQ, Invasive Lobular Carcinoma, ER+/PR+, Her2-, Grade 2  Intent: Curative  Radiation Treatment Dates: 06/17/2019 through 08/06/2019 Site Technique Total Dose (Gy) Dose per Fx (Gy) Completed Fx Beam Energies  Breast, Left: Breast_Lt 3D 50.4/50.4 1.8 28/28 6X, 10X  Breast, Left: Breast_Lt_Bst specialPort 10/10 2 5/5 12E   Narrative: The patient tolerated radiation therapy relatively well. Patient reported some mild fatigue and pruritis of left breast throughout treatment, which was relieved with Hydrocortisone cream. She denied breast pain. Left breast did show some erythema and slight hyperpigmentation. There was some dry desquamation at the end of treatment without signs of infection.  Plan: The patient will follow-up with radiation oncology in one month.  ________________________________________________   Blair Promise, PhD, MD  This document serves as a record of services personally performed by Gery Pray, MD. It was created on his behalf by Clerance Lav, a trained medical scribe. The creation of this record is based on the scribe's personal observations and the provider's statements to them. This document has been checked and approved by the attending provider.

## 2019-09-01 DIAGNOSIS — L821 Other seborrheic keratosis: Secondary | ICD-10-CM | POA: Diagnosis not present

## 2019-09-01 DIAGNOSIS — Z86018 Personal history of other benign neoplasm: Secondary | ICD-10-CM | POA: Diagnosis not present

## 2019-09-01 DIAGNOSIS — L820 Inflamed seborrheic keratosis: Secondary | ICD-10-CM | POA: Diagnosis not present

## 2019-09-01 DIAGNOSIS — D225 Melanocytic nevi of trunk: Secondary | ICD-10-CM | POA: Diagnosis not present

## 2019-09-01 DIAGNOSIS — L814 Other melanin hyperpigmentation: Secondary | ICD-10-CM | POA: Diagnosis not present

## 2019-09-01 DIAGNOSIS — L578 Other skin changes due to chronic exposure to nonionizing radiation: Secondary | ICD-10-CM | POA: Diagnosis not present

## 2019-09-01 DIAGNOSIS — B078 Other viral warts: Secondary | ICD-10-CM | POA: Diagnosis not present

## 2019-09-01 DIAGNOSIS — Z808 Family history of malignant neoplasm of other organs or systems: Secondary | ICD-10-CM | POA: Diagnosis not present

## 2019-09-01 DIAGNOSIS — Z23 Encounter for immunization: Secondary | ICD-10-CM | POA: Diagnosis not present

## 2019-09-01 DIAGNOSIS — Z85828 Personal history of other malignant neoplasm of skin: Secondary | ICD-10-CM | POA: Diagnosis not present

## 2019-09-04 ENCOUNTER — Telehealth: Payer: Self-pay

## 2019-09-04 NOTE — Telephone Encounter (Signed)
Spoke with patient regarding [employer] Patient Assistance Program for exemestane.  We have filled out our portion of the forms.  Explained she need to fill out pages 2 and 3 and sign the back page (HIPAA) and return the forms with proof of income to [place] and we will fax to [employer].  She verbalized an understanding and will come by the [facility] today to pick it up.

## 2019-09-05 ENCOUNTER — Telehealth: Payer: Self-pay

## 2019-09-05 NOTE — Telephone Encounter (Signed)
Faxed application for patient assistance with exemestane to [place] program at fax [phone], including patient's proof of income.

## 2019-09-08 ENCOUNTER — Encounter: Payer: Self-pay | Admitting: Radiation Oncology

## 2019-09-08 ENCOUNTER — Other Ambulatory Visit: Payer: Self-pay

## 2019-09-08 ENCOUNTER — Ambulatory Visit
Admission: RE | Admit: 2019-09-08 | Discharge: 2019-09-08 | Disposition: A | Discharge status: Home / Self Care | Payer: PPO | Source: Ambulatory Visit | Attending: Radiation Oncology | Admitting: Radiation Oncology

## 2019-09-08 VITALS — BP 144/73 | HR 70 | Temp 97.5°F | Resp 18 | Wt 149.0 lb

## 2019-09-08 DIAGNOSIS — Z170 Estrogen receptor positive status [ER+]: Secondary | ICD-10-CM | POA: Diagnosis not present

## 2019-09-08 DIAGNOSIS — C50212 Malignant neoplasm of upper-inner quadrant of left female breast: Secondary | ICD-10-CM | POA: Diagnosis not present

## 2019-09-08 DIAGNOSIS — Z923 Personal history of irradiation: Secondary | ICD-10-CM | POA: Insufficient documentation

## 2019-09-08 DIAGNOSIS — Z79899 Other long term (current) drug therapy: Secondary | ICD-10-CM | POA: Diagnosis not present

## 2019-09-08 NOTE — Patient Instructions (Signed)
Coronavirus (COVID-19) Are you at risk?  Are you at risk for the Coronavirus (COVID-19)?  To be considered HIGH RISK for Coronavirus (COVID-19), you have to meet the following criteria:  . Traveled to [place], [place], [place], [place] or [place]; or in the [place] to [place], [place], [place], or [place]; and have fever, cough, and shortness of breath within the last 2 weeks of travel OR . Been in close contact with a person diagnosed with COVID-19 within the last 2 weeks and have fever, cough, and shortness of breath . IF YOU DO NOT MEET THESE CRITERIA, YOU ARE CONSIDERED LOW RISK FOR COVID-19.  What to do if you are HIGH RISK for COVID-19?  . If you are having a medical emergency, call 911. . Seek medical care right away. Before you go to a doctor's office, urgent care or emergency department, call ahead and tell them about your recent travel, contact with someone diagnosed with COVID-19, and your symptoms. You should receive instructions from your physician's office regarding next steps of care.  . When you arrive at healthcare provider, tell the healthcare staff immediately you have returned from visiting [place], [place], [place], [place] or [place]; or traveled in the [place] to [place], [place], [place], or [place]; in the last two weeks or you have been in close contact with a person diagnosed with COVID-19 in the last 2 weeks.   . Tell the health care staff about your symptoms: fever, cough and shortness of breath. . After you have been seen by a medical provider, you will be either: o Tested for (COVID-19) and discharged home on quarantine except to seek medical care if symptoms worsen, and asked to  - Stay home and avoid contact with others until you get your results (4-5 days)  - Avoid travel on public transportation if possible (such as bus, train, or airplane) or o Sent to the Emergency Department by EMS for evaluation, COVID-19 testing, and possible  admission depending on your condition and test results.  What to do if you are LOW RISK for COVID-19?  Reduce your risk of any infection by using the same precautions used for avoiding the common cold or flu:  . Wash your hands often with soap and warm water for at least 20 seconds.  If soap and water are not readily available, use an alcohol-based hand sanitizer with at least 60% alcohol.  . If coughing or sneezing, cover your mouth and nose by coughing or sneezing into the elbow areas of your shirt or coat, into a tissue or into your sleeve (not your hands). . Avoid shaking hands with others and consider head nods or verbal greetings only. . Avoid touching your eyes, nose, or mouth with unwashed hands.  . Avoid close contact with people who are sick. . Avoid places or events with large numbers of people in one location, like concerts or sporting events. . Carefully consider travel plans you have or are making. . If you are planning any travel outside or inside the [place], visit the CDC's Travelers' Health webpage for the latest health notices. . If you have some symptoms but not all symptoms, continue to monitor at home and seek medical attention if your symptoms worsen. . If you are having a medical emergency, call 911.   ADDITIONAL HEALTHCARE OPTIONS FOR PATIENTS  [facility] / e-Visit: [email or web address]         [facility]: [phone]  [facility]: [phone]                   [facility]: [phone]   

## 2019-09-08 NOTE — Progress Notes (Signed)
Radiation Oncology         [phone] ________________________________  Name: [name] MRN: [id number]  Date: 09/08/2019  DOB: 04-Sep-1950  Follow-Up Visit Note  CC: [name], DO  Erroll Luna, MD    ICD-10-CM   1. Malignant neoplasm of upper-inner quadrant of left breast in female, estrogen receptor positive ([place])  C50.212    Z17.0     Diagnosis: Stage IA (pT1c, N0, M0) Left Breast UIQ, Invasive Lobular Carcinoma, ER+/PR+, Her2-, Grade 2  Interval Since Last Radiation: One month and two days.  Radiation Treatment Dates: 06/17/2019 through 08/06/2019 Site Technique Total Dose (Gy) Dose per Fx (Gy) Completed Fx Beam Energies  Breast, Left: Breast_Lt 3D 50.4/50.4 1.8 28/28 6X, 10X  Breast, Left: Breast_Lt_Bst specialPort 10/10 2 5/5 12E    Narrative:  The patient returns today for routine follow-up. No significant interval history since the end of treatment.  On review of systems, the patient reports tolerating adjuvant hormonal therapy well. The patient denies pain within the left breast for discharge or bleeding.  She denies any problems with swelling in her left arm or hand.                             ALLERGIES:  is allergic to docetaxel.  Meds: Current Outpatient Medications  Medication Sig Dispense Refill  . Ascorbic Acid (VITAMIN C) 1000 MG tablet Take 2,000 mg by mouth daily.     . Calcium Carb-Cholecalciferol (CALCIUM 600 + D PO) Take 2 tablets by mouth daily.     . cholecalciferol (VITAMIN D3) 25 MCG (1000 UT) tablet Take 1,000 Units by mouth daily.     Marland Kitchen exemestane (AROMASIN) 25 MG tablet Take 1 tablet (25 mg total) by mouth daily after breakfast. 30 tablet 3  . Melatonin 10 MG CAPS Take 20 mg by mouth at bedtime as needed (sleep).     . methimazole (TAPAZOLE) 5 MG tablet Take 5 mg by mouth daily.    . Multiple Vitamin (MULTIVITAMIN WITH MINERALS) TABS tablet Take 1 tablet by mouth daily.     . Naproxen Sod-diphenhydrAMINE (ALEVE PM) 220-25 MG TABS  Take 2 tablets by mouth at bedtime as needed (sleep).    . naproxen sodium (ALEVE) 220 MG tablet Take 440 mg by mouth 2 (two) times daily as needed (pain).    Marland Kitchen sertraline (ZOLOFT) 100 MG tablet Take 200 mg by mouth daily.     . simvastatin (ZOCOR) 40 MG tablet Take 40 mg by mouth every evening.    . vitamin E 100 UNIT capsule Take 200 Units by mouth daily.      No current facility-administered medications for this encounter.    Physical Findings: The patient is in no acute distress. Patient is alert and oriented.  weight is 149 lb (67.6 kg). Her temperature is 97.5 F (36.4 C) (abnormal). Her blood pressure is 144/73 (abnormal) and her pulse is 70. Her respiration is 18 and oxygen saturation is 99%. .  No significant changes. Lungs are clear to auscultation bilaterally. Heart has regular rate and rhythm. No palpable cervical, supraclavicular, or axillary adenopathy. Abdomen soft, non-tender, normal bowel sounds. Right breast: No palpable mass, nipple discharge, or bleeding. Left breast: Skin well-healed.  Hyperpigmentation changes noted.  Mild edema in the nipple areolar complex area.  Cosmetic implant in place  Lab Findings: Lab Results  Component Value Date   WBC 5.8 07/22/2019   HGB 13.9 07/22/2019  HCT 42.8 07/22/2019   MCV 88.6 07/22/2019   PLT 227 07/22/2019    Radiographic Findings: No results found.  Impression: Stage IA (pT1c, N0, M0) Left Breast UIQ, Invasive Lobular Carcinoma, ER+/PR+, Her2-, Grade 2  The patient tolerated her radiation therapy well.  She reports no lingering side effects.  Plan: As needed follow-up in radiation oncology.  Patient will continue close follow-up with medical oncology and continue on adjuvant hormonal therapy.  ____________________________________   Blair Promise, PhD, MD  This document serves as a record of services personally performed by Gery Pray, MD. It was created on his behalf by Clerance Lav, a trained medical scribe.  The creation of this record is based on the scribe's personal observations and the provider's statements to them. This document has been checked and approved by the attending provider.

## 2019-09-08 NOTE — Progress Notes (Signed)
Pt presents today for one month f/u with Dr. Sondra Come. Pt reports fatigue has improved since completing XRT. Pt denies c/o pain in breast. Pt is using Curel after shower on breast. Breast is slightly hyperpigmented.   BP (!) 144/73 (BP Location: Right Arm, Patient Position: Sitting, Cuff Size: Normal)   Pulse 70   Temp (!) 97.5 F (36.4 C)   Resp 18   Wt 149 lb (67.6 kg)   SpO2 99%   BMI 28.15 kg/m   Wt Readings from Last 3 Encounters:  09/08/19 149 lb (67.6 kg)  07/22/19 145 lb 6.4 oz (66 kg)  06/09/19 149 lb 6.4 oz (67.8 kg)   Loma Sousa, RN BSN

## 2019-10-20 ENCOUNTER — Inpatient Hospital Stay: Payer: PPO | Attending: Hematology | Admitting: Nurse Practitioner

## 2019-10-20 ENCOUNTER — Telehealth: Payer: Self-pay | Admitting: Nurse Practitioner

## 2019-10-20 ENCOUNTER — Encounter: Payer: Self-pay | Admitting: Nurse Practitioner

## 2019-10-20 ENCOUNTER — Other Ambulatory Visit: Payer: Self-pay

## 2019-10-20 VITALS — BP 142/81 | HR 65 | Temp 98.0°F | Resp 18 | Wt 153.4 lb

## 2019-10-20 DIAGNOSIS — Z79811 Long term (current) use of aromatase inhibitors: Secondary | ICD-10-CM | POA: Insufficient documentation

## 2019-10-20 DIAGNOSIS — C50212 Malignant neoplasm of upper-inner quadrant of left female breast: Secondary | ICD-10-CM | POA: Insufficient documentation

## 2019-10-20 DIAGNOSIS — M8589 Other specified disorders of bone density and structure, multiple sites: Secondary | ICD-10-CM | POA: Diagnosis not present

## 2019-10-20 DIAGNOSIS — N951 Menopausal and female climacteric states: Secondary | ICD-10-CM | POA: Diagnosis not present

## 2019-10-20 DIAGNOSIS — Z170 Estrogen receptor positive status [ER+]: Secondary | ICD-10-CM | POA: Insufficient documentation

## 2019-10-20 DIAGNOSIS — G629 Polyneuropathy, unspecified: Secondary | ICD-10-CM | POA: Diagnosis not present

## 2019-10-20 NOTE — Progress Notes (Signed)
CLINIC:  Survivorship   Patient Care Team: [name], DO as PCP - General (Family Medicine) Erroll Luna, MD as Consulting Physician (General Surgery) [name], MD as Consulting Physician (Hematology) Thea Silversmith, MD as Consulting Physician (Radiation Oncology) Rockwell Germany, RN as Registered Nurse Mauro Kaufmann, RN as Registered Nurse Holley Bouche, NP (Inactive) as Nurse Practitioner (Nurse Practitioner) Sylvan Cheese, NP as Nurse Practitioner (Nurse Practitioner) Madelin Rear, MD as Consulting Physician (Endocrinology) Alla Feeling, NP as Nurse Practitioner (Nurse Practitioner)  REASON FOR VISIT:  Routine follow-up post-treatment for a recent history of breast cancer.  BRIEF ONCOLOGIC HISTORY:  Oncology History Overview Note  Cancer Staging Breast cancer of upper-inner quadrant of right female breast [facility]) Staging form: Breast, AJCC 7th Edition - Clinical stage from 09/23/2014: Stage 0 (Tis (DCIS), N0, M0) - Unsigned - Pathologic stage from 10/22/2014: Stage 0 (Tis (DCIS), N0, cM0) - Signed by [name], MD on 11/03/2014 Staging comments: Staged on final lumpectomy specimen by Dr. [name]  Malignant neoplasm of upper-inner quadrant of left breast in female, estrogen receptor positive ([place]) Staging form: Breast, AJCC 8th Edition - Clinical: cT1b, cN0, cM0, G2, ER+, PR+ - Unsigned    Breast cancer of upper-inner quadrant of right female breast ([place])  09/11/2014 Mammogram   Right breast: area of pleomorphic calcifications and associated architectural distortion, with irregular density at the 1 o'clock position of the right breast. No other suspicious findings in the right breast.    09/11/2014 Breast US   Right breast: area of hypoechogenecity 2 cm x 0.9 cm x 1.6 cm in size, 12 o'clock position, 3 cm the nipple. No discrete suspicious mass.   09/11/2014 Initial Biopsy   Right breast needle core bx (11-12 o'clock, UOQ): DCIS, grade 3, with  necrosis and calcifications, ER- (0%), PR- (0%)   09/11/2014 Pathologic Stage   Stage 0: Tis Nx    09/17/2014 Breast MRI   Non mass enhancement in the upper and slight outer right breast measuring approximately 2.6 x 1.7 x 2.2 cm. Biopsy proven fibrocystic changes in the upper outer periareolar right breast manifested as a 9 x 7 mm mass.   09/24/2014 Procedure   Genetic testing: BRCA plus and BreastNext panels ([place]) performed showing no clinically significant variants detected including ATM, BARD1, BRCA1, BRCA2, BRIP1, CDH1, CHEK2, MRE11A, MUTYH, NBN, NF1, PALB2, PTEN, RAD50, RAD51C, RAD51D, and TP53.   10/20/2014 Definitive Surgery   Right lumpectomy with SLNB ([place]): DCIS with necrosis and calcifications, grade 3, 1 mm from nearest margin (posterior), 0/2 LN with evidence of malignancy.   10/20/2014 Clinical Stage   Stage 0: Tis N0   11/25/2014 - 01/06/2015 Radiation Therapy   Adjuvant RT completed [name]): Right breast 50 Gy over 25 fractions; right breast boost 10 Gy over 5 fractions. Total dose received: 60 Gy.    04/13/2015 Survivorship   Survivorship care plan completed and copy mailed to patient as she was unable to be seen in the Survivorship clinic due to patient's schedule.   11/12/2018 Genetic Testing   Negative genetic testing on the common hereditary cancer panel.  The Hereditary Gene Panel offered by [facility] includes sequencing and/or deletion duplication testing of the following 48 genes: APC, ATM, AXIN2, BARD1, BMPR1A, BRCA1, BRCA2, BRIP1, CDH1, CDK4, CDKN2A (p14ARF), CDKN2A (p16INK4a), CHEK2, CTNNA1, DICER1, EPCAM (Deletion/duplication testing only), GREM1 (promoter region deletion/duplication testing only), KIT, MEN1, MLH1, MSH2, MSH3, MSH6, MUTYH, NBN, NF1, NHTL1, PALB2, PDGFRA, PMS2, POLD1, POLE, PTEN, RAD50, RAD51C, RAD51D, RNF43,  SDHB, SDHC, SDHD, SMAD4, SMARCA4. STK11, TP53, TSC1, TSC2, and VHL.  The following genes were evaluated for sequence changes only: SDHA and HOXB13  c.251G>A variant only. The report date is 11/12/2018.  AXIN2 c.815+fdup ([employer]) VUS identified.  This is still a normal result and will not change medical management.   Malignant neoplasm of upper-inner quadrant of left breast in female, estrogen receptor positive ([place])  10/18/2018 Initial Diagnosis   Malignant neoplasm of upper-inner quadrant of left breast in female, estrogen receptor positive ([place])   10/18/2018 Initial Biopsy   Diagnosis Breast, left, needle core biopsy, 10 o'clock, 4 cm fn - INVASIVE MAMMARY CARCINOMA - MAMMARY CARCINOMA IN SITU - SEE COMMENT   10/18/2018 Receptors her2   ER: 100% with strong staining  PR: 80% with strong staining  HER2 Negative    10/18/2018 Imaging   Diagnostic Mammogram 10/18/18  IMPRESSION 2 hypoechoic masses in the left breast at 10 o'clock, 4 cm from the nipple. The larger mass measures 5 x 6 x 5 mm. The second mass measures 3 x 5 x 5 mm. The masses are 7 mm apart and span a total dimension 1.7 cm. No axillary adenopathy on the left.   10/18/2018 Cancer Staging   Staging form: Breast, AJCC 8th Edition - Clinical stage from 10/18/2018: Stage IA (cT1b, cN0, cM0, G2, ER+, PR+, HER2-) - Signed by [name], MD on 11/07/2018   10/30/2018 Breast MRI   Breast MRI 10/30/18  IMPRESSION: Biopsy-proven malignancy within the left breast 10 o'clock position. No additional suspicious areas of enhancement identified within either breast.   11/2018 -  Anti-estrogen oral therapy   Anastrozole '1mg'$  daily starting 11/2018 (before surgery). Held after 01/27/19 for further cancer treatment.    11/12/2018 Genetic Testing   Negative genetic testing on the common hereditary cancer panel.  The Hereditary Gene Panel offered by [facility] includes sequencing and/or deletion duplication testing of the following 48 genes: APC, ATM, AXIN2, BARD1, BMPR1A, BRCA1, BRCA2, BRIP1, CDH1, CDK4, CDKN2A (p14ARF), CDKN2A (p16INK4a), CHEK2, CTNNA1, DICER1, EPCAM (Deletion/duplication  testing only), GREM1 (promoter region deletion/duplication testing only), KIT, MEN1, MLH1, MSH2, MSH3, MSH6, MUTYH, NBN, NF1, NHTL1, PALB2, PDGFRA, PMS2, POLD1, POLE, PTEN, RAD50, RAD51C, RAD51D, RNF43, SDHB, SDHC, SDHD, SMAD4, SMARCA4. STK11, TP53, TSC1, TSC2, and VHL.  The following genes were evaluated for sequence changes only: SDHA and HOXB13 c.251G>A variant only. The report date is 11/12/2018.  AXIN2 c.815+fdup ([employer]) VUS identified.  This is still a normal result and will not change medical management.   01/07/2019 Surgery   LEFT BREAST LUMPECTOMY WITH RADIOACTIVE SEED AND LEFT DEEP AXILLARY SENTINEL LYMPH NODE BIOPSY WITH BLUE DYE INJECTION by Dr Dalbert Batman 01/07/19    01/07/2019 Pathology Results   Diagnosis 01/07/19 1. Breast, lumpectomy, Left w/seed - INVASIVE LOBULAR CARCINOMA, GRADE 2, 1.5 CM. SEE NOTE - CARCINOMA IS 0.4 CM FROM THE SUPERIOR MARGIN AND 0.6 CM FROM THE INFERIOR MARGIN - NO EVIDENCE OF LYMPHOVASCULAR OR PERINEURAL INVASION - BIOPSY SITE CHANGES - SEE ONCOLOGY TABLE 2. Lymph node, sentinel, biopsy, Left axillary #1 - LYMPH NODE, NEGATIVE FOR CARCINOMA (0/1). SEE NOTE 3. Lymph node, sentinel, biopsy, Left axillary #2 - LYMPH NODE, NEGATIVE FOR CARCINOMA (0/1). SEE NOTE 4. Lymph node, sentinel, biopsy, Left - LYMPH NODE, NEGATIVE FOR CARCINOMA (0/1). SEE NOTE 5. Lymph node, sentinel, biopsy, Left - LYMPH NODE, NEGATIVE FOR CARCINOMA (0/1). SEE NOTE 6. Lymph node, sentinel, biopsy, Left axillary #3 - LYMPH NODE, NEGATIVE FOR CARCINOMA (0/1). SEE NOTE   01/07/2019 Cancer  Staging   Staging form: Breast, AJCC 8th Edition - Pathologic stage from 01/07/2019: Stage IA (pT1c, pN0, cM0, G2, ER+, PR+, HER2-, Oncotype DX score: 33) - Signed by [name], MD on 01/27/2019   01/21/2019 Oncotype testing   RS: 33, 21% distant recurrence risk at 9 years with tamoxifen alone; indicates >15% chemotherapy benefit    02/19/2019 - 05/14/2019 Adjuvant Chemotherapy   Adjuvant TC every 3 weeks for  4 cycles starting 02/19/19. She had allergic reation to Taxol with C2 during infusion. She was able to complete Cytoxan that cycle but not much Taxol. I switched taxol to Abraxane with C3. Completed chemo on 05/14/19.    06/17/2019 - 08/06/2019 Radiation Therapy   Adjuvant Radiation with Dr Sondra Come 06/17/19-08/06/19    08/2019 -  Anti-estrogen oral therapy   Started adjuvant Exemestane 08/2019    10/20/2019 Survivorship   SCP delivered by [name], NP      INTERVAL HISTORY:  [name] presents to the [facility] today for our initial meeting to review her survivorship care plan detailing her treatment course for breast cancer, as well as monitoring long-term side effects of that treatment, education regarding health maintenance, screening, and overall wellness and health promotion.     [name] is doing very well. Continues Exemestane. Tolerates with mild hot flashes and wrist pains. She remains functional, no other joint pain. She is fatigued from treatments but improving. Appetite is normal. Denies change in bowel habits or abdominal bloating or pain. Denies rectal or GYN bleeding. Denies recent infection, cough, chest pain, dyspnea, or leg swelling. 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 Denies nipple inversion or discharge or new breast lump.    ONCOLOGY TREATMENT TEAM:  1. Surgeon:  Dr. Brantley Stage at [facility] 2. Medical Oncologist: Dr. Burr Medico 3. Radiation Oncologist: Dr. Sondra Come     PAST MEDICAL/SURGICAL HISTORY:  Past Medical History:  Diagnosis Date  . Allergy   . Breast cancer ([place]) 09/11/14   right breast  . Breast cancer in female [facility]) 01/2019  . Breast cancer of upper-inner quadrant of right female breast ([place]) 09/15/2014  . Family history of breast cancer   . Family history of colon cancer   . Family history of pancreatic cancer   . Hot flashes   . Hyperlipidemia   . Hypothyroidism   . Personal history of radiation therapy 2016  .  Radiation 11/25/14-01/06/15   Right Breast   Past Surgical History:  Procedure Laterality Date  . AUGMENTATION MAMMAPLASTY Bilateral 03/10/1997  . BREAST BIOPSY Right 09/11/2014  . BREAST LUMPECTOMY Right 10/20/2014  . BREAST LUMPECTOMY WITH RADIOACTIVE SEED AND SENTINEL LYMPH NODE BIOPSY N/A 01/07/2019   Procedure: LEFT BREAST LUMPECTOMY WITH RADIOACTIVE SEED AND LEFT DEEP AXILLARY SENTINEL LYMPH NODE BIOPSY WITH BLUE DYE INJECTION;  Surgeon: Fanny Skates, MD;  Location: [facility];  Service: General;  Laterality: N/A;  . COLONOSCOPY    . DILATION AND CURETTAGE OF UTERUS    . KNEE ARTHROSCOPY    . PLACEMENT OF BREAST IMPLANTS  1995   bilat breast implants  . TONSILLECTOMY    . TUBAL LIGATION    . WISDOM TOOTH EXTRACTION       ALLERGIES:  Allergies  Allergen Reactions  . Docetaxel     SOB, chest pressure, dizziness     CURRENT MEDICATIONS:  Outpatient Encounter Medications as of 10/20/2019  Medication Sig  . Ascorbic Acid (VITAMIN C) 1000 MG tablet Take 2,000 mg by mouth daily.   . Calcium  Carb-Cholecalciferol (CALCIUM 600 + D PO) Take 2 tablets by mouth daily.   . cholecalciferol (VITAMIN D3) 25 MCG (1000 UT) tablet Take 1,000 Units by mouth daily.   Marland Kitchen exemestane (AROMASIN) 25 MG tablet Take 1 tablet (25 mg total) by mouth daily after breakfast.  . Melatonin 10 MG CAPS Take 20 mg by mouth at bedtime as needed (sleep).   . methimazole (TAPAZOLE) 5 MG tablet Take 5 mg by mouth daily.  . Multiple Vitamin (MULTIVITAMIN WITH MINERALS) TABS tablet Take 1 tablet by mouth daily.   . Naproxen Sod-diphenhydrAMINE (ALEVE PM) 220-25 MG TABS Take 2 tablets by mouth at bedtime as needed (sleep).  . naproxen sodium (ALEVE) 220 MG tablet Take 440 mg by mouth 2 (two) times daily as needed (pain).  Marland Kitchen sertraline (ZOLOFT) 100 MG tablet Take 200 mg by mouth daily.   . simvastatin (ZOCOR) 40 MG tablet Take 40 mg by mouth every evening.  . vitamin E 100 UNIT capsule Take 200 Units by mouth daily.     No facility-administered encounter medications on file as of 10/20/2019.     ONCOLOGIC FAMILY HISTORY:  Family History  Problem Relation Age of Onset  . Breast cancer Father 61  . Prostate cancer Father 60  . Throat cancer Father 56  . Cancer Father   . Breast cancer Mother        in early 33's  . Pancreatic cancer Maternal Uncle 75  . Cancer Maternal Uncle   . Pancreatic cancer Maternal Grandmother 59  . Cancer Maternal Grandmother   . Cancer Maternal Grandfather 62       leukemia  . Alcohol abuse Brother   . Alcohol abuse Paternal Uncle   . Lung cancer Maternal Uncle   . Colon cancer Maternal Uncle   . Colon polyps Neg Hx   . Esophageal cancer Neg Hx   . Rectal cancer Neg Hx   . Stomach cancer Neg Hx      GENETIC COUNSELING/TESTING: Yes, no pathogenic mutation, sequence changes only: SDHA and HOXB13 c.251G>A variant only. The report date is 11/12/2018.   AXIN2 c.815+fdup ([employer]) VUS identified.    SOCIAL HISTORY:  [name] lives in [place]. Has a significant other. Denies tobacco smoking.    PHYSICAL EXAMINATION:  Vital Signs:   Vitals:   10/20/19 1033  BP: (!) 142/81  Pulse: 65  Resp: 18  Temp: 98 F (36.7 C)  SpO2: 98%   Filed Weights   10/20/19 1033  Weight: 153 lb 6.4 oz (69.6 kg)   General: Well-nourished, well-appearing female in no acute distress.   HEENT:   Sclerae anicteric.  Lymph: No cervical, supraclavicular, or infraclavicular lymphadenopathy noted on palpation.  Cardiovascular: Regular rate and rhythm. Respiratory: Clear bilaterally, breathing non-labored.  GI: Abdomen soft and round; non-tender, non-distended. Bowel sounds normoactive.  Neuro: No focal deficits. Psych: Mood and affect normal and appropriate for situation.  Extremities: No edema. MSK: No focal spinal tenderness to palpation.  Full range of motion in bilateral upper extremities Skin: Warm and dry. Breast exam: s/p bilateral lumpectomies,  reconstruction, and radiation. Scars completely healed. Mild left breast edema and hyperpigmentation. Scar tissue at the right breast incision. No palpable mass in either breast or axilla that I could appreciate  LABORATORY DATA:  None for this visit.  DIAGNOSTIC IMAGING:  None for this visit.      ASSESSMENT AND PLAN:  [name] is a pleasant 69 y.o. female with Stage IA left breast invasive  ductal carcinoma, ER+/PR+/HER2-, diagnosed in 10/2018, treated with lumpectomy, adjuvant chemotherapy, radiation therapy, and anti-estrogen therapy with Exemestane beginning in 08/2019.  She presents to the Survivorship Clinic for our initial meeting and routine follow-up post-completion of treatment for breast cancer.    1. Stage IA left breast cancer:  [name] has recovered well from definitive treatment for breast cancer. She will follow-up with her medical oncologist, Dr. Burr Medico in 01/2020 with history and physical exam per surveillance protocol.  She will continue her anti-estrogen therapy with Exemestane. Thus far, she is tolerating the well with mild hot flashes and joint pain in her wrists. She was instructed to make Dr. Burr Medico or myself aware if she begins to experience any worsening side effects of the medication and I could see her back in clinic to help manage those side effects, as needed. Today's breast exam shows expected post-surgical and radiation changes, benign. No concern for recurrence. Today, a comprehensive survivorship care plan and treatment summary was reviewed with the patient today detailing her breast cancer diagnosis, treatment course, potential late/long-term effects of treatment, appropriate follow-up care with recommendations for the future, and patient education resources.  A copy of this summary, along with a letter will be sent to the patient's primary care provider via mail/fax/In Basket message after today's visit.    2. Bone health:  Given [name]'s age/history of breast  cancer and her current treatment regimen including anti-estrogen therapy with exemestane, she is at risk for bone demineralization.  Her last DEXA scan was 03/2019, which showed osteopenia in hips. I encouraged her to continues daily calcium and vitamin D as well as increase her weight-bearing activities.  She was given education on specific activities to promote bone health.  3. Cancer screening:  Due to [name]'s history and her age, she should receive screening for skin cancers, colon cancer, and gynecologic cancers. She is up to date on all age-appropriate screenings. The information and recommendations are listed on the patient's comprehensive care plan/treatment summary and were reviewed in detail with the patient.    4. Health maintenance and wellness promotion: [name] was encouraged to consume 5-7 servings of fruits and vegetables per day. We reviewed the "Nutrition Rainbow" handout, as well as the handout "Take Control of Your Health and Reduce Your Cancer Risk" from the [employer].  She was also encouraged to engage in moderate to vigorous exercise for 30 minutes per day most days of the week. We discussed the [employer] fitness program, which is designed for cancer survivors to help them become more physically fit after cancer treatments.  She was instructed to limit her alcohol consumption and continue to abstain from tobacco use.     5. Support services/counseling: It is not uncommon for this period of the patient's cancer care trajectory to be one of many emotions and stressors.  We discussed an opportunity for her to participate in the next session of [facility] ("Finding Your New Normal") support group series designed for patients after they have completed treatment.   [name] was encouraged to take advantage of our many other support services programs, support groups, and/or counseling in coping with her new life as a cancer survivor after completing anti-cancer treatment.   She was offered support today through active listening and expressive supportive counseling.  She was given information regarding our available services and encouraged to contact me with any questions or for help enrolling in any of our support group/programs.    Dispo:   -  Return to cancer center 01/2020  -Mammogram due 10/21/2019 -Follow up with surgery in Summer 2021  -She is welcome to return back to the Survivorship Clinic at any time; no additional follow-up needed at this time.  -Consider referral back to survivorship as a long-term survivor for continued surveillance  A total of (30) minutes of face-to-face time was spent with this patient with greater than 50% of that time in counseling and care-coordination.   [name], NP Survivorship Program [facility] [phone]   Note: PRIMARY CARE PROVIDER [name], [place] [phone]

## 2019-10-20 NOTE — Telephone Encounter (Signed)
No los per 3/15 

## 2019-10-21 ENCOUNTER — Ambulatory Visit
Admission: RE | Admit: 2019-10-21 | Discharge: 2019-10-21 | Disposition: A | Discharge status: Home / Self Care | Payer: PPO | Source: Ambulatory Visit | Attending: Hematology | Admitting: Hematology

## 2019-10-21 DIAGNOSIS — R928 Other abnormal and inconclusive findings on diagnostic imaging of breast: Secondary | ICD-10-CM | POA: Diagnosis not present

## 2019-10-21 DIAGNOSIS — F339 Major depressive disorder, recurrent, unspecified: Secondary | ICD-10-CM | POA: Diagnosis not present

## 2019-10-21 DIAGNOSIS — E0590 Thyrotoxicosis, unspecified without thyrotoxic crisis or storm: Secondary | ICD-10-CM | POA: Diagnosis not present

## 2019-10-21 DIAGNOSIS — C50212 Malignant neoplasm of upper-inner quadrant of left female breast: Secondary | ICD-10-CM

## 2019-10-21 DIAGNOSIS — Z7189 Other specified counseling: Secondary | ICD-10-CM | POA: Diagnosis not present

## 2019-10-21 DIAGNOSIS — Z853 Personal history of malignant neoplasm of breast: Secondary | ICD-10-CM | POA: Diagnosis not present

## 2019-10-21 DIAGNOSIS — Z170 Estrogen receptor positive status [ER+]: Secondary | ICD-10-CM

## 2019-10-21 DIAGNOSIS — E785 Hyperlipidemia, unspecified: Secondary | ICD-10-CM | POA: Diagnosis not present

## 2019-11-19 DIAGNOSIS — E0590 Thyrotoxicosis, unspecified without thyrotoxic crisis or storm: Secondary | ICD-10-CM | POA: Diagnosis not present

## 2019-11-19 DIAGNOSIS — E7800 Pure hypercholesterolemia, unspecified: Secondary | ICD-10-CM | POA: Diagnosis not present

## 2019-11-26 DIAGNOSIS — E041 Nontoxic single thyroid nodule: Secondary | ICD-10-CM | POA: Diagnosis not present

## 2019-11-26 DIAGNOSIS — E0590 Thyrotoxicosis, unspecified without thyrotoxic crisis or storm: Secondary | ICD-10-CM | POA: Diagnosis not present

## 2019-11-26 DIAGNOSIS — C50912 Malignant neoplasm of unspecified site of left female breast: Secondary | ICD-10-CM | POA: Diagnosis not present

## 2019-11-26 DIAGNOSIS — Z6827 Body mass index (BMI) 27.0-27.9, adult: Secondary | ICD-10-CM | POA: Diagnosis not present

## 2019-11-26 DIAGNOSIS — E0500 Thyrotoxicosis with diffuse goiter without thyrotoxic crisis or storm: Secondary | ICD-10-CM | POA: Diagnosis not present

## 2019-12-25 DIAGNOSIS — J069 Acute upper respiratory infection, unspecified: Secondary | ICD-10-CM | POA: Diagnosis not present

## 2019-12-25 DIAGNOSIS — J029 Acute pharyngitis, unspecified: Secondary | ICD-10-CM | POA: Diagnosis not present

## 2020-01-06 DIAGNOSIS — R05 Cough: Secondary | ICD-10-CM | POA: Diagnosis not present

## 2020-01-08 DIAGNOSIS — C50912 Malignant neoplasm of unspecified site of left female breast: Secondary | ICD-10-CM | POA: Diagnosis not present

## 2020-01-08 DIAGNOSIS — D0511 Intraductal carcinoma in situ of right breast: Secondary | ICD-10-CM | POA: Diagnosis not present

## 2020-01-15 ENCOUNTER — Telehealth: Payer: Self-pay

## 2020-01-15 NOTE — Telephone Encounter (Signed)
Opened in error

## 2020-01-19 ENCOUNTER — Inpatient Hospital Stay: Payer: PPO

## 2020-01-19 ENCOUNTER — Encounter: Payer: Self-pay | Admitting: Hematology

## 2020-01-19 ENCOUNTER — Inpatient Hospital Stay: Payer: PPO | Attending: Hematology | Admitting: Hematology

## 2020-01-19 ENCOUNTER — Other Ambulatory Visit: Payer: Self-pay

## 2020-01-19 VITALS — BP 162/82 | HR 65 | Temp 97.9°F | Resp 18 | Ht 61.0 in | Wt 156.5 lb

## 2020-01-19 DIAGNOSIS — M8589 Other specified disorders of bone density and structure, multiple sites: Secondary | ICD-10-CM | POA: Insufficient documentation

## 2020-01-19 DIAGNOSIS — Z170 Estrogen receptor positive status [ER+]: Secondary | ICD-10-CM | POA: Diagnosis not present

## 2020-01-19 DIAGNOSIS — Z171 Estrogen receptor negative status [ER-]: Secondary | ICD-10-CM

## 2020-01-19 DIAGNOSIS — N951 Menopausal and female climacteric states: Secondary | ICD-10-CM | POA: Insufficient documentation

## 2020-01-19 DIAGNOSIS — C50212 Malignant neoplasm of upper-inner quadrant of left female breast: Secondary | ICD-10-CM | POA: Insufficient documentation

## 2020-01-19 DIAGNOSIS — C50211 Malignant neoplasm of upper-inner quadrant of right female breast: Secondary | ICD-10-CM | POA: Insufficient documentation

## 2020-01-19 DIAGNOSIS — Z79811 Long term (current) use of aromatase inhibitors: Secondary | ICD-10-CM | POA: Insufficient documentation

## 2020-01-19 MED ORDER — EXEMESTANE 25 MG PO TABS: 25.0000 mg | ORAL_TABLET | Freq: Every day | ORAL | 3 refills | Status: DC

## 2020-01-19 NOTE — Progress Notes (Signed)
[place]   [phone] Fax:([phone]) [phone]   Clinic Follow up Note   Patient Care Team: [name], DO as PCP - General (Family Medicine) Erroll Luna, MD as Consulting Physician (General Surgery) [name], MD as Consulting Physician (Hematology) Thea Silversmith, MD as Consulting Physician (Radiation Oncology) Rockwell Germany, RN as Registered Nurse Mauro Kaufmann, RN as Registered Nurse Holley Bouche, NP (Inactive) as Nurse Practitioner (Nurse Practitioner) Sylvan Cheese, NP as Nurse Practitioner (Nurse Practitioner) Madelin Rear, MD as Consulting Physician (Endocrinology) Alla Feeling, NP as Nurse Practitioner (Nurse Practitioner)  Date of Service:  01/19/2020  CHIEF COMPLAINT: F/u of breast cancer   SUMMARY OF ONCOLOGIC HISTORY: Oncology History Overview Note  Cancer Staging Breast cancer of upper-inner quadrant of right female breast [facility]) Staging form: Breast, AJCC 7th Edition - Clinical stage from 09/23/2014: Stage 0 (Tis (DCIS), N0, M0) - Unsigned - Pathologic stage from 10/22/2014: Stage 0 (Tis (DCIS), N0, cM0) - Signed by [name], MD on 11/03/2014 Staging comments: Staged on final lumpectomy specimen by Dr. [name]  Malignant neoplasm of upper-inner quadrant of left breast in female, estrogen receptor positive ([place]) Staging form: Breast, AJCC 8th Edition - Clinical: cT1b, cN0, cM0, G2, ER+, PR+ - Unsigned    Breast cancer of upper-inner quadrant of right female breast ([place])  09/11/2014 Mammogram   Right breast: area of pleomorphic calcifications and associated architectural distortion, with irregular density at the 1 o'clock position of the right breast. No other suspicious findings in the right breast.    09/11/2014 Breast US   Right breast: area of hypoechogenecity 2 cm x 0.9 cm x 1.6 cm in size, 12 o'clock position, 3 cm the nipple. No discrete suspicious mass.   09/11/2014 Initial Biopsy   Right breast  needle core bx (11-12 o'clock, UOQ): DCIS, grade 3, with necrosis and calcifications, ER- (0%), PR- (0%)   09/11/2014 Pathologic Stage   Stage 0: Tis Nx    09/17/2014 Breast MRI   Non mass enhancement in the upper and slight outer right breast measuring approximately 2.6 x 1.7 x 2.2 cm. Biopsy proven fibrocystic changes in the upper outer periareolar right breast manifested as a 9 x 7 mm mass.   09/24/2014 Procedure   Genetic testing: BRCA plus and BreastNext panels ([place]) performed showing no clinically significant variants detected including ATM, BARD1, BRCA1, BRCA2, BRIP1, CDH1, CHEK2, MRE11A, MUTYH, NBN, NF1, PALB2, PTEN, RAD50, RAD51C, RAD51D, and TP53.   10/20/2014 Definitive Surgery   Right lumpectomy with SLNB ([place]): DCIS with necrosis and calcifications, grade 3, 1 mm from nearest margin (posterior), 0/2 LN with evidence of malignancy.   10/20/2014 Clinical Stage   Stage 0: Tis N0   11/25/2014 - 01/06/2015 Radiation Therapy   Adjuvant RT completed [name]): Right breast 50 Gy over 25 fractions; right breast boost 10 Gy over 5 fractions. Total dose received: 60 Gy.    04/13/2015 Survivorship   Survivorship care plan completed and copy mailed to patient as she was unable to be seen in the Survivorship clinic due to patient's schedule.   11/12/2018 Genetic Testing   Negative genetic testing on the common hereditary cancer panel.  The Hereditary Gene Panel offered by [facility] includes sequencing and/or deletion duplication testing of the following 48 genes: APC, ATM, AXIN2, BARD1, BMPR1A, BRCA1, BRCA2, BRIP1, CDH1, CDK4, CDKN2A (p14ARF), CDKN2A (p16INK4a), CHEK2, CTNNA1, DICER1, EPCAM (Deletion/duplication testing only), GREM1 (promoter region deletion/duplication testing only), KIT, MEN1, MLH1, MSH2, MSH3, MSH6, MUTYH, NBN, NF1,  NHTL1, PALB2, PDGFRA, PMS2, POLD1, POLE, PTEN, RAD50, RAD51C, RAD51D, RNF43, SDHB, SDHC, SDHD, SMAD4, SMARCA4. STK11, TP53, TSC1, TSC2, and VHL.  The following genes  were evaluated for sequence changes only: SDHA and HOXB13 c.251G>A variant only. The report date is 11/12/2018.  AXIN2 c.815+fdup ([employer]) VUS identified.  This is still a normal result and will not change medical management.   Malignant neoplasm of upper-inner quadrant of left breast in female, estrogen receptor positive ([place])  10/18/2018 Initial Diagnosis   Malignant neoplasm of upper-inner quadrant of left breast in female, estrogen receptor positive ([place])   10/18/2018 Initial Biopsy   Diagnosis Breast, left, needle core biopsy, 10 o'clock, 4 cm fn - INVASIVE MAMMARY CARCINOMA - MAMMARY CARCINOMA IN SITU - SEE COMMENT   10/18/2018 Receptors her2   ER: 100% with strong staining  PR: 80% with strong staining  HER2 Negative    10/18/2018 Imaging   Diagnostic Mammogram 10/18/18  IMPRESSION 2 hypoechoic masses in the left breast at 10 o'clock, 4 cm from the nipple. The larger mass measures 5 x 6 x 5 mm. The second mass measures 3 x 5 x 5 mm. The masses are 7 mm apart and span a total dimension 1.7 cm. No axillary adenopathy on the left.   10/18/2018 Cancer Staging   Staging form: Breast, AJCC 8th Edition - Clinical stage from 10/18/2018: Stage IA (cT1b, cN0, cM0, G2, ER+, PR+, HER2-) - Signed by [name], MD on 11/07/2018   10/30/2018 Breast MRI   Breast MRI 10/30/18  IMPRESSION: Biopsy-proven malignancy within the left breast 10 o'clock position. No additional suspicious areas of enhancement identified within either breast.   11/2018 -  Anti-estrogen oral therapy   Anastrozole 73m daily starting 11/2018 (before surgery). Held after 01/27/19 for further cancer treatment.    11/12/2018 Genetic Testing   Negative genetic testing on the common hereditary cancer panel.  The Hereditary Gene Panel offered by [facility] includes sequencing and/or deletion duplication testing of the following 48 genes: APC, ATM, AXIN2, BARD1, BMPR1A, BRCA1, BRCA2, BRIP1, CDH1, CDK4, CDKN2A (p14ARF), CDKN2A  (p16INK4a), CHEK2, CTNNA1, DICER1, EPCAM (Deletion/duplication testing only), GREM1 (promoter region deletion/duplication testing only), KIT, MEN1, MLH1, MSH2, MSH3, MSH6, MUTYH, NBN, NF1, NHTL1, PALB2, PDGFRA, PMS2, POLD1, POLE, PTEN, RAD50, RAD51C, RAD51D, RNF43, SDHB, SDHC, SDHD, SMAD4, SMARCA4. STK11, TP53, TSC1, TSC2, and VHL.  The following genes were evaluated for sequence changes only: SDHA and HOXB13 c.251G>A variant only. The report date is 11/12/2018.  AXIN2 c.815+fdup ([employer]) VUS identified.  This is still a normal result and will not change medical management.   01/07/2019 Surgery   LEFT BREAST LUMPECTOMY WITH RADIOACTIVE SEED AND LEFT DEEP AXILLARY SENTINEL LYMPH NODE BIOPSY WITH BLUE DYE INJECTION by Dr IDalbert Batman6/2/20    01/07/2019 Pathology Results   Diagnosis 01/07/19 1. Breast, lumpectomy, Left w/seed - INVASIVE LOBULAR CARCINOMA, GRADE 2, 1.5 CM. SEE NOTE - CARCINOMA IS 0.4 CM FROM THE SUPERIOR MARGIN AND 0.6 CM FROM THE INFERIOR MARGIN - NO EVIDENCE OF LYMPHOVASCULAR OR PERINEURAL INVASION - BIOPSY SITE CHANGES - SEE ONCOLOGY TABLE 2. Lymph node, sentinel, biopsy, Left axillary #1 - LYMPH NODE, NEGATIVE FOR CARCINOMA (0/1). SEE NOTE 3. Lymph node, sentinel, biopsy, Left axillary #2 - LYMPH NODE, NEGATIVE FOR CARCINOMA (0/1). SEE NOTE 4. Lymph node, sentinel, biopsy, Left - LYMPH NODE, NEGATIVE FOR CARCINOMA (0/1). SEE NOTE 5. Lymph node, sentinel, biopsy, Left - LYMPH NODE, NEGATIVE FOR CARCINOMA (0/1). SEE NOTE 6. Lymph node, sentinel, biopsy, Left axillary #3 - LYMPH  NODE, NEGATIVE FOR CARCINOMA (0/1). SEE NOTE   01/07/2019 Cancer Staging   Staging form: Breast, AJCC 8th Edition - Pathologic stage from 01/07/2019: Stage IA (pT1c, pN0, cM0, G2, ER+, PR+, HER2-, Oncotype DX score: 33) - Signed by [name], MD on 01/27/2019   01/21/2019 Oncotype testing   RS: 33, 21% distant recurrence risk at 9 years with tamoxifen alone; indicates >15% chemotherapy benefit    02/19/2019 -  05/14/2019 Adjuvant Chemotherapy   Adjuvant TC every 3 weeks for 4 cycles starting 02/19/19. She had allergic reation to Taxol with C2 during infusion. She was able to complete Cytoxan that cycle but not much Taxol. I switched taxol to Abraxane with C3. Completed chemo on 05/14/19.    06/17/2019 - 08/06/2019 Radiation Therapy   Adjuvant Radiation with Dr Sondra Come 06/17/19-08/06/19    08/2019 -  Anti-estrogen oral therapy   Started adjuvant Exemestane 08/2019    10/20/2019 Survivorship   SCP delivered by [name], NP       CURRENT THERAPY:  Anastrozole 47m daily starting 11/2018 (before surgery), held on 01/27/19 for further cancer treatment. Switched to Exemestane in Jan 2021   INTERVAL HISTORY:  Jocelyn PARVEENis here for a follow up of breast cancer. She was last seen by me 6 months ago and seen by NP Lacie 3 months ago in interim. She presents to the clinic alone. She notes she is doing well. She notes she is tolerating Exemestane well. She still has pain everyday of her right wrist without much change but still functional or limit what she does. She is not seeing anyone for this. She denies any other joint pain. She notes having hot flashes with is tolerable. She notes she has weight gain recently. She notes itching and scratching of her right forearm which lead to redness.     REVIEW OF SYSTEMS:   Constitutional: Denies fevers, chills (+) hot flashes (+) Weight gain Eyes: Denies blurriness of vision Ears, nose, mouth, throat, and face: Denies mucositis or sore throat Respiratory: Denies cough, dyspnea or wheezes Cardiovascular: Denies palpitation, chest discomfort or lower extremity swelling Gastrointestinal:  Denies nausea, heartburn or change in bowel habits Skin: Denies abnormal skin rashes (+) Skin itching and scratching of her right forearm which lead to redness. MSK: (+) right wrist pain, stable  Lymphatics: Denies new lymphadenopathy or easy bruising Neurological:Denies  numbness, tingling or new weaknesses Behavioral/Psych: Mood is stable, no new changes  All other systems were reviewed with the patient and are negative.  MEDICAL HISTORY:  Past Medical History:  Diagnosis Date  . Allergy   . Breast cancer ([place] 09/11/14   right breast  . Breast cancer in female ([facility] 01/2019  . Breast cancer of upper-inner quadrant of right [place] breast [place] 09/15/2014  . Family history of breast cancer   . Family history of colon cancer   . Family history of pancreatic cancer   . Hot flashes   . Hyperlipidemia   . Hypothyroidism   . Personal history of chemotherapy 2020   left breast  . Personal history of radiation therapy 2016  . Personal history of radiation therapy 2020  . Radiation 11/25/14-01/06/15   Right Breast    SURGICAL HISTORY: Past Surgical History:  Procedure Laterality Date  . AUGMENTATION MAMMAPLASTY Bilateral 03/10/1997  . BREAST BIOPSY Right 09/11/2014  . BREAST BIOPSY Left 10/21/2018  . BREAST LUMPECTOMY Right 10/20/2014  . BREAST LUMPECTOMY Left 01/11/2019  . BREAST LUMPECTOMY WITH RADIOACTIVE SEED AND SENTINEL LYMPH NODE  BIOPSY N/A 01/07/2019   Procedure: LEFT BREAST LUMPECTOMY WITH RADIOACTIVE SEED AND LEFT DEEP AXILLARY SENTINEL LYMPH NODE BIOPSY WITH BLUE DYE INJECTION;  Surgeon: Fanny Skates, MD;  Location: [facility];  Service: General;  Laterality: N/A;  . COLONOSCOPY    . DILATION AND CURETTAGE OF UTERUS    . KNEE ARTHROSCOPY    . PLACEMENT OF BREAST IMPLANTS  1995   bilat breast implants  . TONSILLECTOMY    . TUBAL LIGATION    . WISDOM TOOTH EXTRACTION      I have reviewed the social history and family history with the patient and they are unchanged from previous note.  ALLERGIES:  is allergic to docetaxel.  MEDICATIONS:  Current Outpatient Medications  Medication Sig Dispense Refill  . Ascorbic Acid (VITAMIN C) 1000 MG tablet Take 2,000 mg by mouth daily.     . Calcium Carb-Cholecalciferol (CALCIUM 600 + D PO) Take 2  tablets by mouth daily.     . cholecalciferol (VITAMIN D3) 25 MCG (1000 UT) tablet Take 1,000 Units by mouth daily.     Marland Kitchen exemestane (AROMASIN) 25 MG tablet Take 1 tablet (25 mg total) by mouth daily after breakfast. 90 tablet 3  . Melatonin 10 MG CAPS Take 20 mg by mouth at bedtime as needed (sleep).     . methimazole (TAPAZOLE) 5 MG tablet Take 5 mg by mouth daily.    . Multiple Vitamin (MULTIVITAMIN WITH MINERALS) TABS tablet Take 1 tablet by mouth daily.     . Naproxen Sod-diphenhydrAMINE (ALEVE PM) 220-25 MG TABS Take 2 tablets by mouth at bedtime as needed (sleep).    . naproxen sodium (ALEVE) 220 MG tablet Take 440 mg by mouth 2 (two) times daily as needed (pain).    Marland Kitchen sertraline (ZOLOFT) 100 MG tablet Take 200 mg by mouth daily.     . simvastatin (ZOCOR) 40 MG tablet Take 40 mg by mouth every evening.    . vitamin E 100 UNIT capsule Take 200 Units by mouth daily.      No current facility-administered medications for this visit.    PHYSICAL EXAMINATION: ECOG PERFORMANCE STATUS: 0 - Asymptomatic  Vitals:   01/19/20 0950 01/19/20 0952  BP: (!) 177/93 (!) 162/82  Pulse: 65   Resp: 18   Temp: 97.9 F (36.6 C)   SpO2: 96%    Filed Weights   01/19/20 0950  Weight: 156 lb 8 oz (71 kg)    GENERAL:alert, no distress and comfortable SKIN: skin color, texture, turgor are normal, no rashes or significant lesions (+) Mild skin erythema of right forearm and hand  EYES: normal, Conjunctiva are pink and non-injected, sclera clear  NECK: supple, thyroid normal size, non-tender, without nodularity LYMPH:  no palpable lymphadenopathy in the cervical, axillary  LUNGS: clear to auscultation and percussion with normal breathing effort HEART: regular rate & rhythm and no murmurs and no lower extremity edema ABDOMEN:abdomen soft, non-tender and normal bowel sounds Musculoskeletal:no cyanosis of digits and no clubbing  NEURO: alert & oriented x 3 with fluent speech, no focal motor/sensory  deficits BREAST: s/p B/l lumpectomy: Surgical incision healed well with mild scar tissue at right breast incision (+) Mild lymphedema of left breast. No palpable mass, nodules or adenopathy bilaterally. Breast exam benign.   LABORATORY DATA:  I have reviewed the data as listed CBC Latest Ref Rng & Units 07/22/2019 05/14/2019 04/23/2019  WBC 4.0 - 10.5 K/uL 5.8 9.3 7.9  Hemoglobin 12.0 - 15.0 g/dL 13.9 12.2 13.2  Hematocrit 36 - 46 % 42.8 37.3 40.7  Platelets 150 - 400 K/uL 227 255 362     CMP Latest Ref Rng & Units 07/22/2019 05/14/2019 04/23/2019  Glucose 70 - 99 mg/dL 105(H) 126(H) 125(H)  BUN 8 - 23 mg/dL 18 17 17   Creatinine 0.44 - 1.00 mg/dL 0.76 0.75 0.77  Sodium 135 - 145 mmol/L 142 143 144  Potassium 3.5 - 5.1 mmol/L 4.3 4.2 3.8  Chloride 98 - 111 mmol/L 103 105 108  CO2 22 - 32 mmol/L 31 28 28   Calcium 8.9 - 10.3 mg/dL 9.7 10.0 9.6  Total Protein 6.5 - 8.1 g/dL 7.0 6.7 6.5  Total Bilirubin 0.3 - 1.2 mg/dL 0.3 0.4 0.3  Alkaline Phos 38 - 126 U/L 88 110 103  AST 15 - 41 U/L 21 19 15   ALT 0 - 44 U/L 21 20 22       RADIOGRAPHIC STUDIES: I have personally reviewed the radiological images as listed and agreed with the findings in the report. No results found.   ASSESSMENT & PLAN:  CHRISTYN GUTKOWSKI is a 69 y.o. female with    1.Malignant neoplasm of upper-inner quadrant of left breast,invasive lobular carcinoma,StageIA,pT1c,N0,M0, ER+/PR+, HER2-, GradeII, RS 33 -She was diagnosed in 10/2018. She started neoadjuvant anastrozole in 4/2020due to the delay of her surgery due to [place] pandemic.This was held after her breast surgery. -She underwent left breast lumpectomy on 01/07/19. Given her RS 33 and high risk disease, she completed 4 cycles of adjvuant chemo TC and adjuvant Radiation.  -She restarted antiestrogen therapy with Exemestane in 08/2019. Tolerating well with stable manageable right wrist pain and hot flashes and weight gain.  -She is clinically doing well. Lab  reviewed, her CBC and CMP are within normal limits except BG 105. Her physical exam and her 10/2019 mammogram were unremarkable except left breast lymphedema and scar tissue, mainly of right breast. There is no clinical concern for recurrence. -After use of BP cuff she had sin erythema of right forearm along with itching. She can use ice pack and hydrocortisone for this.  -Continue surveillance. Next Mammogram in 10/2020 -Continue exemestane. I discussed weight management with healthy diet and exercise.  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 She is s/p rightlumpectomyand SLNB and adjuvant radiation.  -Given her negative ER/PR status, there is no benefit of antiestrogen therapy. Wepreviouslydiscussed chemoprevention for breast cancer in general, and I do not recommend it given her history of ER/PR negative DCIS -Continuesurveillance. No evidence of recurrence.   3. Genetic BRCA plus panelwas negativefor pathogenetic mutations  4. Bone health  -She has not had DEXA scan in years  -Her last DEXA scan was 03/2019, which showed osteopenia in hips. Will continue to monitor every 2 years.  -I She will continue Calcium and Vit D. I also encouraged her to exercise.   PLAN: -Continue Exemestane, refilled today  -I strongly encourage her to exercise for weight loss and bone health  -lab and F/u with NP Lacie in 6 months    No problem-specific Assessment & Plan notes found for this encounter.   No orders of the defined types were placed in this encounter.  All questions were answered. The patient knows to call the clinic with any problems, questions or concerns. No barriers to learning was detected. The total time spent in the appointment was 25 minutes.     [name], MD 01/19/2020   I,  Joslyn Devon, am acting as scribe for [name], MD.   I have reviewed the above documentation for accuracy and completeness, and  I agree with the above.

## 2020-01-20 ENCOUNTER — Telehealth: Payer: Self-pay | Admitting: Hematology

## 2020-01-20 NOTE — Telephone Encounter (Signed)
Scheduled appt per 6/14 los. ° °Spoke with pt and she is aware of the appt date and time. °

## 2020-01-22 DIAGNOSIS — H906 Mixed conductive and sensorineural hearing loss, bilateral: Secondary | ICD-10-CM | POA: Diagnosis not present

## 2020-03-18 DIAGNOSIS — E0590 Thyrotoxicosis, unspecified without thyrotoxic crisis or storm: Secondary | ICD-10-CM | POA: Diagnosis not present

## 2020-03-25 DIAGNOSIS — C50912 Malignant neoplasm of unspecified site of left female breast: Secondary | ICD-10-CM | POA: Diagnosis not present

## 2020-03-25 DIAGNOSIS — E0500 Thyrotoxicosis with diffuse goiter without thyrotoxic crisis or storm: Secondary | ICD-10-CM | POA: Diagnosis not present

## 2020-03-25 DIAGNOSIS — E041 Nontoxic single thyroid nodule: Secondary | ICD-10-CM | POA: Diagnosis not present

## 2020-03-25 DIAGNOSIS — E0590 Thyrotoxicosis, unspecified without thyrotoxic crisis or storm: Secondary | ICD-10-CM | POA: Diagnosis not present

## 2020-03-25 DIAGNOSIS — Z6827 Body mass index (BMI) 27.0-27.9, adult: Secondary | ICD-10-CM | POA: Diagnosis not present

## 2020-04-02 DIAGNOSIS — E041 Nontoxic single thyroid nodule: Secondary | ICD-10-CM | POA: Diagnosis not present

## 2020-04-02 DIAGNOSIS — E7800 Pure hypercholesterolemia, unspecified: Secondary | ICD-10-CM | POA: Diagnosis not present

## 2020-04-02 DIAGNOSIS — E785 Hyperlipidemia, unspecified: Secondary | ICD-10-CM | POA: Diagnosis not present

## 2020-04-02 DIAGNOSIS — E663 Overweight: Secondary | ICD-10-CM | POA: Diagnosis not present

## 2020-04-02 DIAGNOSIS — Z0000 Encounter for general adult medical examination without abnormal findings: Secondary | ICD-10-CM | POA: Diagnosis not present

## 2020-04-02 DIAGNOSIS — E559 Vitamin D deficiency, unspecified: Secondary | ICD-10-CM | POA: Diagnosis not present

## 2020-04-02 DIAGNOSIS — E0590 Thyrotoxicosis, unspecified without thyrotoxic crisis or storm: Secondary | ICD-10-CM | POA: Diagnosis not present

## 2020-04-02 DIAGNOSIS — R7989 Other specified abnormal findings of blood chemistry: Secondary | ICD-10-CM | POA: Diagnosis not present

## 2020-04-02 DIAGNOSIS — M8580 Other specified disorders of bone density and structure, unspecified site: Secondary | ICD-10-CM | POA: Diagnosis not present

## 2020-04-20 DIAGNOSIS — F339 Major depressive disorder, recurrent, unspecified: Secondary | ICD-10-CM | POA: Diagnosis not present

## 2020-04-20 DIAGNOSIS — C50912 Malignant neoplasm of unspecified site of left female breast: Secondary | ICD-10-CM | POA: Diagnosis not present

## 2020-04-20 DIAGNOSIS — E7800 Pure hypercholesterolemia, unspecified: Secondary | ICD-10-CM | POA: Diagnosis not present

## 2020-04-20 DIAGNOSIS — Z853 Personal history of malignant neoplasm of breast: Secondary | ICD-10-CM | POA: Diagnosis not present

## 2020-04-20 DIAGNOSIS — Z0000 Encounter for general adult medical examination without abnormal findings: Secondary | ICD-10-CM | POA: Diagnosis not present

## 2020-04-20 DIAGNOSIS — E559 Vitamin D deficiency, unspecified: Secondary | ICD-10-CM | POA: Diagnosis not present

## 2020-04-20 DIAGNOSIS — E0590 Thyrotoxicosis, unspecified without thyrotoxic crisis or storm: Secondary | ICD-10-CM | POA: Diagnosis not present

## 2020-05-31 DIAGNOSIS — Z124 Encounter for screening for malignant neoplasm of cervix: Secondary | ICD-10-CM | POA: Diagnosis not present

## 2020-05-31 DIAGNOSIS — Z01419 Encounter for gynecological examination (general) (routine) without abnormal findings: Secondary | ICD-10-CM | POA: Diagnosis not present

## 2020-05-31 DIAGNOSIS — Z6830 Body mass index (BMI) 30.0-30.9, adult: Secondary | ICD-10-CM | POA: Diagnosis not present

## 2020-06-08 DIAGNOSIS — R7309 Other abnormal glucose: Secondary | ICD-10-CM | POA: Diagnosis not present

## 2020-06-08 DIAGNOSIS — E041 Nontoxic single thyroid nodule: Secondary | ICD-10-CM | POA: Diagnosis not present

## 2020-06-08 DIAGNOSIS — E0590 Thyrotoxicosis, unspecified without thyrotoxic crisis or storm: Secondary | ICD-10-CM | POA: Diagnosis not present

## 2020-06-08 DIAGNOSIS — Z6827 Body mass index (BMI) 27.0-27.9, adult: Secondary | ICD-10-CM | POA: Diagnosis not present

## 2020-06-08 DIAGNOSIS — E0500 Thyrotoxicosis with diffuse goiter without thyrotoxic crisis or storm: Secondary | ICD-10-CM | POA: Diagnosis not present

## 2020-06-08 DIAGNOSIS — C50912 Malignant neoplasm of unspecified site of left female breast: Secondary | ICD-10-CM | POA: Diagnosis not present

## 2020-07-19 NOTE — Progress Notes (Signed)
[facility]   [phone] [phone]   Clinic Follow up Note   Patient Care Team: [name], DO as PCP - General (Family Medicine) Erroll Luna, MD as Consulting Physician (General Surgery) [name], MD as Consulting Physician (Hematology) Thea Silversmith, MD as Consulting Physician (Radiation Oncology) Rockwell Germany, RN as Registered Nurse Mauro Kaufmann, RN as Registered Nurse Holley Bouche, NP (Inactive) as Nurse Practitioner (Nurse Practitioner) Sylvan Cheese, NP as Nurse Practitioner (Nurse Practitioner) Madelin Rear, MD as Consulting Physician (Endocrinology) Alla Feeling, NP as Nurse Practitioner (Nurse Practitioner) 07/20/2020  CHIEF COMPLAINT: Follow up breast cancer   SUMMARY OF ONCOLOGIC HISTORY: Oncology History Overview Note  Cancer Staging Breast cancer of upper-inner quadrant of right female breast [facility]) Staging form: Breast, AJCC 7th Edition - Clinical stage from 09/23/2014: Stage 0 (Tis (DCIS), N0, M0) - Unsigned - Pathologic stage from 10/22/2014: Stage 0 (Tis (DCIS), N0, cM0) - Signed by [name], MD on 11/03/2014 Staging comments: Staged on final lumpectomy specimen by Dr. [name]  Malignant neoplasm of upper-inner quadrant of left breast in female, estrogen receptor positive ([place]) Staging form: Breast, AJCC 8th Edition - Clinical: cT1b, cN0, cM0, G2, ER+, PR+ - Unsigned    Breast cancer of upper-inner quadrant of right female breast ([place])  09/11/2014 Mammogram   Right breast: area of pleomorphic calcifications and associated architectural distortion, with irregular density at the 1 o'clock position of the right breast. No other suspicious findings in the right breast.    09/11/2014 Breast US   Right breast: area of hypoechogenecity 2 cm x 0.9 cm x 1.6 cm in size, 12 o'clock position, 3 cm the nipple. No discrete suspicious mass.   09/11/2014 Initial Biopsy   Right breast needle core bx  (11-12 o'clock, UOQ): DCIS, grade 3, with necrosis and calcifications, ER- (0%), PR- (0%)   09/11/2014 Pathologic Stage   Stage 0: Tis Nx    09/17/2014 Breast MRI   Non mass enhancement in the upper and slight outer right breast measuring approximately 2.6 x 1.7 x 2.2 cm. Biopsy proven fibrocystic changes in the upper outer periareolar right breast manifested as a 9 x 7 mm mass.   09/24/2014 Procedure   Genetic testing: BRCA plus and BreastNext panels ([place]) performed showing no clinically significant variants detected including ATM, BARD1, BRCA1, BRCA2, BRIP1, CDH1, CHEK2, MRE11A, MUTYH, NBN, NF1, PALB2, PTEN, RAD50, RAD51C, RAD51D, and TP53.   10/20/2014 Definitive Surgery   Right lumpectomy with SLNB ([place]): DCIS with necrosis and calcifications, grade 3, 1 mm from nearest margin (posterior), 0/2 LN with evidence of malignancy.   10/20/2014 Clinical Stage   Stage 0: Tis N0   11/25/2014 - 01/06/2015 Radiation Therapy   Adjuvant RT completed [name]): Right breast 50 Gy over 25 fractions; right breast boost 10 Gy over 5 fractions. Total dose received: 60 Gy.    04/13/2015 Survivorship   Survivorship care plan completed and copy mailed to patient as she was unable to be seen in the Survivorship clinic due to patient's schedule.   11/12/2018 Genetic Testing   Negative genetic testing on the common hereditary cancer panel.  The Hereditary Gene Panel offered by [facility] includes sequencing and/or deletion duplication testing of the following 48 genes: APC, ATM, AXIN2, BARD1, BMPR1A, BRCA1, BRCA2, BRIP1, CDH1, CDK4, CDKN2A (p14ARF), CDKN2A (p16INK4a), CHEK2, CTNNA1, DICER1, EPCAM (Deletion/duplication testing only), GREM1 (promoter region deletion/duplication testing only), KIT, MEN1, MLH1, MSH2, MSH3, MSH6, MUTYH, NBN, NF1, NHTL1, PALB2, PDGFRA, PMS2, POLD1,  POLE, PTEN, RAD50, RAD51C, RAD51D, RNF43, SDHB, SDHC, SDHD, SMAD4, SMARCA4. STK11, TP53, TSC1, TSC2, and VHL.  The following genes were evaluated  for sequence changes only: SDHA and HOXB13 c.251G>A variant only. The report date is 11/12/2018.  AXIN2 c.815+fdup ([employer]) VUS identified.  This is still a normal result and will not change medical management.   Malignant neoplasm of upper-inner quadrant of left breast in female, estrogen receptor positive ([place])  10/18/2018 Initial Diagnosis   Malignant neoplasm of upper-inner quadrant of left breast in female, estrogen receptor positive ([place])   10/18/2018 Initial Biopsy   Diagnosis Breast, left, needle core biopsy, 10 o'clock, 4 cm fn - INVASIVE MAMMARY CARCINOMA - MAMMARY CARCINOMA IN SITU - SEE COMMENT   10/18/2018 Receptors her2   ER: 100% with strong staining  PR: 80% with strong staining  HER2 Negative    10/18/2018 Imaging   Diagnostic Mammogram 10/18/18  IMPRESSION 2 hypoechoic masses in the left breast at 10 o'clock, 4 cm from the nipple. The larger mass measures 5 x 6 x 5 mm. The second mass measures 3 x 5 x 5 mm. The masses are 7 mm apart and span a total dimension 1.7 cm. No axillary adenopathy on the left.   10/18/2018 Cancer Staging   Staging form: Breast, AJCC 8th Edition - Clinical stage from 10/18/2018: Stage IA (cT1b, cN0, cM0, G2, ER+, PR+, HER2-) - Signed by [name], MD on 11/07/2018   10/30/2018 Breast MRI   Breast MRI 10/30/18  IMPRESSION: Biopsy-proven malignancy within the left breast 10 o'clock position. No additional suspicious areas of enhancement identified within either breast.   11/2018 -  Anti-estrogen oral therapy   Anastrozole 34m daily starting 11/2018 (before surgery). Held after 01/27/19 for further cancer treatment.    11/12/2018 Genetic Testing   Negative genetic testing on the common hereditary cancer panel.  The Hereditary Gene Panel offered by [facility] includes sequencing and/or deletion duplication testing of the following 48 genes: APC, ATM, AXIN2, BARD1, BMPR1A, BRCA1, BRCA2, BRIP1, CDH1, CDK4, CDKN2A (p14ARF), CDKN2A (p16INK4a), CHEK2,  CTNNA1, DICER1, EPCAM (Deletion/duplication testing only), GREM1 (promoter region deletion/duplication testing only), KIT, MEN1, MLH1, MSH2, MSH3, MSH6, MUTYH, NBN, NF1, NHTL1, PALB2, PDGFRA, PMS2, POLD1, POLE, PTEN, RAD50, RAD51C, RAD51D, RNF43, SDHB, SDHC, SDHD, SMAD4, SMARCA4. STK11, TP53, TSC1, TSC2, and VHL.  The following genes were evaluated for sequence changes only: SDHA and HOXB13 c.251G>A variant only. The report date is 11/12/2018.  AXIN2 c.815+fdup ([employer]) VUS identified.  This is still a normal result and will not change medical management.   01/07/2019 Surgery   LEFT BREAST LUMPECTOMY WITH RADIOACTIVE SEED AND LEFT DEEP AXILLARY SENTINEL LYMPH NODE BIOPSY WITH BLUE DYE INJECTION by Dr IDalbert Batman6/2/20    01/07/2019 Pathology Results   Diagnosis 01/07/19 1. Breast, lumpectomy, Left w/seed - INVASIVE LOBULAR CARCINOMA, GRADE 2, 1.5 CM. SEE NOTE - CARCINOMA IS 0.4 CM FROM THE SUPERIOR MARGIN AND 0.6 CM FROM THE INFERIOR MARGIN - NO EVIDENCE OF LYMPHOVASCULAR OR PERINEURAL INVASION - BIOPSY SITE CHANGES - SEE ONCOLOGY TABLE 2. Lymph node, sentinel, biopsy, Left axillary #1 - LYMPH NODE, NEGATIVE FOR CARCINOMA (0/1). SEE NOTE 3. Lymph node, sentinel, biopsy, Left axillary #2 - LYMPH NODE, NEGATIVE FOR CARCINOMA (0/1). SEE NOTE 4. Lymph node, sentinel, biopsy, Left - LYMPH NODE, NEGATIVE FOR CARCINOMA (0/1). SEE NOTE 5. Lymph node, sentinel, biopsy, Left - LYMPH NODE, NEGATIVE FOR CARCINOMA (0/1). SEE NOTE 6. Lymph node, sentinel, biopsy, Left axillary #3 - LYMPH NODE, NEGATIVE FOR CARCINOMA (0/1).  SEE NOTE   01/07/2019 Cancer Staging   Staging form: Breast, AJCC 8th Edition - Pathologic stage from 01/07/2019: Stage IA (pT1c, pN0, cM0, G2, ER+, PR+, HER2-, Oncotype DX score: 33) - Signed by [name], MD on 01/27/2019   01/21/2019 Oncotype testing   RS: 33, 21% distant recurrence risk at 9 years with tamoxifen alone; indicates >15% chemotherapy benefit    02/19/2019 - 05/14/2019 Adjuvant  Chemotherapy   Adjuvant TC every 3 weeks for 4 cycles starting 02/19/19. She had allergic reation to Taxol with C2 during infusion. She was able to complete Cytoxan that cycle but not much Taxol. I switched taxol to Abraxane with C3. Completed chemo on 05/14/19.    06/17/2019 - 08/06/2019 Radiation Therapy   Adjuvant Radiation with Dr Sondra Come 06/17/19-08/06/19    08/2019 -  Anti-estrogen oral therapy   Started adjuvant Exemestane 08/2019    10/20/2019 Survivorship   SCP delivered by [name], NP      CURRENT THERAPY: Anastrozole 68m daily starting 11/2018 (before surgery), held on 01/27/19 for further cancer treatment. Switched to Exemestane in Jan 2021  INTERVAL HISTORY: Ms. BGassmannreturns for follow up as scheduled. She was last seen 01/2020.  She continues exemestane, tolerating well without side effects.  Energy and appetite are adequate.  She has stiffness in her hands otherwise no bone or joint pain.  Moderate hot flashes are manageable.  Denies concerns in her breast such as new mass/lump, nipple discharge or inversion, or skin change.  Denies changes in bowel habits, GI/GYN bleeding, fever, chills, cough, chest pain, dyspnea.  She is up-to-date on her vaccines and Pap smear, colonoscopy is not due yet.   MEDICAL HISTORY:  Past Medical History:  Diagnosis Date  . Allergy   . [place]   right breast  . Breast cancer in female ([facility] 01/2019  . Breast cancer of upper[place]inner quadrant of right female breast ([place] 09/15/2014  . Family history of breast cancer   . Family history of colon cancer   . Family history of pancreatic cancer   . Hot flashes   . Hyperlipidemia   . Hypothyroidism   . Personal history of chemotherapy 2020   left breast  . Personal history of radiation therapy 2016  . Personal history of radiation therapy 2020  . Radiation 11/25/14-01/06/15   Right Breast    SURGICAL HISTORY: Past Surgical History:  Procedure Laterality Date  . AUGMENTATION  MAMMAPLASTY Bilateral 03/10/1997  . BREAST BIOPSY Right 09/11/2014  . BREAST BIOPSY Left 10/21/2018  . BREAST LUMPECTOMY Right 10/20/2014  . BREAST LUMPECTOMY Left 01/11/2019  . BREAST LUMPECTOMY WITH RADIOACTIVE SEED AND SENTINEL LYMPH NODE BIOPSY N/A 01/07/2019   Procedure: LEFT BREAST LUMPECTOMY WITH RADIOACTIVE SEED AND LEFT DEEP AXILLARY SENTINEL LYMPH NODE BIOPSY WITH BLUE DYE INJECTION;  Surgeon: IFanny Skates MD;  [facility]  Service: General;  Laterality: N/A;  . COLONOSCOPY    . DILATION AND CURETTAGE OF UTERUS    . KNEE ARTHROSCOPY    . PLACEMENT OF BREAST IMPLANTS  1995   bilat breast implants  . TONSILLECTOMY    . TUBAL LIGATION    . WISDOM TOOTH EXTRACTION      I have reviewed the social history and family history with the patient and they are unchanged from previous note.  ALLERGIES:  is allergic to docetaxel.  MEDICATIONS:  Current Outpatient Medications  Medication Sig Dispense Refill  . Ascorbic Acid (VITAMIN C) 1000 MG tablet Take 2,000 mg by  mouth daily.     . Calcium Carb-Cholecalciferol (CALCIUM 600 + D PO) Take 2 tablets by mouth daily.     . cholecalciferol (VITAMIN D3) 25 MCG (1000 UT) tablet Take 1,000 Units by mouth daily.     Marland Kitchen exemestane (AROMASIN) 25 MG tablet Take 1 tablet (25 mg total) by mouth daily after breakfast. 90 tablet 3  . Melatonin 10 MG CAPS Take 20 mg by mouth at bedtime as needed (sleep).     . methimazole (TAPAZOLE) 5 MG tablet Take 5 mg by mouth daily.    . Multiple Vitamin (MULTIVITAMIN WITH MINERALS) TABS tablet Take 1 tablet by mouth daily.     . Naproxen Sod-diphenhydrAMINE (ALEVE PM) 220-25 MG TABS Take 2 tablets by mouth at bedtime as needed (sleep).    . naproxen sodium (ALEVE) 220 MG tablet Take 440 mg by mouth 2 (two) times daily as needed (pain).    Marland Kitchen sertraline (ZOLOFT) 100 MG tablet Take 200 mg by mouth daily.     . simvastatin (ZOCOR) 40 MG tablet Take 40 mg by mouth every evening.    . vitamin E 100 UNIT capsule  Take 200 Units by mouth daily.      No current facility-administered medications for this visit.    PHYSICAL EXAMINATION: ECOG PERFORMANCE STATUS: 0 - Asymptomatic  Vitals:   07/20/20 1108  BP: 139/80  Pulse: 74  Resp: 17  Temp: 98.3 F (36.8 C)  SpO2: 99%   Filed Weights   07/20/20 1108  Weight: 155 lb 4.8 oz (70.4 kg)    GENERAL:alert, no distress and comfortable SKIN: No rash EYES: sclera clear NECK: Without mass LYMPH:  no palpable cervical, supraclavicular, or axillary lymphadenopathy  LUNGS:  normal breathing effort HEART:  no lower extremity edema NEURO: alert & oriented x 3 with fluent speech, no focal motor/sensory deficits Breast exam: Breasts are symmetric without nipple discharge or inversion.  S/p bilateral lumpectomies, incisions completely healed.  More scar tissue on the right.  No palpable mass in either breast or axilla that I could appreciate  LABORATORY DATA:  I have reviewed the data as listed CBC Latest Ref Rng & Units 07/20/2020 07/22/2019 05/14/2019  WBC 4.0 - 10.5 K/uL 6.2 5.8 9.3  Hemoglobin 12.0 - 15.0 g/dL 14.4 13.9 12.2  Hematocrit 36.0 - 46.0 % 43.6 42.8 37.3  Platelets 150 - 400 K/uL 228 227 255     CMP Latest Ref Rng & Units 07/20/2020 07/22/2019 05/14/2019  Glucose 70 - 99 mg/dL 94 105(H) 126(H)  BUN 8 - 23 mg/dL _0 Creatinine 0.44 - 1.00 mg/dL 0.85 0.76 0.75  Sodium 135 - 145 mmol/L 141 142 143  Potassium 3.5 - 5.1 mmol/L 4.5 4.3 4.2  Chloride 98 - 111 mmol/L 107 103 105  CO2 22 - 32 mmol/L _1 Calcium 8.9 - 10.3 mg/dL 9.2 9.7 10.0  Total Protein 6.5 - 8.1 g/dL 7.0 7.0 6.7  Total Bilirubin 0.3 - 1.2 mg/dL 0.6 0.3 0.4  Alkaline Phos 38 - 126 U/L 93 88 110  AST 15 - 41 U/L _2 ALT 0 - 44 U/L _3 RADIOGRAPHIC STUDIES: I have personally reviewed the radiological images as listed and agreed with the findings in the report. No results found.   ASSESSMENT & PLAN: DERICA LEIBER is a 69 y.o. female  with    1.Malignant neoplasm of upper-inner quadrant of left breast,invasive lobular carcinoma,StageIA,pT1c,N0,M0, ER+/PR+,  HER2-, Levester Fresh, RS 33 -She was diagnosed in 10/2018. She started neoadjuvant anastrozole in 4/2020due to the delay of her surgery due to [place] pandemic.This was held afterher breast surgery. -She underwent left breast lumpectomy on 01/07/19. Given her RS 33 and high risk disease, she completed 4 cycles of adjvuantchemoTC and adjuvant Radiation.  -She restarted antiestrogen therapy with Exemestane in 08/2019.   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 She is s/p rightlumpectomyand SLNB and adjuvant radiation.  -Given her negative ER/PR status, there is no benefit of antiestrogen therapy.  -Continue surveillance  3. Genetic BRCA plus panelwas negativefor pathogenetic mutations  4. Bone health  -DEXA in 03/2019 showed osteopenia in the hips, repeat in 2 years  -Continue calcium, vitamin D, and weightbearing exercise   Disposition: Ms. Valetta Mole is clinically doing well.  She tolerates exemestane with mild joint stiffness and hot flashes, otherwise no significant side effects.  Breast exam is benign, CBC and CMP are normal.  There is no clinical concern for breast cancer recurrence.  She will continue surveillance and exemestane.  Next mammogram with implants in 10/2020.  She will return for routine surveillance follow-up with [place] in 6 months.  Orders Placed This Encounter  Procedures  . MM DIAG BREAST W/IMPLANT BILATERAL    Standing Status:   Future    Standing Expiration Date:   07/20/2021    Order Specific Question:   Reason for Exam (SYMPTOM  OR DIAGNOSIS REQUIRED)    Answer:   h/o R breast DCIS 2016 and L breast lobular carcinoma 2020    Order Specific Question:   Preferred imaging  location?    Answer:   [facility]   All questions were answered. The patient knows to call the clinic with any problems, questions or concerns. No barriers to learning were detected.     Alla Feeling, NP 07/20/20

## 2020-07-20 ENCOUNTER — Encounter: Payer: Self-pay | Admitting: Nurse Practitioner

## 2020-07-20 ENCOUNTER — Inpatient Hospital Stay: Payer: PPO | Attending: Nurse Practitioner | Admitting: Nurse Practitioner

## 2020-07-20 ENCOUNTER — Other Ambulatory Visit: Payer: Self-pay

## 2020-07-20 ENCOUNTER — Telehealth: Payer: Self-pay | Admitting: Nurse Practitioner

## 2020-07-20 ENCOUNTER — Inpatient Hospital Stay: Payer: PPO

## 2020-07-20 VITALS — BP 139/80 | HR 74 | Temp 98.3°F | Resp 17 | Ht 61.0 in | Wt 155.3 lb

## 2020-07-20 DIAGNOSIS — E785 Hyperlipidemia, unspecified: Secondary | ICD-10-CM | POA: Diagnosis not present

## 2020-07-20 DIAGNOSIS — C50212 Malignant neoplasm of upper-inner quadrant of left female breast: Secondary | ICD-10-CM | POA: Diagnosis not present

## 2020-07-20 DIAGNOSIS — Z171 Estrogen receptor negative status [ER-]: Secondary | ICD-10-CM | POA: Diagnosis not present

## 2020-07-20 DIAGNOSIS — Z170 Estrogen receptor positive status [ER+]: Secondary | ICD-10-CM | POA: Insufficient documentation

## 2020-07-20 DIAGNOSIS — Z791 Long term (current) use of non-steroidal anti-inflammatories (NSAID): Secondary | ICD-10-CM | POA: Diagnosis not present

## 2020-07-20 DIAGNOSIS — R232 Flushing: Secondary | ICD-10-CM | POA: Diagnosis not present

## 2020-07-20 DIAGNOSIS — Z803 Family history of malignant neoplasm of breast: Secondary | ICD-10-CM | POA: Insufficient documentation

## 2020-07-20 DIAGNOSIS — Z923 Personal history of irradiation: Secondary | ICD-10-CM | POA: Insufficient documentation

## 2020-07-20 DIAGNOSIS — Z9221 Personal history of antineoplastic chemotherapy: Secondary | ICD-10-CM | POA: Diagnosis not present

## 2020-07-20 DIAGNOSIS — Z800 Family history of malignant neoplasm of digestive organs: Secondary | ICD-10-CM | POA: Insufficient documentation

## 2020-07-20 DIAGNOSIS — M2560 Stiffness of unspecified joint, not elsewhere classified: Secondary | ICD-10-CM | POA: Insufficient documentation

## 2020-07-20 DIAGNOSIS — E039 Hypothyroidism, unspecified: Secondary | ICD-10-CM | POA: Diagnosis not present

## 2020-07-20 DIAGNOSIS — Z79811 Long term (current) use of aromatase inhibitors: Secondary | ICD-10-CM | POA: Diagnosis not present

## 2020-07-20 DIAGNOSIS — C50211 Malignant neoplasm of upper-inner quadrant of right female breast: Secondary | ICD-10-CM | POA: Insufficient documentation

## 2020-07-20 LAB — CBC WITH DIFFERENTIAL (CANCER CENTER ONLY)
Abs Immature Granulocytes: 0.03 10*3/uL (ref 0.00–0.07)
Basophils Absolute: 0 10*3/uL (ref 0.0–0.1)
Basophils Relative: 0 %
Eosinophils Absolute: 0.1 10*3/uL (ref 0.0–0.5)
Eosinophils Relative: 1 %
HCT: 43.6 % (ref 36.0–46.0)
Hemoglobin: 14.4 g/dL (ref 12.0–15.0)
Immature Granulocytes: 1 %
Lymphocytes Relative: 34 %
Lymphs Abs: 2.1 10*3/uL (ref 0.7–4.0)
MCH: 30.1 pg (ref 26.0–34.0)
MCHC: 33 g/dL (ref 30.0–36.0)
MCV: 91.2 fL (ref 80.0–100.0)
Monocytes Absolute: 0.3 10*3/uL (ref 0.1–1.0)
Monocytes Relative: 5 %
Neutro Abs: 3.7 10*3/uL (ref 1.7–7.7)
Neutrophils Relative %: 59 %
Platelet Count: 228 10*3/uL (ref 150–400)
RBC: 4.78 MIL/uL (ref 3.87–5.11)
RDW: 11.9 % (ref 11.5–15.5)
WBC Count: 6.2 10*3/uL (ref 4.0–10.5)
nRBC: 0 % (ref 0.0–0.2)

## 2020-07-20 LAB — CMP (CANCER CENTER ONLY)
ALT: 22 U/L (ref 0–44)
AST: 17 U/L (ref 15–41)
Albumin: 3.9 g/dL (ref 3.5–5.0)
Alkaline Phosphatase: 93 U/L (ref 38–126)
Anion gap: 6 (ref 5–15)
BUN: 18 mg/dL (ref 8–23)
CO2: 28 mmol/L (ref 22–32)
Calcium: 9.2 mg/dL (ref 8.9–10.3)
Chloride: 107 mmol/L (ref 98–111)
Creatinine: 0.85 mg/dL (ref 0.44–1.00)
GFR, Estimated: >60 mL/min (ref >60)
Glucose, Bld: 94 mg/dL (ref 70–99)
Potassium: 4.5 mmol/L (ref 3.5–5.1)
Sodium: 141 mmol/L (ref 135–145)
Total Bilirubin: 0.6 mg/dL (ref 0.3–1.2)
Total Protein: 7 g/dL (ref 6.5–8.1)

## 2020-07-20 NOTE — Telephone Encounter (Signed)
Scheduled appointments per 12/14 los. Spoke to patient who is aware of appointment date and time.

## 2020-08-25 ENCOUNTER — Other Ambulatory Visit: Payer: Self-pay | Admitting: Internal Medicine

## 2020-08-25 DIAGNOSIS — E041 Nontoxic single thyroid nodule: Secondary | ICD-10-CM

## 2020-08-26 ENCOUNTER — Other Ambulatory Visit: Payer: Self-pay | Admitting: Physician Assistant

## 2020-09-02 ENCOUNTER — Ambulatory Visit
Admission: RE | Admit: 2020-09-02 | Discharge: 2020-09-02 | Disposition: A | Discharge status: Home / Self Care | Payer: PPO | Source: Ambulatory Visit | Attending: Internal Medicine | Admitting: Internal Medicine

## 2020-09-02 DIAGNOSIS — E041 Nontoxic single thyroid nodule: Secondary | ICD-10-CM | POA: Diagnosis not present

## 2020-09-14 DIAGNOSIS — M8580 Other specified disorders of bone density and structure, unspecified site: Secondary | ICD-10-CM | POA: Diagnosis not present

## 2020-09-14 DIAGNOSIS — C50912 Malignant neoplasm of unspecified site of left female breast: Secondary | ICD-10-CM | POA: Diagnosis not present

## 2020-09-14 DIAGNOSIS — E0590 Thyrotoxicosis, unspecified without thyrotoxic crisis or storm: Secondary | ICD-10-CM | POA: Diagnosis not present

## 2020-09-14 DIAGNOSIS — E0500 Thyrotoxicosis with diffuse goiter without thyrotoxic crisis or storm: Secondary | ICD-10-CM | POA: Diagnosis not present

## 2020-09-14 DIAGNOSIS — E559 Vitamin D deficiency, unspecified: Secondary | ICD-10-CM | POA: Diagnosis not present

## 2020-09-14 DIAGNOSIS — R7309 Other abnormal glucose: Secondary | ICD-10-CM | POA: Diagnosis not present

## 2020-09-14 DIAGNOSIS — E785 Hyperlipidemia, unspecified: Secondary | ICD-10-CM | POA: Diagnosis not present

## 2020-09-14 DIAGNOSIS — Z6828 Body mass index (BMI) 28.0-28.9, adult: Secondary | ICD-10-CM | POA: Diagnosis not present

## 2020-09-14 DIAGNOSIS — E041 Nontoxic single thyroid nodule: Secondary | ICD-10-CM | POA: Diagnosis not present

## 2020-09-21 DIAGNOSIS — L821 Other seborrheic keratosis: Secondary | ICD-10-CM | POA: Diagnosis not present

## 2020-09-21 DIAGNOSIS — D485 Neoplasm of uncertain behavior of skin: Secondary | ICD-10-CM | POA: Diagnosis not present

## 2020-11-05 ENCOUNTER — Other Ambulatory Visit: Payer: Self-pay | Admitting: Nurse Practitioner

## 2020-11-05 ENCOUNTER — Ambulatory Visit
Admission: RE | Admit: 2020-11-05 | Discharge: 2020-11-05 | Disposition: A | Discharge status: Home / Self Care | Payer: Medicare HMO | Source: Ambulatory Visit | Attending: Nurse Practitioner | Admitting: Nurse Practitioner

## 2020-11-05 ENCOUNTER — Other Ambulatory Visit: Payer: Self-pay

## 2020-11-05 DIAGNOSIS — R922 Inconclusive mammogram: Secondary | ICD-10-CM | POA: Diagnosis not present

## 2020-11-05 DIAGNOSIS — Z853 Personal history of malignant neoplasm of breast: Secondary | ICD-10-CM | POA: Diagnosis not present

## 2020-11-05 DIAGNOSIS — Z170 Estrogen receptor positive status [ER+]: Secondary | ICD-10-CM

## 2020-11-05 DIAGNOSIS — C50212 Malignant neoplasm of upper-inner quadrant of left female breast: Secondary | ICD-10-CM

## 2020-11-12 ENCOUNTER — Other Ambulatory Visit: Payer: Self-pay

## 2020-11-12 ENCOUNTER — Ambulatory Visit
Admission: RE | Admit: 2020-11-12 | Discharge: 2020-11-12 | Disposition: A | Discharge status: Home / Self Care | Payer: Medicare HMO | Source: Ambulatory Visit | Attending: Nurse Practitioner | Admitting: Nurse Practitioner

## 2020-11-12 ENCOUNTER — Other Ambulatory Visit: Payer: Self-pay | Admitting: Nurse Practitioner

## 2020-11-12 DIAGNOSIS — Z170 Estrogen receptor positive status [ER+]: Secondary | ICD-10-CM

## 2020-11-12 DIAGNOSIS — C50212 Malignant neoplasm of upper-inner quadrant of left female breast: Secondary | ICD-10-CM

## 2020-11-23 DIAGNOSIS — D485 Neoplasm of uncertain behavior of skin: Secondary | ICD-10-CM | POA: Diagnosis not present

## 2020-11-23 DIAGNOSIS — L814 Other melanin hyperpigmentation: Secondary | ICD-10-CM | POA: Diagnosis not present

## 2020-11-23 DIAGNOSIS — L821 Other seborrheic keratosis: Secondary | ICD-10-CM | POA: Diagnosis not present

## 2020-11-23 DIAGNOSIS — B079 Viral wart, unspecified: Secondary | ICD-10-CM | POA: Diagnosis not present

## 2020-11-23 DIAGNOSIS — D225 Melanocytic nevi of trunk: Secondary | ICD-10-CM | POA: Diagnosis not present

## 2020-11-23 DIAGNOSIS — D1801 Hemangioma of skin and subcutaneous tissue: Secondary | ICD-10-CM | POA: Diagnosis not present

## 2020-12-06 DIAGNOSIS — E0590 Thyrotoxicosis, unspecified without thyrotoxic crisis or storm: Secondary | ICD-10-CM | POA: Diagnosis not present

## 2020-12-13 DIAGNOSIS — Z6828 Body mass index (BMI) 28.0-28.9, adult: Secondary | ICD-10-CM | POA: Diagnosis not present

## 2020-12-13 DIAGNOSIS — E785 Hyperlipidemia, unspecified: Secondary | ICD-10-CM | POA: Diagnosis not present

## 2020-12-13 DIAGNOSIS — E0500 Thyrotoxicosis with diffuse goiter without thyrotoxic crisis or storm: Secondary | ICD-10-CM | POA: Diagnosis not present

## 2020-12-13 DIAGNOSIS — E0590 Thyrotoxicosis, unspecified without thyrotoxic crisis or storm: Secondary | ICD-10-CM | POA: Diagnosis not present

## 2020-12-13 DIAGNOSIS — E041 Nontoxic single thyroid nodule: Secondary | ICD-10-CM | POA: Diagnosis not present

## 2020-12-13 DIAGNOSIS — R7309 Other abnormal glucose: Secondary | ICD-10-CM | POA: Diagnosis not present

## 2020-12-13 DIAGNOSIS — C50912 Malignant neoplasm of unspecified site of left female breast: Secondary | ICD-10-CM | POA: Diagnosis not present

## 2020-12-17 ENCOUNTER — Other Ambulatory Visit: Payer: Self-pay | Admitting: Surgery

## 2020-12-17 DIAGNOSIS — R921 Mammographic calcification found on diagnostic imaging of breast: Secondary | ICD-10-CM | POA: Diagnosis not present

## 2020-12-30 ENCOUNTER — Encounter (HOSPITAL_BASED_OUTPATIENT_CLINIC_OR_DEPARTMENT_OTHER): Payer: Self-pay | Admitting: Surgery

## 2020-12-30 ENCOUNTER — Other Ambulatory Visit: Payer: Self-pay

## 2021-01-07 MED ORDER — ENSURE PRE-SURGERY PO LIQD: 296.0000 mL | Freq: Once | ORAL | Status: DC

## 2021-01-07 NOTE — Progress Notes (Signed)
      Enhanced Recovery after Surgery for Orthopedics Enhanced Recovery after Surgery is a protocol used to improve the stress on your body and your recovery after surgery.  Patient Instructions  . The night before surgery:  o No food after midnight. ONLY clear liquids after midnight  . The day of surgery (if you do NOT have diabetes):  o Drink ONE (1) Pre-Surgery Clear Ensure as directed.   o This drink was given to you during your hospital  pre-op appointment visit. o The pre-op nurse will instruct you on the time to drink the  Pre-Surgery Ensure depending on your surgery time. o Finish the drink at the designated time by the pre-op nurse.  o Nothing else to drink after completing the  Pre-Surgery Clear Ensure.  . The day of surgery (if you have diabetes): o Drink ONE (1) Gatorade 2 (G2) as directed. o This drink was given to you during your hospital  pre-op appointment visit.  o The pre-op nurse will instruct you on the time to drink the   Gatorade 2 (G2) depending on your surgery time. o Color of the Gatorade may vary. Red is not allowed. o Nothing else to drink after completing the  Gatorade 2 (G2).         If you have questions, please contact your surgeon's office.  Patient given surgical soap with instructions. Patient verbalizes understanding.

## 2021-01-09 NOTE — H&P (Signed)
[name] Appointment: 12/17/2020 11:40 AM Location: [facility] Patient #: [id number] DOB: 1950/09/15 Married / Language: Cleophus Molt / Race: White Female   History of Present Illness (Raahil Ong A. Ninfa Linden MD; 12/17/2020 11:55 AM) The patient is a 70 year old female who presents with a complaint of Breast problems.  Chief complaint: Right breast calcifications  This is a pleasant 70 year old female who had a right breast lumpectomy for DCIS by Dr. Brantley Stage in 2016. She then had a left breast lumpectomy by Dr. 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 Her most recent screening mammography showed abnormal calcifications in the right breast at her previous lumpectomy site there were adjacent to the skin and could not be biopsied by stereotactic guidance. Given her history of DCIS in the breast, surgical excision of this area as been recommended. She has been doing well. Again, she had received radiation to the right breast. She denies nipple discharge.   Past Surgical History Janeann Forehand, CNA; 12/17/2020 11:38 AM) Breast Augmentation  Bilateral. Breast Biopsy  Bilateral, Right. Breast Mass; Local Excision  Bilateral. Colon Polyp Removal - Colonoscopy  Knee Surgery  Left. Oral Surgery  Sentinel Lymph Node Biopsy  Tonsillectomy   Diagnostic Studies History Janeann Forehand, CNA; 12/17/2020 11:38 AM) Colonoscopy  5-10 years ago Mammogram  within last year Pap Smear  1-5 years ago  Allergies Janeann Forehand, CNA; 12/17/2020 11:38 AM) Tamoxifen Citrate *ANTINEOPLASTICS AND ADJUNCTIVE THERAPIES*  Shortness of breath. Allergies Reconciled   Medication History Janeann Forehand, CNA; 12/17/2020 11:38 AM) methIMAzole (5MG  Tablet, Oral) Active. Simvastatin (40MG  Tablet, Oral) Active. Multivitamin Women 50+ (Oral) Active. Biotin (5000MCG Capsule, Oral) Active. KlonoPIN (0.5MG  Tablet, Oral) Active. Sertraline HCl (100MG  Tablet, Oral daily) Active. Anastrozole  (1MG  Tablet, Oral) Active. Medications Reconciled  Social History Janeann Forehand, CNA; 12/17/2020 11:38 AM) Alcohol use  Moderate alcohol use. Caffeine use  Carbonated beverages, Coffee, Tea. No drug use  Tobacco use  Never smoker.  Family History Janeann Forehand, CNA; 12/17/2020 11:38 AM) Alcohol Abuse  Brother, Family Members In General, Mother. Anesthetic complications  Family Members In General. Arthritis  Father, Mother. Bleeding disorder  Family Members In General. Breast Cancer  Father, Mother. Colon Polyps  Father, Mother. Diabetes Mellitus  Family Members In General. Heart Disease  Family Members In General, Mother. Hypertension  Family Members In General, Father, Mother. Kidney Disease  Family Members In General. Malignant Neoplasm Of Pancreas  Family Members In General. Melanoma  Father. Respiratory Condition  Family Members In General.  Pregnancy / Birth History Janeann Forehand, CNA; 12/17/2020 11:38 AM) Age at menarche  31 years, 14 years. Age of menopause  52-60 46-50 Contraceptive History  Oral contraceptives. Gravida  2 Length (months) of breastfeeding  3-6 Maternal age  6-30 Para  2  Other Problems Janeann Forehand, CNA; 12/17/2020 11:38 AM) Arthritis  Bladder Problems  Breast Cancer  Hypercholesterolemia  Lump In Breast  Thyroid Disease     Review of Systems Adriana Reams Alston CNA; 12/17/2020 11:38 AM) General Not Present- Appetite Loss, Chills, Fatigue, Fever, Night Sweats, Weight Gain and Weight Loss. Skin Not Present- Change in Wart/Mole, Dryness, Hives, Jaundice, New Lesions, Non-Healing Wounds, Rash and Ulcer. HEENT Present- Hearing Loss. Not Present- Earache, Hoarseness, Nose Bleed, Oral Ulcers, Ringing in the Ears, Seasonal Allergies, Sinus Pain, Sore Throat, Visual Disturbances, Wears glasses/contact lenses and Yellow Eyes. Respiratory Present- Snoring. Not Present- Bloody sputum, Chronic Cough, Difficulty  Breathing and Wheezing. Breast Not Present- Breast Mass, Breast Pain, Nipple Discharge and Skin Changes. Cardiovascular Not  Present- Chest Pain, Difficulty Breathing Lying Down, Leg Cramps, Palpitations, Rapid Heart Rate, Shortness of Breath and Swelling of Extremities. Gastrointestinal Not Present- Abdominal Pain, Bloating, Bloody Stool, Change in Bowel Habits, Chronic diarrhea, Constipation, Difficulty Swallowing, Excessive gas, Gets full quickly at meals, Hemorrhoids, Indigestion, Nausea, Rectal Pain and Vomiting. Female Genitourinary Present- Frequency and Urgency. Not Present- Nocturia, Painful Urination and Pelvic Pain. Musculoskeletal Not Present- Back Pain, Joint Pain, Joint Stiffness, Muscle Pain, Muscle Weakness and Swelling of Extremities. Neurological Not Present- Decreased Memory, Fainting, Headaches, Numbness, Seizures, Tingling, Tremor, Trouble walking and Weakness. Psychiatric Not Present- Anxiety, Bipolar, Change in Sleep Pattern, Depression, Fearful and Frequent crying. Endocrine Present- Hot flashes. Not Present- Cold Intolerance, Excessive Hunger, Hair Changes, Heat Intolerance and New Diabetes.   Physical Exam (Keyton Bhat A. Ninfa Linden MD; 12/17/2020 11:56 AM) The physical exam findings are as follows: Note: She appears well and exam  Right breast lumpectomy incision is at the 12 o'clock position. It is hard and firm but there are no skin changes and no palpable masses. The nipple areolar complex is normal. There is no axillary adenopathy.  Lungs clear CV RRR Abdomen soft, NT Neuro grossly intact    Assessment & Plan   BREAST CALCIFICATION, RIGHT (R92.1)  Impression: I reviewed her notes in the electronic medical records. I reviewed the most recent mammograms. Again, she has abnormal calcifications in the right breast at the lumpectomy site adjacent to the skin so biopsy cannot be performed by radiology. Removal of these would require a simple lumpectomy with removal of  her previous scar and underlying breast tissue on the right breast. This is strongly recommended given her previous history of DCIS in that breast. These calcifications are worrisome for recurrence. I discussed the surgical procedure with her in detail. We discussed the risk which includes but is not limited to bleeding, infection, poor wound healing given her previous radiation, the need for further surgery if malignancy is found, etc. She understands. Surgery will be scheduled

## 2021-01-10 ENCOUNTER — Ambulatory Visit (HOSPITAL_BASED_OUTPATIENT_CLINIC_OR_DEPARTMENT_OTHER): Payer: Medicare HMO | Admitting: Anesthesiology

## 2021-01-10 ENCOUNTER — Other Ambulatory Visit: Payer: Self-pay

## 2021-01-10 ENCOUNTER — Ambulatory Visit (HOSPITAL_BASED_OUTPATIENT_CLINIC_OR_DEPARTMENT_OTHER)
Admission: RE | Admit: 2021-01-10 | Discharge: 2021-01-10 | Disposition: A | Discharge status: Home / Self Care | Payer: Medicare HMO | Attending: Surgery | Admitting: Surgery

## 2021-01-10 ENCOUNTER — Encounter (HOSPITAL_BASED_OUTPATIENT_CLINIC_OR_DEPARTMENT_OTHER)
Admission: RE | Disposition: A | Discharge status: Home / Self Care | Payer: Self-pay | Source: Home / Self Care | Attending: Surgery

## 2021-01-10 ENCOUNTER — Encounter (HOSPITAL_BASED_OUTPATIENT_CLINIC_OR_DEPARTMENT_OTHER): Payer: Self-pay | Admitting: Surgery

## 2021-01-10 DIAGNOSIS — Z923 Personal history of irradiation: Secondary | ICD-10-CM | POA: Insufficient documentation

## 2021-01-10 DIAGNOSIS — N6011 Diffuse cystic mastopathy of right breast: Secondary | ICD-10-CM | POA: Diagnosis not present

## 2021-01-10 DIAGNOSIS — Z79899 Other long term (current) drug therapy: Secondary | ICD-10-CM | POA: Insufficient documentation

## 2021-01-10 DIAGNOSIS — E039 Hypothyroidism, unspecified: Secondary | ICD-10-CM | POA: Diagnosis not present

## 2021-01-10 DIAGNOSIS — E785 Hyperlipidemia, unspecified: Secondary | ICD-10-CM | POA: Diagnosis not present

## 2021-01-10 DIAGNOSIS — C50912 Malignant neoplasm of unspecified site of left female breast: Secondary | ICD-10-CM | POA: Insufficient documentation

## 2021-01-10 DIAGNOSIS — Z79811 Long term (current) use of aromatase inhibitors: Secondary | ICD-10-CM | POA: Diagnosis not present

## 2021-01-10 DIAGNOSIS — Z888 Allergy status to other drugs, medicaments and biological substances status: Secondary | ICD-10-CM | POA: Insufficient documentation

## 2021-01-10 DIAGNOSIS — N6489 Other specified disorders of breast: Secondary | ICD-10-CM | POA: Diagnosis not present

## 2021-01-10 DIAGNOSIS — R921 Mammographic calcification found on diagnostic imaging of breast: Secondary | ICD-10-CM | POA: Diagnosis not present

## 2021-01-10 HISTORY — DX: Thyrotoxicosis, unspecified without thyrotoxic crisis or storm: E05.90

## 2021-01-10 HISTORY — PX: BREAST LUMPECTOMY: SHX2

## 2021-01-10 SURGERY — BREAST LUMPECTOMY
Anesthesia: General | Site: Breast | Laterality: Right

## 2021-01-10 MED ORDER — MIDAZOLAM HCL 2 MG/2ML IJ SOLN
INTRAMUSCULAR | Status: AC
  Filled 2021-01-10: qty 2

## 2021-01-10 MED ORDER — ACETAMINOPHEN 500 MG PO TABS
1000.0000 mg | ORAL_TABLET | ORAL | Status: AC
  Administered 2021-01-10: 1000 mg via ORAL

## 2021-01-10 MED ORDER — DEXAMETHASONE SODIUM PHOSPHATE 10 MG/ML IJ SOLN
INTRAMUSCULAR | Status: DC | PRN
  Administered 2021-01-10: 5 mg via INTRAVENOUS

## 2021-01-10 MED ORDER — PROPOFOL 10 MG/ML IV BOLUS
INTRAVENOUS | Status: AC
  Filled 2021-01-10: qty 20

## 2021-01-10 MED ORDER — FENTANYL CITRATE (PF) 100 MCG/2ML IJ SOLN
INTRAMUSCULAR | Status: DC | PRN
  Administered 2021-01-10: 50 ug via INTRAVENOUS

## 2021-01-10 MED ORDER — TRAMADOL HCL 50 MG PO TABS: 50.0000 mg | ORAL_TABLET | Freq: Four times a day (QID) | ORAL | 0 refills | Status: DC | PRN

## 2021-01-10 MED ORDER — OXYCODONE HCL 5 MG/5ML PO SOLN: 5.0000 mg | Freq: Once | ORAL | Status: DC | PRN

## 2021-01-10 MED ORDER — CEFAZOLIN SODIUM-DEXTROSE 2-4 GM/100ML-% IV SOLN
2.0000 g | INTRAVENOUS | Status: AC
  Administered 2021-01-10: 2 g via INTRAVENOUS

## 2021-01-10 MED ORDER — CEFAZOLIN SODIUM-DEXTROSE 2-4 GM/100ML-% IV SOLN
INTRAVENOUS | Status: AC
  Filled 2021-01-10: qty 100

## 2021-01-10 MED ORDER — ONDANSETRON HCL 4 MG/2ML IJ SOLN
INTRAMUSCULAR | Status: DC | PRN
  Administered 2021-01-10: 4 mg via INTRAVENOUS

## 2021-01-10 MED ORDER — CHLORHEXIDINE GLUCONATE CLOTH 2 % EX PADS: 6.0000 | MEDICATED_PAD | Freq: Once | CUTANEOUS | Status: DC

## 2021-01-10 MED ORDER — BUPIVACAINE-EPINEPHRINE 0.5% -1:200000 IJ SOLN
INTRAMUSCULAR | Status: DC | PRN
  Administered 2021-01-10: 20 mL

## 2021-01-10 MED ORDER — FENTANYL CITRATE (PF) 100 MCG/2ML IJ SOLN
INTRAMUSCULAR | Status: AC
  Filled 2021-01-10: qty 2

## 2021-01-10 MED ORDER — PROPOFOL 10 MG/ML IV BOLUS
INTRAVENOUS | Status: DC | PRN
  Administered 2021-01-10: 140 mg via INTRAVENOUS

## 2021-01-10 MED ORDER — OXYCODONE HCL 5 MG PO TABS: 5.0000 mg | ORAL_TABLET | Freq: Once | ORAL | Status: DC | PRN

## 2021-01-10 MED ORDER — ONDANSETRON HCL 4 MG/2ML IJ SOLN: 4.0000 mg | Freq: Once | INTRAMUSCULAR | Status: DC | PRN

## 2021-01-10 MED ORDER — FENTANYL CITRATE (PF) 100 MCG/2ML IJ SOLN: 25.0000 ug | INTRAMUSCULAR | Status: DC | PRN

## 2021-01-10 MED ORDER — ACETAMINOPHEN 500 MG PO TABS
ORAL_TABLET | ORAL | Status: AC
  Filled 2021-01-10: qty 2

## 2021-01-10 MED ORDER — LACTATED RINGERS IV SOLN: INTRAVENOUS | Status: DC

## 2021-01-10 MED ORDER — LIDOCAINE HCL (CARDIAC) PF 100 MG/5ML IV SOSY
PREFILLED_SYRINGE | INTRAVENOUS | Status: DC | PRN
  Administered 2021-01-10: 50 mg via INTRATRACHEAL

## 2021-01-10 MED ORDER — MIDAZOLAM HCL 5 MG/5ML IJ SOLN
INTRAMUSCULAR | Status: DC | PRN
  Administered 2021-01-10: 2 mg via INTRAVENOUS

## 2021-01-10 MED ORDER — ONDANSETRON HCL 4 MG/2ML IJ SOLN
INTRAMUSCULAR | Status: AC
  Filled 2021-01-10: qty 2

## 2021-01-10 SURGICAL SUPPLY — 39 items
ADH SKN CLS APL DERMABOND .7 (GAUZE/BANDAGES/DRESSINGS) ×1
APL PRP STRL LF DISP 70% ISPRP (MISCELLANEOUS) ×1
BLADE SURG 15 STRL LF DISP TIS (BLADE) ×1 IMPLANT
BLADE SURG 15 STRL SS (BLADE) ×2
CANISTER SUCT 1200ML W/VALVE (MISCELLANEOUS) IMPLANT
CHLORAPREP W/TINT 26 (MISCELLANEOUS) ×2 IMPLANT
CLIP VESOCCLUDE SM WIDE 6/CT (CLIP) IMPLANT
COVER BACK TABLE 60X90IN (DRAPES) ×2 IMPLANT
COVER MAYO STAND STRL (DRAPES) ×2 IMPLANT
COVER WAND RF STERILE (DRAPES) IMPLANT
DECANTER SPIKE VIAL GLASS SM (MISCELLANEOUS) IMPLANT
DERMABOND ADVANCED (GAUZE/BANDAGES/DRESSINGS) ×1
DERMABOND ADVANCED .7 DNX12 (GAUZE/BANDAGES/DRESSINGS) ×1 IMPLANT
DRAPE LAPAROTOMY 100X72 PEDS (DRAPES) ×2 IMPLANT
DRAPE UTILITY XL STRL (DRAPES) ×2 IMPLANT
ELECT REM PT RETURN 9FT ADLT (ELECTROSURGICAL) ×2
ELECTRODE REM PT RTRN 9FT ADLT (ELECTROSURGICAL) ×1 IMPLANT
GAUZE SPONGE 4X4 12PLY STRL LF (GAUZE/BANDAGES/DRESSINGS) ×2 IMPLANT
GLOVE SURG SIGNA 7.5 PF LTX (GLOVE) ×2 IMPLANT
GOWN STRL REUS W/ TWL LRG LVL3 (GOWN DISPOSABLE) ×1 IMPLANT
GOWN STRL REUS W/ TWL XL LVL3 (GOWN DISPOSABLE) ×1 IMPLANT
GOWN STRL REUS W/TWL LRG LVL3 (GOWN DISPOSABLE) ×2
GOWN STRL REUS W/TWL XL LVL3 (GOWN DISPOSABLE) ×2
KIT MARKER MARGIN INK (KITS) ×2 IMPLANT
NEEDLE HYPO 25X1 1.5 SAFETY (NEEDLE) ×2 IMPLANT
NS IRRIG 1000ML POUR BTL (IV SOLUTION) ×2 IMPLANT
PACK BASIN DAY SURGERY FS (CUSTOM PROCEDURE TRAY) ×2 IMPLANT
PENCIL SMOKE EVACUATOR (MISCELLANEOUS) ×2 IMPLANT
SLEEVE SCD COMPRESS KNEE MED (STOCKING) IMPLANT
SPONGE LAP 4X18 RFD (DISPOSABLE) ×2 IMPLANT
SUT MNCRL AB 4-0 PS2 18 (SUTURE) ×2 IMPLANT
SUT SILK 2 0 SH (SUTURE) IMPLANT
SUT VIC AB 3-0 SH 27 (SUTURE) ×4
SUT VIC AB 3-0 SH 27X BRD (SUTURE) ×2 IMPLANT
SYR CONTROL 10ML LL (SYRINGE) ×2 IMPLANT
TOWEL GREEN STERILE FF (TOWEL DISPOSABLE) ×2 IMPLANT
TRAY FAXITRON CT DISP (TRAY / TRAY PROCEDURE) IMPLANT
TUBE CONNECTING 20X1/4 (TUBING) IMPLANT
YANKAUER SUCT BULB TIP NO VENT (SUCTIONS) IMPLANT

## 2021-01-10 NOTE — Anesthesia Procedure Notes (Signed)
Procedure Name: LMA Insertion Date/Time: 01/10/2021 9:00 AM Performed by: Glory Buff, CRNA Pre-anesthesia Checklist: Patient identified, Emergency Drugs available, Suction available and Patient being monitored Patient Re-evaluated:Patient Re-evaluated prior to induction Oxygen Delivery Method: Circle system utilized Preoxygenation: Pre-oxygenation with 100% oxygen Induction Type: IV induction LMA: LMA inserted LMA Size: 4.0 Number of attempts: 1 Placement Confirmation: positive ETCO2 Tube secured with: Tape Dental Injury: Teeth and Oropharynx as per pre-operative assessment

## 2021-01-10 NOTE — Transfer of Care (Signed)
Immediate Anesthesia Transfer of Care Note  Patient: GABRIELLA WOODHEAD  Procedure(s) Performed: RIGHT BREAST LUMPECTOMY (Right Breast)  Patient Location: PACU  Anesthesia Type:General  Level of Consciousness: drowsy and patient cooperative  Airway & Oxygen Therapy: Patient Spontanous Breathing and Patient connected to face mask oxygen  Post-op Assessment: Report given to RN and Post -op Vital signs reviewed and stable  Post vital signs: Reviewed and stable  Last Vitals:  Vitals Value Taken Time  BP    Temp    Pulse 68 01/10/21 0933  Resp 10 01/10/21 0933  SpO2 99 % 01/10/21 0933  Vitals shown include unvalidated device data.  Last Pain:  Vitals:   01/10/21 0830  TempSrc: Oral  PainSc: 0-No pain      Patients Stated Pain Goal: 7 (70/14/10 3013)  Complications: No complications documented.

## 2021-01-10 NOTE — Op Note (Signed)
Preop diagnosis: Right breast abnormal calcifications Postop diagnosis: Same Procedure performed: Lumpectomy  Surgeon: Coralie Keens, MD Assistant: Mosie Lukes, MD Anesthesia: Gen. 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 She then had a left breast lumpectomy by Dr. 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 Her most recent screening mammography showed abnormal calcifications in the right breast at her previous lumpectomy site there were adjacent to the skin and could not be biopsied by stereotactic guidance. 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 After an adequate level of general anesthesia was obtained, her right breast was prepped with ChloraPrep and draped in sterile fashion. A timeout was taken to ensure the proper patient and proper procedure. We identified the prior lumpectomy scar overlying an area of firmness in the right breast. We infiltrated the skin around the scar with 0.25% Marcaine with epinephrine. We made an elliptical incision around the scar. We dissected down into the breast tissue approximately 1-2cm deep to the skin with cautery. The specimen was removed and oriented with a paint kit. This was sent for pathologic examination. Hemostasis was confirmed. The wound was closed with a deep layer of 3-0 Vicryl and a subcuticular layer of 4-0 Monocryl. Dermabond was applied. The patient was then extubated and brought to the recovery room in stable condition. All sponge, instrument, and needle counts are correct.

## 2021-01-10 NOTE — Anesthesia Postprocedure Evaluation (Signed)
Anesthesia Post Note  Patient: [name]  Procedure(s) Performed: RIGHT BREAST LUMPECTOMY (Right Breast)     Patient location during evaluation: PACU Anesthesia Type: General Level of consciousness: awake and alert and oriented Pain management: pain level controlled Vital Signs Assessment: post-procedure vital signs reviewed and stable Respiratory status: spontaneous breathing, nonlabored ventilation and respiratory function stable Cardiovascular status: blood pressure returned to baseline and stable Postop Assessment: no apparent nausea or vomiting Anesthetic complications: no   No complications documented.  Last Vitals:  Vitals:   01/10/21 0949 01/10/21 1000  BP:  140/68  Pulse:  71  Resp:  20  Temp:    SpO2: 96% 93%    Last Pain:  Vitals:   01/10/21 1000  TempSrc:   PainSc: 0-No pain                 Katonya Blecher A.

## 2021-01-10 NOTE — Interval H&P Note (Signed)
History and Physical Interval Note: no change in H and P  01/10/2021 8:34 AM  [name]  has presented today for surgery, with the diagnosis of ABNORMAL CALCIFICATIONS RIGHT BREAST.  The various methods of treatment have been discussed with the patient and family. After consideration of risks, benefits and other options for treatment, the patient has consented to  Procedure(s): RIGHT BREAST LUMPECTOMY (Right) as a surgical intervention.  The patient's history has been reviewed, patient examined, no change in status, stable for surgery.  I have reviewed the patient's chart and labs.  Questions were answered to the patient's satisfaction.     Coralie Keens

## 2021-01-10 NOTE — Discharge Instructions (Signed)
Next dose of Tylenol at 2:45 PM   [employer] Phone Number [phone]  BREAST BIOPSY/ PARTIAL MASTECTOMY: POST OP INSTRUCTIONS  Always review your discharge instruction sheet given to you by the facility where your surgery was performed.  IF YOU HAVE DISABILITY OR FAMILY LEAVE FORMS, YOU MUST BRING THEM TO THE OFFICE FOR PROCESSING.  DO NOT GIVE THEM TO YOUR DOCTOR.  1. A prescription for pain medication may be given to you upon discharge.  Take your pain medication as prescribed, if needed.  If narcotic pain medicine is not needed, then you may take acetaminophen (Tylenol) or ibuprofen (Advil) as needed. 2. Take your usually prescribed medications unless otherwise directed 3. If you need a refill on your pain medication, please contact your pharmacy.  They will contact our office to request authorization.  Prescriptions will not be filled after 5pm or on week-ends. 4. You should eat very light the first 24 hours after surgery, such as soup, crackers, pudding, etc.  Resume your normal diet the day after surgery. 5. Most patients will experience some swelling and bruising in the breast.  Ice packs and a good support bra will help.  Swelling and bruising can take several days to resolve.  6. It is common to experience some constipation if taking pain medication after surgery.  Increasing fluid intake and taking a stool softener will usually help or prevent this problem from occurring.  A mild laxative (Milk of Magnesia or Miralax) should be taken according to package directions if there are no bowel movements after 48 hours. 7. Unless discharge instructions indicate otherwise, you may remove your bandages 24-48 hours after surgery, and you may shower at that time.  You may have steri-strips (small skin tapes) in place directly over the incision.  These strips should be left on the skin for 7-10 days.  If your surgeon used skin glue on the incision, you may shower in 24 hours.   The glue will flake off over the next 2-3 weeks.  Any sutures or staples will be removed at the office during your follow-up visit. 8. ACTIVITIES:  You may resume regular daily activities (gradually increasing) beginning the next day.  Wearing a good support bra or sports bra minimizes pain and swelling.  You may have sexual intercourse when it is comfortable. a. You may drive when you no longer are taking prescription pain medication, you can comfortably wear a seatbelt, and you can safely maneuver your car and apply brakes. b. RETURN TO WORK:  ______________________________________________________________________________________ 9. You should see your doctor in the office for a follow-up appointment approximately two weeks after your surgery.  Your doctor's nurse will typically make your follow-up appointment when she calls you with your pathology report.  Expect your pathology report 2-3 business days after your surgery.  You may call to check if you do not hear from [place] after three days. 10. OTHER INSTRUCTIONS:OK TO SHOWER STARTING TOMORROW 11. ICE PACK, TYLENOL, AND IBUPROFEN ALSO FOR PAIN 12.  ___NO [employer] ACTIVITY FOR ONE WEEK 13. ____________________________________________________________________________________________ ____________________________________________________________________________________________________________________________________ _____________________________________________________________________________________________________________________________________ _____________________________________________________________________________________________________________________________________  WHEN TO CALL YOUR DOCTOR: 1. Fever over 101.0 2. Nausea and/or vomiting. 3. Extreme swelling or bruising. 4. Continued bleeding from incision. 5. Increased pain, redness, or drainage from the incision.  The clinic staff is available to answer your questions during regular  business hours.  Please don't hesitate to call and ask to speak to one of the nurses for clinical concerns.  If you have a medical emergency,  go to the nearest emergency room or call 911.  A surgeon from [facility] is always on call at the hospital.  For further questions, please visit [email or web address]     Post Anesthesia Home Care Instructions  Activity: Get plenty of rest for the remainder of the day. A responsible individual must stay with you for 24 hours following the procedure.  For the next 24 hours, DO NOT: -Drive a car -Paediatric nurse -Drink alcoholic beverages -Take any medication unless instructed by your physician -Make any legal decisions or sign important papers.  Meals: Start with liquid foods such as gelatin or soup. Progress to regular foods as tolerated. Avoid greasy, spicy, heavy foods. If nausea and/or vomiting occur, drink only clear liquids until the nausea and/or vomiting subsides. Call your physician if vomiting continues.  Special Instructions/Symptoms: Your throat may feel dry or sore from the anesthesia or the breathing tube placed in your throat during surgery. If this causes discomfort, gargle with warm salt water. The discomfort should disappear within 24 hours.  If you had a scopolamine patch placed behind your ear for the management of post- operative nausea and/or vomiting:  1. The medication in the patch is effective for 72 hours, after which it should be removed.  Wrap patch in a tissue and discard in the trash. Wash hands thoroughly with soap and water. 2. You may remove the patch earlier than 72 hours if you experience unpleasant side effects which may include dry mouth, dizziness or visual disturbances. 3. Avoid touching the patch. Wash your hands with soap and water after contact with the patch.

## 2021-01-10 NOTE — Anesthesia Preprocedure Evaluation (Addendum)
Anesthesia Evaluation  Patient identified by MRN, date of birth, ID band Patient awake    Reviewed: Allergy & Precautions, NPO status , Patient's Chart, lab work & pertinent test results  Airway Mallampati: II  TM Distance: >3 FB Neck ROM: Full    Dental  (+) Teeth Intact, Dental Advisory Given   Pulmonary neg pulmonary ROS,    Pulmonary exam normal breath sounds clear to auscultation       Cardiovascular negative cardio ROS Normal cardiovascular exam Rhythm:Regular Rate:Normal     Neuro/Psych negative neurological ROS  negative psych ROS   GI/Hepatic negative GI ROS, Neg liver ROS,   Endo/Other  Hyperthyroidism Hypercholesterolemia Hx/o Bilateral Breast Ca S/P ChemoRx and RT Currently on hormonal suppression therapy Abnormal calcification Right Breast Takes Tapazole for Hyperthyroidism  Renal/GU negative Renal ROS  negative genitourinary   Musculoskeletal negative musculoskeletal ROS (+)   Abdominal   Peds  Hematology   Anesthesia Other Findings   Reproductive/Obstetrics                                                             Anesthesia Evaluation  Patient identified by MRN, date of birth, ID band Patient awake    Reviewed: Allergy & Precautions, NPO status , Patient's Chart, lab work & pertinent test results  Airway Mallampati: II  TM Distance: <3 FB Neck ROM: Full    Dental no notable dental hx. (+) Teeth Intact, Dental Advisory Given   Pulmonary neg pulmonary ROS,    Pulmonary exam normal breath sounds clear to auscultation       Cardiovascular negative cardio ROS Normal cardiovascular exam Rhythm:Regular Rate:Normal     Neuro/Psych PSYCHIATRIC DISORDERS negative neurological ROS     GI/Hepatic negative GI ROS, Neg liver ROS,   Endo/Other  Hypothyroidism   Renal/GU negative Renal ROS     Musculoskeletal negative musculoskeletal ROS (+)    Abdominal   Peds  Hematology HLD   Anesthesia Other Findings LEFT BREAST CANCER  Reproductive/Obstetrics                           Anesthesia Physical Anesthesia Plan  ASA: II  Anesthesia Plan: General and Regional   Post-op Pain Management: GA combined w/ Regional for post-op pain   Induction: Intravenous  PONV Risk Score and Plan: 3 and Ondansetron, Dexamethasone, Midazolam and Treatment may vary due to age or medical condition  Airway Management Planned: LMA  Additional Equipment:   Intra-op Plan:   Post-operative Plan: Extubation in OR  Informed Consent: I have reviewed the patients History and Physical, chart, labs and discussed the procedure including the risks, benefits and alternatives for the proposed anesthesia with the patient or authorized representative who has indicated his/her understanding and acceptance.     Dental advisory given  Plan Discussed with: CRNA  Anesthesia Plan Comments:        Anesthesia Quick Evaluation  Anesthesia Physical Anesthesia Plan  ASA: II  Anesthesia Plan: General   Post-op Pain Management:    Induction: Intravenous  PONV Risk Score and Plan: 4 or greater and Treatment may vary due to age or medical condition, Ondansetron and Dexamethasone  Airway Management Planned: LMA  Additional Equipment:   Intra-op Plan:   Post-operative Plan: Extubation in  OR  Informed Consent: I have reviewed the patients History and Physical, chart, labs and discussed the procedure including the risks, benefits and alternatives for the proposed anesthesia with the patient or authorized representative who has indicated his/her understanding and acceptance.     Dental advisory given  Plan Discussed with: CRNA and Anesthesiologist  Anesthesia Plan Comments:        Anesthesia Quick Evaluation

## 2021-01-11 ENCOUNTER — Encounter (HOSPITAL_BASED_OUTPATIENT_CLINIC_OR_DEPARTMENT_OTHER): Payer: Self-pay | Admitting: Surgery

## 2021-01-11 LAB — SURGICAL PATHOLOGY

## 2021-01-14 NOTE — Progress Notes (Addendum)
[place]   [phone] [phone]   Clinic Follow up Note   Patient Care Team: [name], DO as PCP - General (Family Medicine) Erroll Luna, MD as Consulting Physician (General Surgery) [name], MD as Consulting Physician (Hematology) Thea Silversmith, MD as Consulting Physician (Radiation Oncology) Rockwell Germany, RN as Registered Nurse Mauro Kaufmann, RN as Registered Nurse Holley Bouche, NP (Inactive) as Nurse Practitioner (Nurse Practitioner) Sylvan Cheese, NP as Nurse Practitioner (Nurse Practitioner) Madelin Rear, MD as Consulting Physician (Endocrinology) Alla Feeling, NP as Nurse Practitioner (Nurse Practitioner)  Date of Service:  01/19/2021  CHIEF COMPLAINT: F/u of right and left breast cancer   SUMMARY OF ONCOLOGIC HISTORY: Oncology History Overview Note  Cancer Staging Breast cancer of upper-inner quadrant of right female breast [facility]) Staging form: Breast, AJCC 7th Edition - Clinical stage from 09/23/2014: Stage 0 (Tis (DCIS), N0, M0) - Unsigned - Pathologic stage from 10/22/2014: Stage 0 (Tis (DCIS), N0, cM0) - Signed by [name], MD on 11/03/2014 Staging comments: Staged on final lumpectomy specimen by Dr. [name]  Malignant neoplasm of upper-inner quadrant of left breast in female, estrogen receptor positive ([place]) Staging form: Breast, AJCC 8th Edition - Clinical: cT1b, cN0, cM0, G2, ER+, PR+ - Unsigned    Breast cancer of upper-inner quadrant of right female breast ([place])  09/11/2014 Mammogram   Right breast: area of pleomorphic calcifications and associated architectural distortion, with irregular density at the 1 o'clock position of the right breast. No other suspicious findings in the right breast.     09/11/2014 Breast US   Right breast: area of hypoechogenecity 2 cm x 0.9 cm x 1.6 cm in size, 12 o'clock position, 3 cm the nipple. No discrete suspicious mass.    09/11/2014 Initial Biopsy    Right breast needle core bx (11-12 o'clock, UOQ): DCIS, grade 3, with necrosis and calcifications, ER- (0%), PR- (0%)    09/11/2014 Pathologic Stage   Stage 0: Tis Nx     09/17/2014 Breast MRI   Non mass enhancement in the upper and slight outer right breast measuring approximately 2.6 x 1.7 x 2.2 cm. Biopsy proven fibrocystic changes in the upper outer periareolar right breast manifested as a 9 x 7 mm mass.    09/24/2014 Procedure   Genetic testing: BRCA plus and BreastNext panels ([place]) performed showing no clinically significant variants detected including ATM, BARD1, BRCA1, BRCA2, BRIP1, CDH1, CHEK2, MRE11A, MUTYH, NBN, NF1, PALB2, PTEN, RAD50, RAD51C, RAD51D, and TP53.    10/20/2014 Definitive Surgery   Right lumpectomy with SLNB ([place]): DCIS with necrosis and calcifications, grade 3, 1 mm from nearest margin (posterior), 0/2 LN with evidence of malignancy.    10/20/2014 Clinical Stage   Stage 0: Tis N0    11/25/2014 - 01/06/2015 Radiation Therapy   Adjuvant RT completed [name]): Right breast 50 Gy over 25 fractions; right breast boost 10 Gy over 5 fractions. Total dose received: 60 Gy.     04/13/2015 Survivorship   Survivorship care plan completed and copy mailed to patient as she was unable to be seen in the Survivorship clinic due to patient's schedule.    11/12/2018 Genetic Testing   Negative genetic testing on the common hereditary cancer panel.  The Hereditary Gene Panel offered by [facility] includes sequencing and/or deletion duplication testing of the following 48 genes: APC, ATM, AXIN2, BARD1, BMPR1A, BRCA1, BRCA2, BRIP1, CDH1, CDK4, CDKN2A (p14ARF), CDKN2A (p16INK4a), CHEK2, CTNNA1, DICER1, EPCAM (Deletion/duplication testing only), GREM1 (promoter  region deletion/duplication testing only), KIT, MEN1, MLH1, MSH2, MSH3, MSH6, MUTYH, NBN, NF1, NHTL1, PALB2, PDGFRA, PMS2, POLD1, POLE, PTEN, RAD50, RAD51C, RAD51D, RNF43, SDHB, SDHC, SDHD, SMAD4, SMARCA4. STK11, TP53, TSC1,  TSC2, and VHL.  The following genes were evaluated for sequence changes only: SDHA and HOXB13 c.251G>A variant only. The report date is 11/12/2018.  AXIN2 c.815+fdup ([employer]) VUS identified.  This is still a normal result and will not change medical management.    11/05/2020 Mammogram   IMPRESSION: 1. Indeterminate calcifications at the right lumpectomy site spanning 1.0 cm.   2.  Stable postsurgical changes in the left breast.   01/10/2021 Surgery   RIGHT BREAST LUMPECTOMY to remove scar tissue and calcifications by Dr Ninfa Linden   FINAL MICROSCOPIC DIAGNOSIS:   A. BREAST, RIGHT, LUMPECTOMY:  - Benign skin and underlying breast parenchyma with previous  procedure-related changes, including dystrophic calcifications  - Mild fibrocystic change  - Negative for carcinoma    Malignant neoplasm of upper-inner quadrant of left breast in female, estrogen receptor positive ([place])  10/18/2018 Initial Diagnosis   Malignant neoplasm of upper-inner quadrant of left breast in female, estrogen receptor positive ([place])    10/18/2018 Initial Biopsy   Diagnosis Breast, left, needle core biopsy, 10 o'clock, 4 cm fn - INVASIVE MAMMARY CARCINOMA - MAMMARY CARCINOMA IN SITU - SEE COMMENT    10/18/2018 Receptors her2   ER: 100% with strong staining  PR: 80% with strong staining  HER2 Negative     10/18/2018 Imaging   Diagnostic Mammogram 10/18/18  IMPRESSION 2 hypoechoic masses in the left breast at 10 o'clock, 4 cm from the nipple. The larger mass measures 5 x 6 x 5 mm. The second mass measures 3 x 5 x 5 mm. The masses are 7 mm apart and span a total dimension 1.7 cm. No axillary adenopathy on the left.    10/18/2018 Cancer Staging   Staging form: Breast, AJCC 8th Edition - Clinical stage from 10/18/2018: Stage IA (cT1b, cN0, cM0, G2, ER+, PR+, HER2-) - Signed by [name], MD on 11/07/2018    10/30/2018 Breast MRI   Breast MRI 10/30/18  IMPRESSION: Biopsy-proven malignancy within the left breast  10 o'clock position. No additional suspicious areas of enhancement identified within either breast.    11/2018 -  Anti-estrogen oral therapy   Anastrozole 31m daily starting 11/2018 (before surgery). Held after 01/27/19 for further cancer treatment.    11/12/2018 Genetic Testing   Negative genetic testing on the common hereditary cancer panel.  The Hereditary Gene Panel offered by [facility] includes sequencing and/or deletion duplication testing of the following 48 genes: APC, ATM, AXIN2, BARD1, BMPR1A, BRCA1, BRCA2, BRIP1, CDH1, CDK4, CDKN2A (p14ARF), CDKN2A (p16INK4a), CHEK2, CTNNA1, DICER1, EPCAM (Deletion/duplication testing only), GREM1 (promoter region deletion/duplication testing only), KIT, MEN1, MLH1, MSH2, MSH3, MSH6, MUTYH, NBN, NF1, NHTL1, PALB2, PDGFRA, PMS2, POLD1, POLE, PTEN, RAD50, RAD51C, RAD51D, RNF43, SDHB, SDHC, SDHD, SMAD4, SMARCA4. STK11, TP53, TSC1, TSC2, and VHL.  The following genes were evaluated for sequence changes only: SDHA and HOXB13 c.251G>A variant only. The report date is 11/12/2018.  AXIN2 c.815+fdup ([employer]) VUS identified.  This is still a normal result and will not change medical management.    01/07/2019 Surgery   LEFT BREAST LUMPECTOMY WITH RADIOACTIVE SEED AND LEFT DEEP AXILLARY SENTINEL LYMPH NODE BIOPSY WITH BLUE DYE INJECTION by Dr IDalbert Batman6/2/20    01/07/2019 Pathology Results   Diagnosis 01/07/19 1. Breast, lumpectomy, Left w/seed - INVASIVE LOBULAR CARCINOMA, GRADE 2, 1.5 CM. SEE NOTE -  CARCINOMA IS 0.4 CM FROM THE SUPERIOR MARGIN AND 0.6 CM FROM THE INFERIOR MARGIN - NO EVIDENCE OF LYMPHOVASCULAR OR PERINEURAL INVASION - BIOPSY SITE CHANGES - SEE ONCOLOGY TABLE 2. Lymph node, sentinel, biopsy, Left axillary #1 - LYMPH NODE, NEGATIVE FOR CARCINOMA (0/1). SEE NOTE 3. Lymph node, sentinel, biopsy, Left axillary #2 - LYMPH NODE, NEGATIVE FOR CARCINOMA (0/1). SEE NOTE 4. Lymph node, sentinel, biopsy, Left - LYMPH NODE, NEGATIVE FOR CARCINOMA (0/1). SEE  NOTE 5. Lymph node, sentinel, biopsy, Left - LYMPH NODE, NEGATIVE FOR CARCINOMA (0/1). SEE NOTE 6. Lymph node, sentinel, biopsy, Left axillary #3 - LYMPH NODE, NEGATIVE FOR CARCINOMA (0/1). SEE NOTE   01/07/2019 Cancer Staging   Staging form: Breast, AJCC 8th Edition - Pathologic stage from 01/07/2019: Stage IA (pT1c, pN0, cM0, G2, ER+, PR+, HER2-, Oncotype DX score: 33) - Signed by [name], MD on 01/27/2019    01/21/2019 Oncotype testing   RS: 33, 21% distant recurrence risk at 9 years with tamoxifen alone; indicates >15% chemotherapy benefit    02/19/2019 - 05/14/2019 Adjuvant Chemotherapy   Adjuvant TC every 3 weeks for 4 cycles starting 02/19/19. She had allergic reation to Taxol with C2 during infusion. She was able to complete Cytoxan that cycle but not much Taxol. I switched taxol to Abraxane with C3. Completed chemo on 05/14/19.    06/17/2019 - 08/06/2019 Radiation Therapy   Adjuvant Radiation with Dr Sondra Come 06/17/19-08/06/19    08/2019 -  Anti-estrogen oral therapy   Started adjuvant Exemestane 08/2019    10/20/2019 Survivorship   SCP delivered by [name], NP       CURRENT THERAPY:  Anastrozole 62m daily starting 11/2018 (before surgery), held on 01/27/19 for further cancer treatment. Switched to Exemestane in Jan 2021  INTERVAL HISTORY:  PGOLDEN EMILEis here for a follow up of right and left breast cancer. She was last seen by me 1 year ago and seen by NP Lacie 6 months ago in interim. She presents to the clinic alone. She notes she uses mail service for her Exemestane and has not gotten her refill yet. She has been off for 1 week now and has contacted them about her delivery. She is overall tolerating Exemestane well. She notes she had breast scar tissue removed from right breast by Dr BNinfa Lindenon 01/10/21 given her mammograms were not reading well.   REVIEW OF SYSTEMS:   Constitutional: Denies fevers, chills or abnormal weight loss Eyes: Denies blurriness of vision Ears,  nose, mouth, throat, and face: Denies mucositis or sore throat Respiratory: Denies cough, dyspnea or wheezes Cardiovascular: Denies palpitation, chest discomfort or lower extremity swelling Gastrointestinal:  Denies nausea, heartburn or change in bowel habits Skin: Denies abnormal skin rashes Lymphatics: Denies new lymphadenopathy or easy bruising Neurological:Denies numbness, tingling or new weaknesses Behavioral/Psych: Mood is stable, no new changes  All other systems were reviewed with the patient and are negative.  MEDICAL HISTORY:  Past Medical History:  Diagnosis Date   Allergy    [place]   right breast   Breast cancer [facility] female [facility] 01/2019   [place] of [place] of [place] 09[place]15[place]2014   Family history of breast cancer    Family history of colon cancer    Family history of pancreatic cancer    Hot flashes    Hyperlipidemia    Hyperthyroidism    Personal history of chemotherapy 2020   left breast   Personal history of radiation therapy  2016   Personal history of radiation therapy 2020   Radiation 11/25/14-01/06/15   Right Breast    SURGICAL HISTORY: Past Surgical History:  Procedure Laterality Date   AUGMENTATION MAMMAPLASTY Bilateral 03/10/1997   BREAST BIOPSY Right 09/11/2014   BREAST BIOPSY Left 10/21/2018   BREAST LUMPECTOMY Right 10/20/2014   BREAST LUMPECTOMY Left 01/11/2019   BREAST LUMPECTOMY Right 01/10/2021   Procedure: RIGHT BREAST LUMPECTOMY;  Surgeon: Coralie Keens, MD;  Location: [place];  Service: General;  Laterality: Right;   BREAST LUMPECTOMY WITH RADIOACTIVE SEED AND SENTINEL LYMPH NODE BIOPSY N/A 01/07/2019   Procedure: LEFT BREAST LUMPECTOMY WITH RADIOACTIVE SEED AND LEFT DEEP AXILLARY SENTINEL LYMPH NODE BIOPSY WITH BLUE DYE INJECTION;  Surgeon: Fanny Skates, MD;  Location: [place];  Service: General;  Laterality: N/A;   COLONOSCOPY     DILATION AND CURETTAGE OF UTERUS      KNEE ARTHROSCOPY     PLACEMENT OF BREAST IMPLANTS  1995   bilat breast implants   TONSILLECTOMY     TUBAL LIGATION     WISDOM TOOTH EXTRACTION      I have reviewed the social history and family history with the patient and they are unchanged from previous note.  ALLERGIES:  is allergic to docetaxel.  MEDICATIONS:  Current Outpatient Medications  Medication Sig Dispense Refill   Ascorbic Acid (VITAMIN C) 1000 MG tablet Take 2,000 mg by mouth daily.      Calcium Carb-Cholecalciferol (CALCIUM 600 + D PO) Take 2 tablets by mouth daily.      cholecalciferol (VITAMIN D3) 25 MCG (1000 UT) tablet Take 1,000 Units by mouth daily.      exemestane (AROMASIN) 25 MG tablet Take 1 tablet (25 mg total) by mouth daily after breakfast. 90 tablet 3   Melatonin 10 MG CAPS Take 20 mg by mouth at bedtime as needed (sleep).      methimazole (TAPAZOLE) 5 MG tablet Take 5 mg by mouth daily.     Multiple Vitamin (MULTIVITAMIN WITH MINERALS) TABS tablet Take 1 tablet by mouth daily.      Naproxen Sod-diphenhydrAMINE 220-25 MG TABS Take 2 tablets by mouth at bedtime as needed (sleep).     naproxen sodium (ALEVE) 220 MG tablet Take 440 mg by mouth 2 (two) times daily as needed (pain).     sertraline (ZOLOFT) 100 MG tablet Take 200 mg by mouth daily.      simvastatin (ZOCOR) 40 MG tablet Take 40 mg by mouth every evening.     traMADol (ULTRAM) 50 MG tablet Take 1 tablet (50 mg total) by mouth every 6 (six) hours as needed. 25 tablet 0   vitamin E 100 UNIT capsule Take 200 Units by mouth daily.      No current facility-administered medications for this visit.    PHYSICAL EXAMINATION: ECOG PERFORMANCE STATUS: 0 - Asymptomatic  Vitals:   01/19/21 1125  BP: (!) 149/85  Pulse: 64  Resp: 17  Temp: 98.1 F (36.7 C)  SpO2: 98%   Filed Weights   01/19/21 1125  Weight: 153 lb 1.6 oz (69.4 kg)    GENERAL:alert, no distress and comfortable SKIN: skin color, texture, turgor are normal, no rashes or  significant lesions EYES: normal, Conjunctiva are pink and non-injected, sclera clear  NECK: supple, thyroid normal size, non-tender, without nodularity LYMPH:  no palpable lymphadenopathy in the cervical, axillary  LUNGS: clear to auscultation and percussion with normal breathing effort HEART: regular rate & rhythm and no murmurs and  no lower extremity edema ABDOMEN:abdomen soft, non-tender and normal bowel sounds Musculoskeletal:no cyanosis of digits and no clubbing  NEURO: alert & oriented x 3 with fluent speech, no focal motor/sensory deficits BREAST: S/p B/l lumpectomy: Surgical incision healed well. No palpable mass, nodules or adenopathy bilaterally. Breast exam benign.   LABORATORY DATA:  I have reviewed the data as listed CBC Latest Ref Rng & Units 01/19/2021 07/20/2020 07/22/2019  WBC 4.0 - 10.5 K/uL 8.0 6.2 5.8  Hemoglobin 12.0 - 15.0 g/dL 14.8 14.4 13.9  Hematocrit 36.0 - 46.0 % 44.8 43.6 42.8  Platelets 150 - 400 K/uL 223 228 227     CMP Latest Ref Rng & Units 01/19/2021 07/20/2020 07/22/2019  Glucose 70 - 99 mg/dL 102(H) 94 105(H)  BUN 8 - 23 mg/dL _0 Creatinine 0.44 - 1.00 mg/dL 0.73 0.85 0.76  Sodium 135 - 145 mmol/L 142 141 142  Potassium 3.5 - 5.1 mmol/L 4.6 4.5 4.3  Chloride 98 - 111 mmol/L 107 107 103  CO2 22 - 32 mmol/L _1 Calcium 8.9 - 10.3 mg/dL 9.7 9.2 9.7  Total Protein 6.5 - 8.1 g/dL 7.3 7.0 7.0  Total Bilirubin 0.3 - 1.2 mg/dL 0.5 0.6 0.3  Alkaline Phos 38 - 126 U/L 109 93 88  AST 15 - 41 U/L _2 ALT 0 - 44 U/L _3 RADIOGRAPHIC STUDIES: I have personally reviewed the radiological images as listed and agreed with the findings in the report. No results found.   ASSESSMENT & PLAN:  [name] is a 70 y.o. female with    1. Malignant neoplasm of upper-inner quadrant of left breast, invasive lobular carcinoma, Stage IA, pT1c,N0,M0, ER+/PR+, HER2-, Grade II, RS 33 -She was diagnosed in 10/2018. She started  neoadjuvant anastrozole in 11/2018 due to the delay of her surgery due to [place] pandemic. This was held after her breast surgery.  -She underwent left breast lumpectomy on 01/07/19. Given her RS 33 and high risk disease, she completed 4 cycles of adjuvant chemo TC and adjuvant Radiation. -She restarted antiestrogen therapy with Exemestane in 08/2019. Tolerating well  -She is clinically doing well. Lab reviewed, her CBC and CMP are within normal limits. Her physical exam was unremarkable. There is no clinical concern for recurrence. -Given her 11/05/20 mammogram showed indeterminate calcifications of right breast surgical site, she underwent right lumpectomy on 01/10/21. Path was negative. Will continue surveillance. Next mammogram in 11/2021.  -Continue Exemestane. She is tolerating well  -F/u in 6 months.     2. 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 She is s/p right lumpectomy and SLNB and adjuvant radiation. -Given her negative ER/PR status, there is no benefit of antiestrogen therapy. She is currently on surveillance. -Her 11/05/20 mammogram showed indeterminate calcifications at right lumpectomy site. She underwent another right lumpectomy to remove calcification and scar tissue by Dr Ninfa Linden on 01/10/21. Path was negative.  -Will continue surveillance.    3. Genetic BRCA plus panel was negative for pathogenetic mutations   4. Bone health -Her last DEXA scan was 03/2019, which showed osteopenia in hips. Will continue to monitor every 2 years.  -I She will continue Calcium and Vit D. I also encouraged her to exercise.     PLAN: -Continue Exemestane  -DEXA scan at PCP office in late 2022  -lab and F/u in 6 months    No problem-specific Assessment &  Plan notes found for this encounter.   No orders of the defined types were placed in this encounter.  All questions were answered. The patient knows to call the clinic with any problems, questions or  concerns. No barriers to learning was detected. The total time spent in the appointment was 30 minutes.     [name], MD 01/19/2021   I, Joslyn Devon, am acting as scribe for [name], MD.   I have reviewed the above documentation for accuracy and completeness, and I agree with the above.

## 2021-01-19 ENCOUNTER — Inpatient Hospital Stay (HOSPITAL_BASED_OUTPATIENT_CLINIC_OR_DEPARTMENT_OTHER): Payer: Medicare HMO | Admitting: Hematology

## 2021-01-19 ENCOUNTER — Telehealth: Payer: Self-pay | Admitting: Hematology

## 2021-01-19 ENCOUNTER — Encounter: Payer: Self-pay | Admitting: Hematology

## 2021-01-19 ENCOUNTER — Other Ambulatory Visit: Payer: Self-pay

## 2021-01-19 ENCOUNTER — Inpatient Hospital Stay: Payer: Medicare HMO | Attending: Hematology

## 2021-01-19 VITALS — BP 149/85 | HR 64 | Temp 98.1°F | Resp 17 | Ht 60.0 in | Wt 153.1 lb

## 2021-01-19 DIAGNOSIS — C50211 Malignant neoplasm of upper-inner quadrant of right female breast: Secondary | ICD-10-CM

## 2021-01-19 DIAGNOSIS — C50212 Malignant neoplasm of upper-inner quadrant of left female breast: Secondary | ICD-10-CM

## 2021-01-19 DIAGNOSIS — Z171 Estrogen receptor negative status [ER-]: Secondary | ICD-10-CM

## 2021-01-19 DIAGNOSIS — Z79811 Long term (current) use of aromatase inhibitors: Secondary | ICD-10-CM | POA: Diagnosis not present

## 2021-01-19 DIAGNOSIS — M8580 Other specified disorders of bone density and structure, unspecified site: Secondary | ICD-10-CM | POA: Insufficient documentation

## 2021-01-19 DIAGNOSIS — Z170 Estrogen receptor positive status [ER+]: Secondary | ICD-10-CM | POA: Diagnosis not present

## 2021-01-19 LAB — CBC WITH DIFFERENTIAL (CANCER CENTER ONLY)
Abs Immature Granulocytes: 0.02 10*3/uL (ref 0.00–0.07)
Basophils Absolute: 0 10*3/uL (ref 0.0–0.1)
Basophils Relative: 1 %
Eosinophils Absolute: 0 10*3/uL (ref 0.0–0.5)
Eosinophils Relative: 1 %
HCT: 44.8 % (ref 36.0–46.0)
Hemoglobin: 14.8 g/dL (ref 12.0–15.0)
Immature Granulocytes: 0 %
Lymphocytes Relative: 26 %
Lymphs Abs: 2.1 10*3/uL (ref 0.7–4.0)
MCH: 29.9 pg (ref 26.0–34.0)
MCHC: 33 g/dL (ref 30.0–36.0)
MCV: 90.5 fL (ref 80.0–100.0)
Monocytes Absolute: 0.6 10*3/uL (ref 0.1–1.0)
Monocytes Relative: 7 %
Neutro Abs: 5.3 10*3/uL (ref 1.7–7.7)
Neutrophils Relative %: 65 %
Platelet Count: 223 10*3/uL (ref 150–400)
RBC: 4.95 MIL/uL (ref 3.87–5.11)
RDW: 12.3 % (ref 11.5–15.5)
WBC Count: 8 10*3/uL (ref 4.0–10.5)
nRBC: 0 % (ref 0.0–0.2)

## 2021-01-19 LAB — CMP (CANCER CENTER ONLY)
ALT: 17 U/L (ref 0–44)
AST: 16 U/L (ref 15–41)
Albumin: 4.1 g/dL (ref 3.5–5.0)
Alkaline Phosphatase: 109 U/L (ref 38–126)
Anion gap: 9 (ref 5–15)
BUN: 18 mg/dL (ref 8–23)
CO2: 26 mmol/L (ref 22–32)
Calcium: 9.7 mg/dL (ref 8.9–10.3)
Chloride: 107 mmol/L (ref 98–111)
Creatinine: 0.73 mg/dL (ref 0.44–1.00)
GFR, Estimated: >60 mL/min (ref >60)
Glucose, Bld: 102 mg/dL — ABNORMAL HIGH (ref 70–99)
Potassium: 4.6 mmol/L (ref 3.5–5.1)
Sodium: 142 mmol/L (ref 135–145)
Total Bilirubin: 0.5 mg/dL (ref 0.3–1.2)
Total Protein: 7.3 g/dL (ref 6.5–8.1)

## 2021-01-19 NOTE — Telephone Encounter (Signed)
Scheduled per los. Gave avs and calendar  

## 2021-02-15 DIAGNOSIS — E0590 Thyrotoxicosis, unspecified without thyrotoxic crisis or storm: Secondary | ICD-10-CM | POA: Diagnosis not present

## 2021-02-21 DIAGNOSIS — R7309 Other abnormal glucose: Secondary | ICD-10-CM | POA: Diagnosis not present

## 2021-02-21 DIAGNOSIS — E0500 Thyrotoxicosis with diffuse goiter without thyrotoxic crisis or storm: Secondary | ICD-10-CM | POA: Diagnosis not present

## 2021-02-21 DIAGNOSIS — E785 Hyperlipidemia, unspecified: Secondary | ICD-10-CM | POA: Diagnosis not present

## 2021-02-21 DIAGNOSIS — E041 Nontoxic single thyroid nodule: Secondary | ICD-10-CM | POA: Diagnosis not present

## 2021-02-21 DIAGNOSIS — Z6828 Body mass index (BMI) 28.0-28.9, adult: Secondary | ICD-10-CM | POA: Diagnosis not present

## 2021-02-21 DIAGNOSIS — C50912 Malignant neoplasm of unspecified site of left female breast: Secondary | ICD-10-CM | POA: Diagnosis not present

## 2021-02-21 DIAGNOSIS — E0590 Thyrotoxicosis, unspecified without thyrotoxic crisis or storm: Secondary | ICD-10-CM | POA: Diagnosis not present

## 2021-02-21 DIAGNOSIS — M810 Age-related osteoporosis without current pathological fracture: Secondary | ICD-10-CM | POA: Diagnosis not present

## 2021-03-22 ENCOUNTER — Encounter: Payer: Self-pay | Admitting: Genetic Counselor

## 2021-04-14 DIAGNOSIS — E785 Hyperlipidemia, unspecified: Secondary | ICD-10-CM | POA: Diagnosis not present

## 2021-04-14 DIAGNOSIS — Z0000 Encounter for general adult medical examination without abnormal findings: Secondary | ICD-10-CM | POA: Diagnosis not present

## 2021-04-14 DIAGNOSIS — E0590 Thyrotoxicosis, unspecified without thyrotoxic crisis or storm: Secondary | ICD-10-CM | POA: Diagnosis not present

## 2021-04-21 DIAGNOSIS — Z853 Personal history of malignant neoplasm of breast: Secondary | ICD-10-CM | POA: Diagnosis not present

## 2021-04-21 DIAGNOSIS — M25552 Pain in left hip: Secondary | ICD-10-CM | POA: Diagnosis not present

## 2021-04-21 DIAGNOSIS — M25551 Pain in right hip: Secondary | ICD-10-CM | POA: Diagnosis not present

## 2021-04-21 DIAGNOSIS — F329 Major depressive disorder, single episode, unspecified: Secondary | ICD-10-CM | POA: Diagnosis not present

## 2021-04-21 DIAGNOSIS — R0981 Nasal congestion: Secondary | ICD-10-CM | POA: Diagnosis not present

## 2021-04-21 DIAGNOSIS — Z0000 Encounter for general adult medical examination without abnormal findings: Secondary | ICD-10-CM | POA: Diagnosis not present

## 2021-04-21 DIAGNOSIS — E0500 Thyrotoxicosis with diffuse goiter without thyrotoxic crisis or storm: Secondary | ICD-10-CM | POA: Diagnosis not present

## 2021-04-21 DIAGNOSIS — M810 Age-related osteoporosis without current pathological fracture: Secondary | ICD-10-CM | POA: Diagnosis not present

## 2021-04-21 DIAGNOSIS — E785 Hyperlipidemia, unspecified: Secondary | ICD-10-CM | POA: Diagnosis not present

## 2021-05-05 DIAGNOSIS — M810 Age-related osteoporosis without current pathological fracture: Secondary | ICD-10-CM | POA: Diagnosis not present

## 2021-05-05 DIAGNOSIS — E0590 Thyrotoxicosis, unspecified without thyrotoxic crisis or storm: Secondary | ICD-10-CM | POA: Diagnosis not present

## 2021-05-05 DIAGNOSIS — E0500 Thyrotoxicosis with diffuse goiter without thyrotoxic crisis or storm: Secondary | ICD-10-CM | POA: Diagnosis not present

## 2021-05-05 DIAGNOSIS — E785 Hyperlipidemia, unspecified: Secondary | ICD-10-CM | POA: Diagnosis not present

## 2021-05-05 DIAGNOSIS — M8589 Other specified disorders of bone density and structure, multiple sites: Secondary | ICD-10-CM | POA: Diagnosis not present

## 2021-05-05 DIAGNOSIS — Z6828 Body mass index (BMI) 28.0-28.9, adult: Secondary | ICD-10-CM | POA: Diagnosis not present

## 2021-05-05 DIAGNOSIS — E041 Nontoxic single thyroid nodule: Secondary | ICD-10-CM | POA: Diagnosis not present

## 2021-05-05 DIAGNOSIS — C50912 Malignant neoplasm of unspecified site of left female breast: Secondary | ICD-10-CM | POA: Diagnosis not present

## 2021-05-05 DIAGNOSIS — R7309 Other abnormal glucose: Secondary | ICD-10-CM | POA: Diagnosis not present

## 2021-07-21 ENCOUNTER — Other Ambulatory Visit: Payer: Self-pay

## 2021-07-21 ENCOUNTER — Inpatient Hospital Stay: Payer: Medicare HMO

## 2021-07-21 ENCOUNTER — Inpatient Hospital Stay: Payer: Medicare HMO | Attending: Hematology | Admitting: Hematology

## 2021-07-21 ENCOUNTER — Encounter: Payer: Self-pay | Admitting: Hematology

## 2021-07-21 VITALS — BP 152/77 | HR 62 | Temp 98.2°F | Resp 16 | Ht 60.0 in | Wt 149.8 lb

## 2021-07-21 DIAGNOSIS — C50211 Malignant neoplasm of upper-inner quadrant of right female breast: Secondary | ICD-10-CM

## 2021-07-21 DIAGNOSIS — Z171 Estrogen receptor negative status [ER-]: Secondary | ICD-10-CM

## 2021-07-21 DIAGNOSIS — C50212 Malignant neoplasm of upper-inner quadrant of left female breast: Secondary | ICD-10-CM

## 2021-07-21 DIAGNOSIS — Z170 Estrogen receptor positive status [ER+]: Secondary | ICD-10-CM | POA: Diagnosis not present

## 2021-07-21 LAB — CBC WITH DIFFERENTIAL (CANCER CENTER ONLY)
Abs Immature Granulocytes: 0.02 10*3/uL (ref 0.00–0.07)
Basophils Absolute: 0 10*3/uL (ref 0.0–0.1)
Basophils Relative: 1 %
Eosinophils Absolute: 0 10*3/uL (ref 0.0–0.5)
Eosinophils Relative: 1 %
HCT: 42.3 % (ref 36.0–46.0)
Hemoglobin: 14.2 g/dL (ref 12.0–15.0)
Immature Granulocytes: 0 %
Lymphocytes Relative: 33 %
Lymphs Abs: 2.2 10*3/uL (ref 0.7–4.0)
MCH: 30 pg (ref 26.0–34.0)
MCHC: 33.6 g/dL (ref 30.0–36.0)
MCV: 89.4 fL (ref 80.0–100.0)
Monocytes Absolute: 0.4 10*3/uL (ref 0.1–1.0)
Monocytes Relative: 7 %
Neutro Abs: 3.9 10*3/uL (ref 1.7–7.7)
Neutrophils Relative %: 58 %
Platelet Count: 231 10*3/uL (ref 150–400)
RBC: 4.73 MIL/uL (ref 3.87–5.11)
RDW: 12.1 % (ref 11.5–15.5)
WBC Count: 6.7 10*3/uL (ref 4.0–10.5)
nRBC: 0 % (ref 0.0–0.2)

## 2021-07-21 LAB — CMP (CANCER CENTER ONLY)
ALT: 18 U/L (ref 0–44)
AST: 17 U/L (ref 15–41)
Albumin: 4.1 g/dL (ref 3.5–5.0)
Alkaline Phosphatase: 88 U/L (ref 38–126)
Anion gap: 8 (ref 5–15)
BUN: 18 mg/dL (ref 8–23)
CO2: 27 mmol/L (ref 22–32)
Calcium: 9 mg/dL (ref 8.9–10.3)
Chloride: 108 mmol/L (ref 98–111)
Creatinine: 0.77 mg/dL (ref 0.44–1.00)
GFR, Estimated: >60 mL/min (ref >60)
Glucose, Bld: 108 mg/dL — ABNORMAL HIGH (ref 70–99)
Potassium: 4.3 mmol/L (ref 3.5–5.1)
Sodium: 143 mmol/L (ref 135–145)
Total Bilirubin: 0.5 mg/dL (ref 0.3–1.2)
Total Protein: 6.9 g/dL (ref 6.5–8.1)

## 2021-07-21 MED ORDER — ANASTROZOLE 1 MG PO TABS: 1.0000 mg | ORAL_TABLET | Freq: Every day | ORAL | 5 refills | Status: DC

## 2021-07-21 NOTE — Progress Notes (Signed)
[place]   [phone] [phone]   Clinic Follow up Note   Patient Care Team: [name], DO as PCP - General (Family Medicine) Erroll Luna, MD as Consulting Physician (General Surgery) [name], MD as Consulting Physician (Hematology) Thea Silversmith, MD as Consulting Physician (Radiation Oncology) Rockwell Germany, RN as Registered Nurse Mauro Kaufmann, RN as Registered Nurse Holley Bouche, NP (Inactive) as Nurse Practitioner (Nurse Practitioner) Sylvan Cheese, NP as Nurse Practitioner (Nurse Practitioner) Madelin Rear, MD as Consulting Physician (Endocrinology) Alla Feeling, NP as Nurse Practitioner (Nurse Practitioner)  Date of Service:  07/21/2021  CHIEF COMPLAINT: f/u of bilateral breast cancer  CURRENT THERAPY:  Anastrozole 15m daily starting 11/2018 (before surgery), held on 01/27/19 for further cancer treatment. Switched to Exemestane in Jan 2021  ASSESSMENT & PLAN:  PWANONA STAREis a 70y.o. female with   1. Malignant neoplasm of upper-inner quadrant of left breast, invasive lobular carcinoma, Stage IA, pT1c,N0,M0, ER+/PR+, HER2-, Grade II, RS 33 -She was diagnosed in 10/2018. She started neoadjuvant anastrozole in 11/2018 due to the delay of her surgery due to [place]. This was held after her breast surgery.  -She underwent left breast lumpectomy on 01/07/19. Given her RS 33 and high risk disease, she completed 4 cycles of adjuvant chemo TC and adjuvant Radiation. -She restarted antiestrogen therapy with Exemestane in 08/2019. Tolerating well  -She is clinically doing well. Lab reviewed, her CBC and CMP are within normal limits. Her physical exam was unremarkable. There is no clinical concern for recurrence. -continue surveillance. Next mammogram in 11/2021.  -due to the high cost of Exemestane, she is not able to afford it anymore, will change it to anastrozole.   -F/u in 6 months.    2. 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 She is s/p right lumpectomy and SLNB and adjuvant radiation. -Given her negative ER/PR status, there is no benefit of antiestrogen therapy. She is currently on surveillance. -Her 11/05/20 mammogram showed indeterminate calcifications at right lumpectomy site. She underwent another right lumpectomy to remove calcification and scar tissue by Dr BNinfa Lindenon 01/10/21. Path was negative.  -Will continue surveillance.    3. Genetic BRCA plus panel was negative for pathogenetic mutations   4. Osteopenia  -Her last DEXA scan was 03/2019, which showed osteopenia in hips (T-score -2.2). Repeat in 03/2021 showed some worsening osteopenia with T-score of -2.4 with risk of hip fracture 3.3% in next 10 years  -I recommend Zometa with her today. I reviewed the indications and side effects with her. She agrees to proceed. She will have her dental  cavity fixed in next few weeks      PLAN: -due to cost issue, I will change Exemestane to anastrozole, I called in today  -Zometa in 1 and 7 months, OK to use today's lad for first zometa infusion  -mammogram due 11/2021 -lab and F/u in 7 months    No problem-specific Assessment & Plan notes found for this encounter.   SUMMARY OF ONCOLOGIC HISTORY: Oncology History Overview Note  Cancer Staging Breast cancer of upper-inner quadrant of right female breast ([facility] Staging form: Breast, AJCC 7th Edition - Clinical stage from 09/23/2014: Stage 0 (Tis (DCIS), N0, M0) - Unsigned - Pathologic stage from 10/22/2014: Stage 0 (Tis (DCIS), N0, cM0) - Signed by KEnid Cutter MD on 11/03/2014 Staging comments: Staged on final lumpectomy specimen by Dr. RDonato Heinz Malignant  of upper-inner quadrant of left breast in female, estrogen receptor positive (HCC) °Staging form: Breast, AJCC 8th Edition °- Clinical: cT1b, cN0, cM0, G2, ER+, PR+ - Unsigned ° °  °Breast cancer of upper-inner quadrant of right  female breast (HCC)  °09/11/2014 Mammogram  ° Right breast: area of pleomorphic calcifications and associated architectural distortion, with irregular density at the 1 o'clock position of the right breast. No other suspicious findings in the right breast.  °  °09/11/2014 Breast US  ° Right breast: area of hypoechogenecity 2 cm x 0.9 cm x 1.6 cm in size, 12 o'clock position, 3 cm the nipple. No discrete suspicious mass. °  °09/11/2014 Initial Biopsy  ° Right breast needle core bx (11-12 o'clock, UOQ): DCIS, grade 3, with necrosis and calcifications, ER- (0%), PR- (0%) °  °09/11/2014 Pathologic Stage  ° Stage 0: Tis Nx  °  °09/17/2014 Breast MRI  ° Non mass enhancement in the upper and slight outer right breast measuring approximately 2.6 x 1.7 x 2.2 cm. Biopsy proven fibrocystic changes in the upper outer periareolar right breast manifested as a 9 x 7 mm mass. °  °09/24/2014 Procedure  ° Genetic testing: BRCA plus and BreastNext panels ([place]) performed showing no clinically significant variants detected including ATM, BARD1, BRCA1, BRCA2, BRIP1, CDH1, CHEK2, MRE11A, MUTYH, NBN, NF1, PALB2, PTEN, RAD50, RAD51C, RAD51D, and TP53. °  °10/20/2014 Definitive Surgery  ° Right lumpectomy with SLNB ([place]): DCIS with necrosis and calcifications, grade 3, 1 mm from nearest margin (posterior), 0/2 LN with evidence of malignancy. °  °10/20/2014 Clinical Stage  ° Stage 0: Tis N0 °  °11/25/2014 - 01/06/2015 Radiation Therapy  ° Adjuvant RT completed ([place]): Right breast 50 Gy over 25 fractions; right breast boost 10 Gy over 5 fractions. Total dose received: 60 Gy.  °  °04/13/2015 Survivorship  ° Survivorship care plan completed and copy mailed to patient as she was unable to be seen in the Survivorship clinic due to patient's schedule. °  °11/12/2018 Genetic Testing  ° Negative genetic testing on the common hereditary cancer panel.  The Hereditary Gene Panel offered by [facility] includes sequencing and/or deletion duplication testing of the  following 48 genes: APC, ATM, AXIN2, BARD1, BMPR1A, BRCA1, BRCA2, BRIP1, CDH1, CDK4, CDKN2A (p14ARF), CDKN2A (p16INK4a), CHEK2, CTNNA1, DICER1, EPCAM (Deletion/duplication testing only), GREM1 (promoter region deletion/duplication testing only), KIT, MEN1, MLH1, MSH2, MSH3, MSH6, MUTYH, NBN, NF1, NHTL1, PALB2, PDGFRA, PMS2, POLD1, POLE, PTEN, RAD50, RAD51C, RAD51D, RNF43, SDHB, SDHC, SDHD, SMAD4, SMARCA4. STK11, TP53, TSC1, TSC2, and VHL.  The following genes were evaluated for sequence changes only: SDHA and HOXB13 c.251G>A variant only. The report date is 11/12/2018. ° °AXIN2 c.815+fdup ([employer]) VUS identified.  This is still a normal result and will not change medical management. °  °11/05/2020 Mammogram  ° IMPRESSION: °1. Indeterminate calcifications at the right lumpectomy site °spanning 1.0 cm. °  °2.  Stable postsurgical changes in the left breast. °  °01/10/2021 Surgery  ° RIGHT BREAST LUMPECTOMY to remove scar tissue and calcifications by Dr Blackman  ° °FINAL MICROSCOPIC DIAGNOSIS:  ° °A. BREAST, RIGHT, LUMPECTOMY:  °- Benign skin and underlying breast parenchyma with previous  °procedure-related changes, including dystrophic calcifications  °- Mild fibrocystic change  °- Negative for carcinoma  °  °Malignant neoplasm of upper-inner quadrant of left breast in female, estrogen receptor positive (HCC)  °10/18/2018 Initial Diagnosis  ° Malignant neoplasm of upper-inner quadrant of left breast in female, estrogen receptor positive (HCC) °  °10/18/2018 Initial Biopsy  °   Diagnosis °Breast, left, needle core biopsy, 10 o'clock, 4 cm fn °- INVASIVE MAMMARY CARCINOMA °- MAMMARY CARCINOMA IN SITU °- SEE COMMENT °  °10/18/2018 Receptors her2  ° ER: 100% with strong staining  °PR: 80% with strong staining  °HER2 Negative  °  °10/18/2018 Imaging  ° Diagnostic Mammogram 10/18/18  °IMPRESSION °2 hypoechoic masses in the °left breast at 10 o'clock, 4 cm from the nipple. The larger mass °measures 5 x 6 x 5 mm. The second mass  measures 3 x 5 x 5 mm. The °masses are 7 mm apart and span a total dimension 1.7 cm. No axillary °adenopathy on the left. °  °10/18/2018 Cancer Staging  ° Staging form: Breast, AJCC 8th Edition °- Clinical stage from 10/18/2018: Stage IA (cT1b, cN0, cM0, G2, ER+, PR+, HER2-) - Signed by Feng, Yan, MD on 11/07/2018 ° °  °10/30/2018 Breast MRI  ° Breast MRI 10/30/18  °IMPRESSION: °Biopsy-proven malignancy within the left breast 10 o'clock position. °No additional suspicious areas of enhancement identified within °either breast. °  °11/2018 -  Anti-estrogen oral therapy  ° Anastrozole 1mg daily starting 11/2018 (before surgery). Held after 01/27/19 for further cancer treatment.  °  °11/12/2018 Genetic Testing  ° Negative genetic testing on the common hereditary cancer panel.  The Hereditary Gene Panel offered by [facility] includes sequencing and/or deletion duplication testing of the following 48 genes: APC, ATM, AXIN2, BARD1, BMPR1A, BRCA1, BRCA2, BRIP1, CDH1, CDK4, CDKN2A (p14ARF), CDKN2A (p16INK4a), CHEK2, CTNNA1, DICER1, EPCAM (Deletion/duplication testing only), GREM1 (promoter region deletion/duplication testing only), KIT, MEN1, MLH1, MSH2, MSH3, MSH6, MUTYH, NBN, NF1, NHTL1, PALB2, PDGFRA, PMS2, POLD1, POLE, PTEN, RAD50, RAD51C, RAD51D, RNF43, SDHB, SDHC, SDHD, SMAD4, SMARCA4. STK11, TP53, TSC1, TSC2, and VHL.  The following genes were evaluated for sequence changes only: SDHA and HOXB13 c.251G>A variant only. The report date is 11/12/2018. ° °AXIN2 c.815+fdup ([employer]) VUS identified.  This is still a normal result and will not change medical management. °  °01/07/2019 Surgery  ° LEFT BREAST LUMPECTOMY WITH RADIOACTIVE SEED AND LEFT DEEP AXILLARY SENTINEL LYMPH NODE BIOPSY WITH BLUE DYE INJECTION °by Dr Ingram 01/07/19  °  °01/07/2019 Pathology Results  ° Diagnosis 01/07/19 °1. Breast, lumpectomy, Left w/seed °- INVASIVE LOBULAR CARCINOMA, GRADE 2, 1.5 CM. SEE NOTE °- CARCINOMA IS 0.4 CM FROM THE SUPERIOR MARGIN AND 0.6 CM FROM THE  INFERIOR MARGIN °- NO EVIDENCE OF LYMPHOVASCULAR OR PERINEURAL INVASION °- BIOPSY SITE CHANGES °- SEE ONCOLOGY TABLE °2. Lymph node, sentinel, biopsy, Left axillary #1 °- LYMPH NODE, NEGATIVE FOR CARCINOMA (0/1). SEE NOTE °3. Lymph node, sentinel, biopsy, Left axillary #2 °- LYMPH NODE, NEGATIVE FOR CARCINOMA (0/1). SEE NOTE °4. Lymph node, sentinel, biopsy, Left °- LYMPH NODE, NEGATIVE FOR CARCINOMA (0/1). SEE NOTE °5. Lymph node, sentinel, biopsy, Left °- LYMPH NODE, NEGATIVE FOR CARCINOMA (0/1). SEE NOTE °6. Lymph node, sentinel, biopsy, Left axillary #3 °- LYMPH NODE, NEGATIVE FOR CARCINOMA (0/1). SEE NOTE °  °01/07/2019 Cancer Staging  ° Staging form: Breast, AJCC 8th Edition °- Pathologic stage from 01/07/2019: Stage IA (pT1c, pN0, cM0, G2, ER+, PR+, HER2-, Oncotype DX score: 33) - Signed by Feng, Yan, MD on 01/27/2019 ° °  °01/21/2019 Oncotype testing  ° RS: 33, 21% distant recurrence risk at 9 years with tamoxifen alone; indicates >15% chemotherapy benefit  °  °02/19/2019 - 05/14/2019 Adjuvant Chemotherapy  ° Adjuvant TC every 3 weeks for 4 cycles starting 02/19/19. She had allergic reation to Taxol with C2 during infusion. She was able to   complete Cytoxan that cycle but not much Taxol. I switched taxol to Abraxane with C3. Completed chemo on 05/14/19.  °  °06/17/2019 - 08/06/2019 Radiation Therapy  ° Adjuvant Radiation with Dr Kinard 06/17/19-08/06/19  °  °08/2019 -  Anti-estrogen oral therapy  ° Started adjuvant Exemestane 08/2019  °  °10/20/2019 Survivorship  ° SCP delivered by Lacie Burton, NP  °  ° ° ° °INTERVAL HISTORY:  °[name] is here for a follow up of breast cancer. She was last seen by me on 01/19/21. She presents to the clinic alone. °She reports she is doing well overall with no new complaints. °  °All other systems were reviewed with the patient and are negative. ° °MEDICAL HISTORY:  °Past Medical History:  °Diagnosis Date  ° Allergy   ° Breast cancer (HCC) 09/11/14  ° right breast  ° Breast cancer in  female (HCC) 01/2019  ° Breast cancer of upper-inner quadrant of right female breast (HCC) 09/15/2014  ° Family history of breast cancer   ° Family history of colon cancer   ° Family history of pancreatic cancer   ° Hot flashes   ° Hyperlipidemia   ° Hyperthyroidism   ° Personal history of chemotherapy 2020  ° left breast  ° Personal history of radiation therapy 2016  ° Personal history of radiation therapy 2020  ° Radiation 11/25/14-01/06/15  ° Right Breast  ° ° °SURGICAL HISTORY: °Past Surgical History:  °Procedure Laterality Date  ° AUGMENTATION MAMMAPLASTY Bilateral 03/10/1997  ° BREAST BIOPSY Right 09/11/2014  ° BREAST BIOPSY Left 10/21/2018  ° BREAST LUMPECTOMY Right 10/20/2014  ° BREAST LUMPECTOMY Left 01/11/2019  ° BREAST LUMPECTOMY Right 01/10/2021  ° Procedure: RIGHT BREAST LUMPECTOMY;  Surgeon: Blackman, Douglas, MD;  Location: [facility];  Service: General;  Laterality: Right;  ° BREAST LUMPECTOMY WITH RADIOACTIVE SEED AND SENTINEL LYMPH NODE BIOPSY N/A 01/07/2019  ° Procedure: LEFT BREAST LUMPECTOMY WITH RADIOACTIVE SEED AND LEFT DEEP AXILLARY SENTINEL LYMPH NODE BIOPSY WITH BLUE DYE INJECTION;  Surgeon: Ingram, Haywood, MD;  Location: [facility];  Service: General;  Laterality: N/A;  ° COLONOSCOPY    ° DILATION AND CURETTAGE OF UTERUS    ° KNEE ARTHROSCOPY    ° PLACEMENT OF BREAST IMPLANTS  1995  ° bilat breast implants  ° TONSILLECTOMY    ° TUBAL LIGATION    ° WISDOM TOOTH EXTRACTION    ° ° °I have reviewed the social history and family history with the patient and they are unchanged from previous note. ° °ALLERGIES:  is allergic to docetaxel. ° °MEDICATIONS:  °Current Outpatient Medications  °Medication Sig Dispense Refill  ° anastrozole (ARIMIDEX) 1 MG tablet Take 1 tablet (1 mg total) by mouth daily. 30 tablet 5  ° Ascorbic Acid (VITAMIN C) 1000 MG tablet Take 2,000 mg by mouth daily.     ° Calcium Carb-Cholecalciferol (CALCIUM 600 + D PO) Take 2 tablets by mouth daily.     ° cholecalciferol  (VITAMIN D3) 25 MCG (1000 UT) tablet Take 1,000 Units by mouth daily.     ° Melatonin 10 MG CAPS Take 20 mg by mouth at bedtime as needed (sleep).     ° methimazole (TAPAZOLE) 5 MG tablet Take 5 mg by mouth daily.    ° Multiple Vitamin (MULTIVITAMIN WITH MINERALS) TABS tablet Take 1 tablet by mouth daily.     ° Naproxen Sod-diphenhydrAMINE 220-25 MG TABS Take 2 tablets by mouth at bedtime as needed (sleep).    °   sertraline (ZOLOFT) 100 MG tablet Take 200 mg by mouth daily.     ° simvastatin (ZOCOR) 40 MG tablet Take 40 mg by mouth every evening.    ° vitamin E 100 UNIT capsule Take 200 Units by mouth daily.     ° °No current facility-administered medications for this visit.  ° ° °PHYSICAL EXAMINATION: °ECOG PERFORMANCE STATUS: 0 - Asymptomatic ° °Vitals:  ° 07/21/21 1102  °BP: (!) 152/77  °Pulse: 62  °Resp: 16  °Temp: 98.2 °F (36.8 °C)  °SpO2: 95%  ° °Wt Readings from Last 3 Encounters:  °07/21/21 149 lb 12.8 oz (67.9 kg)  °01/19/21 153 lb 1.6 oz (69.4 kg)  °01/10/21 153 lb (69.4 kg)  °  ° °GENERAL:alert, no distress and comfortable °SKIN: skin color, texture, turgor are normal, no rashes or significant lesions °EYES: normal, Conjunctiva are pink and non-injected, sclera clear  °NECK: supple, thyroid normal size, non-tender, without nodularity °LYMPH:  no palpable lymphadenopathy in the cervical, axillary  °LUNGS: clear to auscultation and percussion with normal breathing effort °HEART: regular rate & rhythm and no murmurs and no lower extremity edema °ABDOMEN:abdomen soft, non-tender and normal bowel sounds °Musculoskeletal:no cyanosis of digits and no clubbing  °NEURO: alert & oriented x 3 with fluent speech, no focal motor/sensory deficits °BREAST: No palpable mass, nodules or adenopathy bilaterally. Breast exam benign.  ° °LABORATORY DATA:  °I have reviewed the data as listed °CBC Latest Ref Rng & Units 07/21/2021 01/19/2021 07/20/2020  °WBC 4.0 - 10.5 K/uL 6.7 8.0 6.2  °Hemoglobin 12.0 - 15.0 g/dL 14.2 14.8 14.4   °Hematocrit 36.0 - 46.0 % 42.3 44.8 43.6  °Platelets 150 - 400 K/uL 231 223 228  ° ° ° °CMP Latest Ref Rng & Units 07/21/2021 01/19/2021 07/20/2020  °Glucose 70 - 99 mg/dL 108(H) 102(H) 94  °BUN 8 - 23 mg/dL 18 18 18  °Creatinine 0.44 - 1.00 mg/dL 0.77 0.73 0.85  °Sodium 135 - 145 mmol/L 143 142 141  °Potassium 3.5 - 5.1 mmol/L 4.3 4.6 4.5  °Chloride 98 - 111 mmol/L 108 107 107  °CO2 22 - 32 mmol/L 27 26 28  °Calcium 8.9 - 10.3 mg/dL 9.0 9.7 9.2  °Total Protein 6.5 - 8.1 g/dL 6.9 7.3 7.0  °Total Bilirubin 0.3 - 1.2 mg/dL 0.5 0.5 0.6  °Alkaline Phos 38 - 126 U/L 88 109 93  °AST 15 - 41 U/L 17 16 17  °ALT 0 - 44 U/L 18 17 22  ° ° ° ° °RADIOGRAPHIC STUDIES: °I have personally reviewed the radiological images as listed and agreed with the findings in the report. °No results found.  ° ° °Orders Placed This Encounter  °Procedures  ° MM DIAG BREAST TOMO BILATERAL  °  Standing Status:   Future  °  Standing Expiration Date:   07/21/2022  °  Order Specific Question:   Reason for Exam (SYMPTOM  OR DIAGNOSIS REQUIRED)  °  Answer:   screening  °  Order Specific Question:   Preferred imaging location?  °  Answer:   [facility]  ° °All questions were answered. The patient knows to call the clinic with any problems, questions or concerns. No barriers to learning was detected. °The total time spent in the appointment was 30 minutes. ° °  ° Yan Feng, MD °07/21/2021  ° °I, Katie Daubenspeck, am acting as scribe for Yan Feng, MD.  ° °I have reviewed the above documentation for accuracy and completeness, and I agree with the above. °  ° ° °

## 2021-07-22 ENCOUNTER — Encounter: Payer: Self-pay | Admitting: Hematology

## 2021-07-25 ENCOUNTER — Other Ambulatory Visit: Payer: Self-pay | Admitting: Hematology

## 2021-07-25 DIAGNOSIS — Z170 Estrogen receptor positive status [ER+]: Secondary | ICD-10-CM

## 2021-08-09 DIAGNOSIS — M810 Age-related osteoporosis without current pathological fracture: Secondary | ICD-10-CM | POA: Diagnosis not present

## 2021-08-09 DIAGNOSIS — R7309 Other abnormal glucose: Secondary | ICD-10-CM | POA: Diagnosis not present

## 2021-08-09 DIAGNOSIS — E0590 Thyrotoxicosis, unspecified without thyrotoxic crisis or storm: Secondary | ICD-10-CM | POA: Diagnosis not present

## 2021-08-16 DIAGNOSIS — E041 Nontoxic single thyroid nodule: Secondary | ICD-10-CM | POA: Diagnosis not present

## 2021-08-16 DIAGNOSIS — E0590 Thyrotoxicosis, unspecified without thyrotoxic crisis or storm: Secondary | ICD-10-CM | POA: Diagnosis not present

## 2021-08-16 DIAGNOSIS — Z6828 Body mass index (BMI) 28.0-28.9, adult: Secondary | ICD-10-CM | POA: Diagnosis not present

## 2021-08-16 DIAGNOSIS — M810 Age-related osteoporosis without current pathological fracture: Secondary | ICD-10-CM | POA: Diagnosis not present

## 2021-08-16 DIAGNOSIS — E785 Hyperlipidemia, unspecified: Secondary | ICD-10-CM | POA: Diagnosis not present

## 2021-08-16 DIAGNOSIS — C50912 Malignant neoplasm of unspecified site of left female breast: Secondary | ICD-10-CM | POA: Diagnosis not present

## 2021-08-16 DIAGNOSIS — E0500 Thyrotoxicosis with diffuse goiter without thyrotoxic crisis or storm: Secondary | ICD-10-CM | POA: Diagnosis not present

## 2021-08-17 DIAGNOSIS — Z124 Encounter for screening for malignant neoplasm of cervix: Secondary | ICD-10-CM | POA: Diagnosis not present

## 2021-08-17 DIAGNOSIS — F419 Anxiety disorder, unspecified: Secondary | ICD-10-CM | POA: Diagnosis not present

## 2021-08-18 ENCOUNTER — Inpatient Hospital Stay: Payer: Medicare HMO

## 2021-10-24 DIAGNOSIS — E0590 Thyrotoxicosis, unspecified without thyrotoxic crisis or storm: Secondary | ICD-10-CM | POA: Diagnosis not present

## 2021-10-31 DIAGNOSIS — E0590 Thyrotoxicosis, unspecified without thyrotoxic crisis or storm: Secondary | ICD-10-CM | POA: Diagnosis not present

## 2021-10-31 DIAGNOSIS — M810 Age-related osteoporosis without current pathological fracture: Secondary | ICD-10-CM | POA: Diagnosis not present

## 2021-10-31 DIAGNOSIS — C50912 Malignant neoplasm of unspecified site of left female breast: Secondary | ICD-10-CM | POA: Diagnosis not present

## 2021-10-31 DIAGNOSIS — E041 Nontoxic single thyroid nodule: Secondary | ICD-10-CM | POA: Diagnosis not present

## 2021-10-31 DIAGNOSIS — E0500 Thyrotoxicosis with diffuse goiter without thyrotoxic crisis or storm: Secondary | ICD-10-CM | POA: Diagnosis not present

## 2021-10-31 DIAGNOSIS — Z6828 Body mass index (BMI) 28.0-28.9, adult: Secondary | ICD-10-CM | POA: Diagnosis not present

## 2021-10-31 DIAGNOSIS — E785 Hyperlipidemia, unspecified: Secondary | ICD-10-CM | POA: Diagnosis not present

## 2021-11-23 DIAGNOSIS — L298 Other pruritus: Secondary | ICD-10-CM | POA: Diagnosis not present

## 2021-11-23 DIAGNOSIS — L821 Other seborrheic keratosis: Secondary | ICD-10-CM | POA: Diagnosis not present

## 2021-11-23 DIAGNOSIS — D225 Melanocytic nevi of trunk: Secondary | ICD-10-CM | POA: Diagnosis not present

## 2021-11-23 DIAGNOSIS — L814 Other melanin hyperpigmentation: Secondary | ICD-10-CM | POA: Diagnosis not present

## 2021-11-23 DIAGNOSIS — R208 Other disturbances of skin sensation: Secondary | ICD-10-CM | POA: Diagnosis not present

## 2021-11-23 DIAGNOSIS — L718 Other rosacea: Secondary | ICD-10-CM | POA: Diagnosis not present

## 2021-11-23 DIAGNOSIS — L538 Other specified erythematous conditions: Secondary | ICD-10-CM | POA: Diagnosis not present

## 2021-11-23 DIAGNOSIS — D485 Neoplasm of uncertain behavior of skin: Secondary | ICD-10-CM | POA: Diagnosis not present

## 2021-11-23 DIAGNOSIS — L820 Inflamed seborrheic keratosis: Secondary | ICD-10-CM | POA: Diagnosis not present

## 2021-11-24 ENCOUNTER — Ambulatory Visit
Admission: RE | Admit: 2021-11-24 | Discharge: 2021-11-24 | Disposition: A | Discharge status: Home / Self Care | Payer: Medicare HMO | Source: Ambulatory Visit | Attending: Hematology | Admitting: Hematology

## 2021-11-24 DIAGNOSIS — Z853 Personal history of malignant neoplasm of breast: Secondary | ICD-10-CM | POA: Diagnosis not present

## 2021-11-24 DIAGNOSIS — R928 Other abnormal and inconclusive findings on diagnostic imaging of breast: Secondary | ICD-10-CM | POA: Diagnosis not present

## 2021-11-24 DIAGNOSIS — C50212 Malignant neoplasm of upper-inner quadrant of left female breast: Secondary | ICD-10-CM

## 2022-01-22 ENCOUNTER — Encounter (HOSPITAL_COMMUNITY): Payer: Self-pay | Admitting: Emergency Medicine

## 2022-01-22 ENCOUNTER — Emergency Department (HOSPITAL_COMMUNITY): Payer: Medicare HMO

## 2022-01-22 ENCOUNTER — Emergency Department (HOSPITAL_COMMUNITY)
Admission: EM | Admit: 2022-01-22 | Discharge: 2022-01-22 | Disposition: A | Discharge status: Home / Self Care | Payer: Medicare HMO | Attending: Emergency Medicine | Admitting: Emergency Medicine

## 2022-01-22 DIAGNOSIS — S299XXA Unspecified injury of thorax, initial encounter: Secondary | ICD-10-CM | POA: Diagnosis not present

## 2022-01-22 DIAGNOSIS — W228XXA Striking against or struck by other objects, initial encounter: Secondary | ICD-10-CM | POA: Diagnosis not present

## 2022-01-22 DIAGNOSIS — R0781 Pleurodynia: Secondary | ICD-10-CM | POA: Diagnosis not present

## 2022-01-22 MED ORDER — NAPROXEN 375 MG PO TABS: 375.0000 mg | ORAL_TABLET | Freq: Two times a day (BID) | ORAL | 0 refills | Status: DC

## 2022-01-22 MED ORDER — HYDROCODONE-ACETAMINOPHEN 5-325 MG PO TABS
1.0000 | ORAL_TABLET | Freq: Once | ORAL | Status: AC
  Administered 2022-01-22: 1 via ORAL
  Filled 2022-01-22: qty 1

## 2022-01-22 MED ORDER — OXYCODONE HCL 5 MG PO TABS: 5.0000 mg | ORAL_TABLET | Freq: Four times a day (QID) | ORAL | 0 refills | Status: AC | PRN

## 2022-01-22 NOTE — ED Provider Notes (Signed)
[facility] DEPT Provider Note   CSN: [id number] Arrival date & time: 01/22/22  1757     History  Chief Complaint  Patient presents with   Lytle Michaels    [name] is a 71 y.o. female.  71 year old female presents today for evaluation of left chest wall pain.  She states she was walking in her bedroom and dark and went to sit on her bed and fell onto her face and which struck her in her left chest wall.  She reports significant pain since.  States pain is worse with taking a deep breath.  Denies any other injuries.  Did not hit her head or lose consciousness.  The history is provided by the patient. No language interpreter was used.       Home Medications Prior to Admission medications   Medication Sig Start Date End Date Taking? Authorizing Provider  anastrozole (ARIMIDEX) 1 MG tablet Take 1 tablet (1 mg total) by mouth daily. 07/21/21   [name], MD  Ascorbic Acid (VITAMIN C) 1000 MG tablet Take 2,000 mg by mouth daily.     [provider]  Calcium Carb-Cholecalciferol (CALCIUM 600 + D PO) Take 2 tablets by mouth daily.     [provider]  cholecalciferol (VITAMIN D3) 25 MCG (1000 UT) tablet Take 1,000 Units by mouth daily.     [provider]  Melatonin 10 MG CAPS Take 20 mg by mouth at bedtime as needed (sleep).     [provider]  methimazole (TAPAZOLE) 5 MG tablet Take 5 mg by mouth daily. 03/19/19   [provider]  Multiple Vitamin (MULTIVITAMIN WITH MINERALS) TABS tablet Take 1 tablet by mouth daily.     [provider]  Naproxen Sod-diphenhydrAMINE 220-25 MG TABS Take 2 tablets by mouth at bedtime as needed (sleep).    [provider]  sertraline (ZOLOFT) 100 MG tablet Take 200 mg by mouth daily.  09/03/13   [provider]  simvastatin (ZOCOR) 40 MG tablet Take 40 mg by mouth every evening.    [provider]  vitamin E 100 UNIT capsule Take 200 Units by mouth  daily.     [provider]      Allergies    Docetaxel    Review of Systems   Review of Systems  Respiratory:  Negative for cough and shortness of breath.   Cardiovascular:  Positive for chest pain (Left-sided chest wall pain). Negative for palpitations and leg swelling.  Skin:  Negative for wound.  All other systems reviewed and are negative.   Physical Exam Updated Vital Signs BP 114/71 (BP Location: Right Arm)   Pulse 64   Temp 98 F (36.7 C) (Oral)   Resp 16   SpO2 98%  Physical Exam Vitals and nursing note reviewed.  Constitutional:      General: She is not in acute distress.    Appearance: Normal appearance. She is not ill-appearing.  HENT:     Head: Normocephalic and atraumatic.     Nose: Nose normal.  Eyes:     General: No scleral icterus.    Extraocular Movements: Extraocular movements intact.     Conjunctiva/sclera: Conjunctivae normal.  Cardiovascular:     Rate and Rhythm: Normal rate and regular rhythm.     Pulses: Normal pulses.     Heart sounds: Normal heart sounds.  Pulmonary:     Effort: Pulmonary effort is normal. No respiratory distress.     Breath sounds:  Normal breath sounds. No wheezing or rales.  Abdominal:     General: There is no distension.     Tenderness: There is no abdominal tenderness.  Musculoskeletal:        General: No deformity. Normal range of motion.     Cervical back: Normal range of motion.     Comments: Left-sided chest wall pain present below the left breast.  Skin:    General: Skin is warm and dry.     Findings: No rash.  Neurological:     General: No focal deficit present.     Mental Status: She is alert. Mental status is at baseline.     ED Results / Procedures / Treatments   Labs (all labs ordered are listed, but only abnormal results are displayed) Labs Reviewed - No data to display  EKG None  Radiology No results found.  Procedures Procedures    Medications Ordered in ED Medications   HYDROcodone-acetaminophen (NORCO/VICODIN) 5-325 MG per tablet 1 tablet (has no administration in time range)    ED Course/ Medical Decision Making/ A&P                           Medical Decision Making Amount and/or Complexity of Data Reviewed Radiology: ordered.  Risk Prescription drug management.   71 year old female presents today following a fall that occurred yesterday.  Denies head injury, or loss of consciousness.  Does have tenderness to palpation present below the left breast.  Pain worse with deep breathing.  Low risk for ACS or PE.  X-ray without acute fracture or other concerning findings.  Given clinical picture and history patient likely has with fracture or rib contusion.  Will provide pain medication, incentive spirometer.  Symptomatic treatment discussed.  Return precautions discussed.  Discussed follow-up with PCP.   Final Clinical Impression(s) / ED Diagnoses Final diagnoses:  Traumatic injury of rib    Rx / DC Orders ED Discharge Orders          Ordered    naproxen (NAPROSYN) 375 MG tablet  2 times daily        01/22/22 2039    oxyCODONE (ROXICODONE) 5 MG immediate release tablet  Every 6 hours PRN        01/22/22 2039              Evlyn Courier, PA-C 01/22/22 2040    Milton Ferguson, MD 01/23/22 1031

## 2022-01-22 NOTE — ED Triage Notes (Signed)
Patient c/o L rib and shoulder pain after trip and fall over fan last night. Denies head injury and LOC. Reports taking a valium and tizanidine without relief.

## 2022-01-22 NOTE — ED Notes (Signed)
Pt d/c home with visitor who is discharge ride home. Discharge summary reviewed with pt, pt verbalizes understanding. Ambulatory off unit. No s/s of acute distress noted at discharge.

## 2022-01-22 NOTE — Discharge Instructions (Addendum)
Your x-ray did not show any concerning findings.  Given your exam and history I do believe you likely have either a rib fracture, or contusion.  These are painful and take a few weeks to heal.  I have sent naproxen as well as pain medication into the pharmacy for you.  You also received an incentive spirometer.  Ensure that you use this frequently throughout the day.  Follow-up with your primary care provider.  If you have any worsening or concerning symptoms you can return for evaluation.

## 2022-01-22 NOTE — ED Notes (Signed)
Patient transported to X-ray 

## 2022-01-24 DIAGNOSIS — R0789 Other chest pain: Secondary | ICD-10-CM | POA: Diagnosis not present

## 2022-02-15 ENCOUNTER — Other Ambulatory Visit: Payer: Self-pay

## 2022-02-15 NOTE — Progress Notes (Signed)
[facility]   [phone] [phone]) [phone]   Clinic Follow up Note   Patient Care Team: [name], DO as PCP - General (Family Medicine) Erroll Luna, MD as Consulting Physician (General Surgery) [name], MD as Consulting Physician (Hematology) Thea Silversmith, MD as Consulting Physician (Radiation Oncology) Rockwell Germany, RN as Registered Nurse Mauro Kaufmann, RN as Registered Nurse Holley Bouche, NP (Inactive) as Nurse Practitioner (Nurse Practitioner) Sylvan Cheese, NP as Nurse Practitioner (Nurse Practitioner) Madelin Rear, MD (Inactive) as Consulting Physician (Endocrinology) Alla Feeling, NP as Nurse Practitioner (Nurse Practitioner)  Date of Service:  02/16/2022  CHIEF COMPLAINT: f/u of bilateral breast cancer  CURRENT THERAPY:  Antiestrogen therapy, starting 08/2019  -took for ~2 months neoadjuvantly  -currently on anastrozole since 07/2021  ASSESSMENT & PLAN:  [name] is a 71 y.o. post-menopausal female with   1. Malignant neoplasm of upper-inner quadrant of left breast, invasive lobular carcinoma, Stage IA, pT1c,N0,M0, ER+/PR+, HER2-, Grade II, RS 33 -diagnosed in 10/2018, started neoadjuvant anastrozole in 11/2018 due to the delay of her surgery due to [place] pandemic.  -s/p left breast lumpectomy on 01/07/19, oncotype RS 33, high risk. S/p 4 cycles of adjuvant chemo TC and adjuvant Radiation. -She restarted antiestrogen therapy with Exemestane in 08/2019. Tolerated well but switched back to anastrozole in 07/2021 due to high cost of exemestane. -last mammogram 11/24/21 was benign. -She is clinically doing well. Lab reviewed, her CBC and CMP are within normal limits. Her physical exam was unremarkable. There is no clinical concern for recurrence. -continue surveillance. Next mammogram in 11/2022.  -F/u in 7 months.   2. Osteopenia  -Her last DEXA scan was 03/2019, which showed osteopenia in hips  (T-score -2.2). Repeat in 03/2021 showed some worsening osteopenia with T-score of -2.4 with risk of hip fracture 3.3% in next 10 years  -she is scheduled to begin Zometa infusion today, but she wants to postpone due to her recent fall.  We again discussed the benefit and potential side effects from Zometa, she agrees to start   3. H/o Right breast DCIS, ER-/PR-, grade 3 -diagnosed in 09/2014. S/p right lumpectomy with SLNB and adjuvant radiation. -MM 11/24/21 was benign.   4. Genetic testing was negative for pathogenetic mutations     PLAN: -Patient wants to postpone her Zometa to 3 to 4 weeks later -continue anastrozole, refilled today -lab and F/u in 7 months    No problem-specific Assessment & Plan notes found for this encounter.   SUMMARY OF ONCOLOGIC HISTORY: Oncology History Overview Note  Cancer Staging Breast cancer of upper-inner quadrant of right female breast [facility]) Staging form: Breast, AJCC 7th Edition - Clinical stage from 09/23/2014: Stage 0 (Tis (DCIS), N0, M0) - Unsigned - Pathologic stage from 10/22/2014: Stage 0 (Tis (DCIS), N0, cM0) - Signed by [name], MD on 11/03/2014 Staging comments: Staged on final lumpectomy specimen by Dr. [name]  Malignant neoplasm of upper-inner quadrant of left breast in female, estrogen receptor positive ([place]) Staging form: Breast, AJCC 8th Edition - Clinical: cT1b, cN0, cM0, G2, ER+, PR+ - Unsigned    Breast cancer of upper-inner quadrant of right female breast ([place])  09/11/2014 Mammogram   Right breast: area of pleomorphic calcifications and associated architectural distortion, with irregular density at the 1 o'clock position of the right breast. No other suspicious findings in the right breast.    09/11/2014 Breast US   Right breast: area of hypoechogenecity 2 cm x 0.9 cm  x 1.6 cm in size, 12 o'clock position, 3 cm the nipple. No discrete suspicious mass.   09/11/2014 Initial Biopsy   Right breast needle core bx (11-12 o'clock, UOQ):  DCIS, grade 3, with necrosis and calcifications, ER- (0%), PR- (0%)   09/11/2014 Pathologic Stage   Stage 0: Tis Nx    09/17/2014 Breast MRI   Non mass enhancement in the upper and slight outer right breast measuring approximately 2.6 x 1.7 x 2.2 cm. Biopsy proven fibrocystic changes in the upper outer periareolar right breast manifested as a 9 x 7 mm mass.   09/24/2014 Procedure   Genetic testing: BRCA plus and BreastNext panels ([place]) performed showing no clinically significant variants detected including ATM, BARD1, BRCA1, BRCA2, BRIP1, CDH1, CHEK2, MRE11A, MUTYH, NBN, NF1, PALB2, PTEN, RAD50, RAD51C, RAD51D, and TP53.   10/20/2014 Definitive Surgery   Right lumpectomy with SLNB ([place]): DCIS with necrosis and calcifications, grade 3, 1 mm from nearest margin (posterior), 0/2 LN with evidence of malignancy.   10/20/2014 Clinical Stage   Stage 0: Tis N0   11/25/2014 - 01/06/2015 Radiation Therapy   Adjuvant RT completed [name]): Right breast 50 Gy over 25 fractions; right breast boost 10 Gy over 5 fractions. Total dose received: 60 Gy.    04/13/2015 Survivorship   Survivorship care plan completed and copy mailed to patient as she was unable to be seen in the Survivorship clinic due to patient's schedule.   11/12/2018 Genetic Testing   Negative genetic testing on the common hereditary cancer panel.  The Hereditary Gene Panel offered by [facility] includes sequencing and/or deletion duplication testing of the following 48 genes: APC, ATM, AXIN2, BARD1, BMPR1A, BRCA1, BRCA2, BRIP1, CDH1, CDK4, CDKN2A (p14ARF), CDKN2A (p16INK4a), CHEK2, CTNNA1, DICER1, EPCAM (Deletion/duplication testing only), GREM1 (promoter region deletion/duplication testing only), KIT, MEN1, MLH1, MSH2, MSH3, MSH6, MUTYH, NBN, NF1, NHTL1, PALB2, PDGFRA, PMS2, POLD1, POLE, PTEN, RAD50, RAD51C, RAD51D, RNF43, SDHB, SDHC, SDHD, SMAD4, SMARCA4. STK11, TP53, TSC1, TSC2, and VHL.  The following genes were evaluated for sequence changes  only: SDHA and HOXB13 c.251G>A variant only. The report date is 11/12/2018.  AXIN2 c.815+fdup ([employer]) VUS identified.  This is still a normal result and will not change medical management.   11/05/2020 Mammogram   IMPRESSION: 1. Indeterminate calcifications at the right lumpectomy site spanning 1.0 cm.   2.  Stable postsurgical changes in the left breast.   01/10/2021 Surgery   RIGHT BREAST LUMPECTOMY to remove scar tissue and calcifications by Dr Ninfa Linden   FINAL MICROSCOPIC DIAGNOSIS:   A. BREAST, RIGHT, LUMPECTOMY:  - Benign skin and underlying breast parenchyma with previous  procedure-related changes, including dystrophic calcifications  - Mild fibrocystic change  - Negative for carcinoma    Malignant neoplasm of upper-inner quadrant of left breast in female, estrogen receptor positive ([place])  10/18/2018 Initial Diagnosis   Malignant neoplasm of upper-inner quadrant of left breast in female, estrogen receptor positive ([place])   10/18/2018 Initial Biopsy   Diagnosis Breast, left, needle core biopsy, 10 o'clock, 4 cm fn - INVASIVE MAMMARY CARCINOMA - MAMMARY CARCINOMA IN SITU - SEE COMMENT   10/18/2018 Receptors her2   ER: 100% with strong staining  PR: 80% with strong staining  HER2 Negative    10/18/2018 Imaging   Diagnostic Mammogram 10/18/18  IMPRESSION 2 hypoechoic masses in the left breast at 10 o'clock, 4 cm from the nipple. The larger mass measures 5 x 6 x 5 mm. The second mass measures 3 x 5 x 5 mm. The  masses are 7 mm apart and span a total dimension 1.7 cm. No axillary adenopathy on the left.   10/18/2018 Cancer Staging   Staging form: Breast, AJCC 8th Edition - Clinical stage from 10/18/2018: Stage IA (cT1b, cN0, cM0, G2, ER+, PR+, HER2-) - Signed by [name], MD on 11/07/2018   10/30/2018 Breast MRI   Breast MRI 10/30/18  IMPRESSION: Biopsy-proven malignancy within the left breast 10 o'clock position. No additional suspicious areas of enhancement identified  within either breast.   11/2018 -  Anti-estrogen oral therapy   Anastrozole $RemoveBefo'1mg'caqqNgFrnJg$  daily starting 11/2018 (before surgery). Held after 01/27/19 for further cancer treatment.    11/12/2018 Genetic Testing   Negative genetic testing on the common hereditary cancer panel.  The Hereditary Gene Panel offered by [facility] includes sequencing and/or deletion duplication testing of the following 48 genes: APC, ATM, AXIN2, BARD1, BMPR1A, BRCA1, BRCA2, BRIP1, CDH1, CDK4, CDKN2A (p14ARF), CDKN2A (p16INK4a), CHEK2, CTNNA1, DICER1, EPCAM (Deletion/duplication testing only), GREM1 (promoter region deletion/duplication testing only), KIT, MEN1, MLH1, MSH2, MSH3, MSH6, MUTYH, NBN, NF1, NHTL1, PALB2, PDGFRA, PMS2, POLD1, POLE, PTEN, RAD50, RAD51C, RAD51D, RNF43, SDHB, SDHC, SDHD, SMAD4, SMARCA4. STK11, TP53, TSC1, TSC2, and VHL.  The following genes were evaluated for sequence changes only: SDHA and HOXB13 c.251G>A variant only. The report date is 11/12/2018.  AXIN2 c.815+fdup ([employer]) VUS identified.  This is still a normal result and will not change medical management.   01/07/2019 Surgery   LEFT BREAST LUMPECTOMY WITH RADIOACTIVE SEED AND LEFT DEEP AXILLARY SENTINEL LYMPH NODE BIOPSY WITH BLUE DYE INJECTION by Dr Dalbert Batman 01/07/19    01/07/2019 Pathology Results   Diagnosis 01/07/19 1. Breast, lumpectomy, Left w/seed - INVASIVE LOBULAR CARCINOMA, GRADE 2, 1.5 CM. SEE NOTE - CARCINOMA IS 0.4 CM FROM THE SUPERIOR MARGIN AND 0.6 CM FROM THE INFERIOR MARGIN - NO EVIDENCE OF LYMPHOVASCULAR OR PERINEURAL INVASION - BIOPSY SITE CHANGES - SEE ONCOLOGY TABLE 2. Lymph node, sentinel, biopsy, Left axillary #1 - LYMPH NODE, NEGATIVE FOR CARCINOMA (0/1). SEE NOTE 3. Lymph node, sentinel, biopsy, Left axillary #2 - LYMPH NODE, NEGATIVE FOR CARCINOMA (0/1). SEE NOTE 4. Lymph node, sentinel, biopsy, Left - LYMPH NODE, NEGATIVE FOR CARCINOMA (0/1). SEE NOTE 5. Lymph node, sentinel, biopsy, Left - LYMPH NODE, NEGATIVE FOR CARCINOMA  (0/1). SEE NOTE 6. Lymph node, sentinel, biopsy, Left axillary #3 - LYMPH NODE, NEGATIVE FOR CARCINOMA (0/1). SEE NOTE   01/07/2019 Cancer Staging   Staging form: Breast, AJCC 8th Edition - Pathologic stage from 01/07/2019: Stage IA (pT1c, pN0, cM0, G2, ER+, PR+, HER2-, Oncotype DX score: 33) - Signed by [name], MD on 01/27/2019   01/21/2019 Oncotype testing   RS: 33, 21% distant recurrence risk at 9 years with tamoxifen alone; indicates >15% chemotherapy benefit    02/19/2019 - 05/14/2019 Adjuvant Chemotherapy   Adjuvant TC every 3 weeks for 4 cycles starting 02/19/19. She had allergic reation to Taxol with C2 during infusion. She was able to complete Cytoxan that cycle but not much Taxol. I switched taxol to Abraxane with C3. Completed chemo on 05/14/19.    06/17/2019 - 08/06/2019 Radiation Therapy   Adjuvant Radiation with Dr Sondra Come 06/17/19-08/06/19    08/2019 -  Anti-estrogen oral therapy   Started adjuvant Exemestane 08/2019    10/20/2019 Survivorship   SCP delivered by [name], NP       INTERVAL HISTORY:  [name] is here for a follow up of breast cancer. She was last seen by me on 07/21/21. She presents  to the clinic alone. She had fall 3 weeks ago, still has some pain on left chest wall.  She is tolerating anastrozole well, no arthralgia.   She report numbness on left leg, which has been for a few years    All other systems were reviewed with the patient and are negative.  MEDICAL HISTORY:  Past Medical History:  Diagnosis Date   Allergy    Breast cancer ([place]) 09/11/14   right breast   Breast cancer in female [facility]) 01/2019   Breast cancer of upper-inner quadrant of right female breast ([place]) 09/15/2014   Family history of breast cancer    Family history of colon cancer    Family history of pancreatic cancer    Hot flashes    Hyperlipidemia    Hyperthyroidism    Personal history of chemotherapy 2020   left breast   Personal history of radiation therapy 2016    Personal history of radiation therapy 2020   Radiation 11/25/14-01/06/15   Right Breast    SURGICAL HISTORY: Past Surgical History:  Procedure Laterality Date   AUGMENTATION MAMMAPLASTY Bilateral 03/10/1997   BREAST BIOPSY Right 09/11/2014   BREAST BIOPSY Left 10/21/2018   BREAST LUMPECTOMY Right 10/20/2014   BREAST LUMPECTOMY Left 01/11/2019   BREAST LUMPECTOMY Right 01/10/2021   Procedure: RIGHT BREAST LUMPECTOMY;  Surgeon: Coralie Keens, MD;  Location: [facility];  Service: General;  Laterality: Right;   BREAST LUMPECTOMY WITH RADIOACTIVE SEED AND SENTINEL LYMPH NODE BIOPSY N/A 01/07/2019   Procedure: LEFT BREAST LUMPECTOMY WITH RADIOACTIVE SEED AND LEFT DEEP AXILLARY SENTINEL LYMPH NODE BIOPSY WITH BLUE DYE INJECTION;  Surgeon: Fanny Skates, MD;  Location: [facility];  Service: General;  Laterality: N/A;   COLONOSCOPY     DILATION AND CURETTAGE OF UTERUS     KNEE ARTHROSCOPY     PLACEMENT OF BREAST IMPLANTS  1995   bilat breast implants   TONSILLECTOMY     TUBAL LIGATION     WISDOM TOOTH EXTRACTION      I have reviewed the social history and family history with the patient and they are unchanged from previous note.  ALLERGIES:  is allergic to docetaxel.  MEDICATIONS:  Current Outpatient Medications  Medication Sig Dispense Refill   anastrozole (ARIMIDEX) 1 MG tablet Take 1 tablet (1 mg total) by mouth daily. 90 tablet 1   Ascorbic Acid (VITAMIN C) 1000 MG tablet Take 2,000 mg by mouth daily.      Calcium Carb-Cholecalciferol (CALCIUM 600 + D PO) Take 2 tablets by mouth daily.      cholecalciferol (VITAMIN D3) 25 MCG (1000 UT) tablet Take 1,000 Units by mouth daily.      Melatonin 10 MG CAPS Take 20 mg by mouth at bedtime as needed (sleep).      methimazole (TAPAZOLE) 5 MG tablet Take 5 mg by mouth daily.     Multiple Vitamin (MULTIVITAMIN WITH MINERALS) TABS tablet Take 1 tablet by mouth daily.      naproxen (NAPROSYN) 375 MG tablet Take 1 tablet (375 mg total)  by mouth 2 (two) times daily. 20 tablet 0   Naproxen Sod-diphenhydrAMINE 220-25 MG TABS Take 2 tablets by mouth at bedtime as needed (sleep).     oxyCODONE (ROXICODONE) 5 MG immediate release tablet Take 1 tablet (5 mg total) by mouth every 6 (six) hours as needed for severe pain or breakthrough pain. 15 tablet 0   sertraline (ZOLOFT) 100 MG tablet Take 200 mg by mouth daily.  simvastatin (ZOCOR) 40 MG tablet Take 40 mg by mouth every evening.     vitamin E 100 UNIT capsule Take 200 Units by mouth daily.      No current facility-administered medications for this visit.    PHYSICAL EXAMINATION: ECOG PERFORMANCE STATUS: 1 - Symptomatic but completely ambulatory  Vitals:   02/16/22 1016  BP: (!) 152/76  Pulse: 63  Resp: 18  Temp: 98.4 F (36.9 C)  SpO2: 98%   Wt Readings from Last 3 Encounters:  02/16/22 147 lb 11.2 oz (67 kg)  07/21/21 149 lb 12.8 oz (67.9 kg)  01/19/21 153 lb 1.6 oz (69.4 kg)     GENERAL:alert, no distress and comfortable SKIN: skin color, texture, turgor are normal, no rashes or significant lesions EYES: normal, Conjunctiva are pink and non-injected, sclera clear  NECK: supple, thyroid normal size, non-tender, without nodularity LYMPH:  no palpable lymphadenopathy in the cervical, axillary LUNGS: clear to auscultation and percussion with normal breathing effort HEART: regular rate & rhythm and no murmurs and no lower extremity edema ABDOMEN:abdomen soft, non-tender and normal bowel sounds Musculoskeletal:no cyanosis of digits and no clubbing  NEURO: alert & oriented x 3 with fluent speech, no focal motor/sensory deficits BREAST: Status post bilateral lumpectomy.  Incisions healed very well.  No palpable mass, nodules or adenopathy bilaterally. Breast exam benign.   LABORATORY DATA:  I have reviewed the data as listed    Latest Ref Rng & Units 02/16/2022    9:56 AM 07/21/2021   10:40 AM 01/19/2021   11:01 AM  CBC  WBC 4.0 - 10.5 K/uL 6.2  6.7  8.0    Hemoglobin 12.0 - 15.0 g/dL 13.9  14.2  14.8   Hematocrit 36.0 - 46.0 % 41.8  42.3  44.8   Platelets 150 - 400 K/uL 207  231  223         Latest Ref Rng & Units 02/16/2022    9:56 AM 07/21/2021   10:40 AM 01/19/2021   11:01 AM  CMP  Glucose 70 - 99 mg/dL 101  108  102   BUN 8 - 23 mg/dL $Remove'19  18  18   'CoXpNCO$ Creatinine 0.44 - 1.00 mg/dL 0.73  0.77  0.73   Sodium 135 - 145 mmol/L 143  143  142   Potassium 3.5 - 5.1 mmol/L 4.6  4.3  4.6   Chloride 98 - 111 mmol/L 109  108  107   CO2 22 - 32 mmol/L $RemoveB'30  27  26   'nRCUflKN$ Calcium 8.9 - 10.3 mg/dL 9.6  9.0  9.7   Total Protein 6.5 - 8.1 g/dL 6.9  6.9  7.3   Total Bilirubin 0.3 - 1.2 mg/dL 0.4  0.5  0.5   Alkaline Phos 38 - 126 U/L 82  88  109   AST 15 - 41 U/L $Remo'20  17  16   'DZdMh$ ALT 0 - 44 U/L $Remo'21  18  17       'SqOMx$ RADIOGRAPHIC STUDIES: I have personally reviewed the radiological images as listed and agreed with the findings in the report. No results found.    No orders of the defined types were placed in this encounter.  All questions were answered. The patient knows to call the clinic with any problems, questions or concerns. No barriers to learning was detected. The total time spent in the appointment was 30 minutes.     [name], MD 02/16/2022   I, Wilburn Mylar, am acting as scribe for [employer]  Burr Medico, MD.   I have reviewed the above documentation for accuracy and completeness, and I agree with the above.

## 2022-02-16 ENCOUNTER — Inpatient Hospital Stay: Payer: Medicare HMO

## 2022-02-16 ENCOUNTER — Other Ambulatory Visit: Payer: Self-pay

## 2022-02-16 ENCOUNTER — Inpatient Hospital Stay: Payer: Medicare HMO | Attending: Hematology

## 2022-02-16 ENCOUNTER — Inpatient Hospital Stay: Payer: Medicare HMO | Admitting: Hematology

## 2022-02-16 VITALS — BP 152/76 | HR 63 | Temp 98.4°F | Resp 18 | Ht 60.0 in | Wt 147.7 lb

## 2022-02-16 DIAGNOSIS — C50212 Malignant neoplasm of upper-inner quadrant of left female breast: Secondary | ICD-10-CM | POA: Diagnosis not present

## 2022-02-16 DIAGNOSIS — Z79811 Long term (current) use of aromatase inhibitors: Secondary | ICD-10-CM | POA: Insufficient documentation

## 2022-02-16 DIAGNOSIS — Z170 Estrogen receptor positive status [ER+]: Secondary | ICD-10-CM | POA: Diagnosis not present

## 2022-02-16 DIAGNOSIS — M8589 Other specified disorders of bone density and structure, multiple sites: Secondary | ICD-10-CM | POA: Insufficient documentation

## 2022-02-16 LAB — CBC WITH DIFFERENTIAL (CANCER CENTER ONLY)
Abs Immature Granulocytes: 0.01 10*3/uL (ref 0.00–0.07)
Basophils Absolute: 0 10*3/uL (ref 0.0–0.1)
Basophils Relative: 1 %
Eosinophils Absolute: 0 10*3/uL (ref 0.0–0.5)
Eosinophils Relative: 1 %
HCT: 41.8 % (ref 36.0–46.0)
Hemoglobin: 13.9 g/dL (ref 12.0–15.0)
Immature Granulocytes: 0 %
Lymphocytes Relative: 28 %
Lymphs Abs: 1.7 10*3/uL (ref 0.7–4.0)
MCH: 29.7 pg (ref 26.0–34.0)
MCHC: 33.3 g/dL (ref 30.0–36.0)
MCV: 89.3 fL (ref 80.0–100.0)
Monocytes Absolute: 0.4 10*3/uL (ref 0.1–1.0)
Monocytes Relative: 6 %
Neutro Abs: 4 10*3/uL (ref 1.7–7.7)
Neutrophils Relative %: 64 %
Platelet Count: 207 10*3/uL (ref 150–400)
RBC: 4.68 MIL/uL (ref 3.87–5.11)
RDW: 12.1 % (ref 11.5–15.5)
WBC Count: 6.2 10*3/uL (ref 4.0–10.5)
nRBC: 0 % (ref 0.0–0.2)

## 2022-02-16 LAB — CMP (CANCER CENTER ONLY)
ALT: 21 U/L (ref 0–44)
AST: 20 U/L (ref 15–41)
Albumin: 4.2 g/dL (ref 3.5–5.0)
Alkaline Phosphatase: 82 U/L (ref 38–126)
Anion gap: 4 — ABNORMAL LOW (ref 5–15)
BUN: 19 mg/dL (ref 8–23)
CO2: 30 mmol/L (ref 22–32)
Calcium: 9.6 mg/dL (ref 8.9–10.3)
Chloride: 109 mmol/L (ref 98–111)
Creatinine: 0.73 mg/dL (ref 0.44–1.00)
GFR, Estimated: >60 mL/min (ref >60)
Glucose, Bld: 101 mg/dL — ABNORMAL HIGH (ref 70–99)
Potassium: 4.6 mmol/L (ref 3.5–5.1)
Sodium: 143 mmol/L (ref 135–145)
Total Bilirubin: 0.4 mg/dL (ref 0.3–1.2)
Total Protein: 6.9 g/dL (ref 6.5–8.1)

## 2022-02-16 MED ORDER — ANASTROZOLE 1 MG PO TABS: 1.0000 mg | ORAL_TABLET | Freq: Every day | ORAL | 1 refills | Status: DC

## 2022-02-21 ENCOUNTER — Other Ambulatory Visit: Payer: Self-pay | Admitting: Family Medicine

## 2022-02-21 DIAGNOSIS — Z853 Personal history of malignant neoplasm of breast: Secondary | ICD-10-CM

## 2022-02-21 DIAGNOSIS — R079 Chest pain, unspecified: Secondary | ICD-10-CM

## 2022-02-27 ENCOUNTER — Other Ambulatory Visit: Payer: Self-pay

## 2022-03-02 ENCOUNTER — Ambulatory Visit
Admission: RE | Admit: 2022-03-02 | Discharge: 2022-03-02 | Disposition: A | Discharge status: Home / Self Care | Payer: Medicare HMO | Source: Ambulatory Visit | Attending: Family Medicine | Admitting: Family Medicine

## 2022-03-02 DIAGNOSIS — Z853 Personal history of malignant neoplasm of breast: Secondary | ICD-10-CM | POA: Diagnosis not present

## 2022-03-02 DIAGNOSIS — S2242XA Multiple fractures of ribs, left side, initial encounter for closed fracture: Secondary | ICD-10-CM | POA: Diagnosis not present

## 2022-03-02 DIAGNOSIS — R079 Chest pain, unspecified: Secondary | ICD-10-CM

## 2022-03-02 MED ORDER — IOPAMIDOL (ISOVUE-300) INJECTION 61%
75.0000 mL | Freq: Once | INTRAVENOUS | Status: AC | PRN
  Administered 2022-03-02: 75 mL via INTRAVENOUS

## 2022-03-06 ENCOUNTER — Other Ambulatory Visit: Payer: Self-pay

## 2022-03-13 ENCOUNTER — Inpatient Hospital Stay: Payer: Medicare HMO | Attending: Hematology

## 2022-03-13 ENCOUNTER — Other Ambulatory Visit: Payer: Self-pay

## 2022-03-13 VITALS — BP 158/78 | HR 68 | Temp 98.7°F | Resp 18

## 2022-03-13 DIAGNOSIS — M8580 Other specified disorders of bone density and structure, unspecified site: Secondary | ICD-10-CM | POA: Insufficient documentation

## 2022-03-13 DIAGNOSIS — C50212 Malignant neoplasm of upper-inner quadrant of left female breast: Secondary | ICD-10-CM | POA: Insufficient documentation

## 2022-03-13 MED ORDER — ZOLEDRONIC ACID 4 MG/100ML IV SOLN
4.0000 mg | Freq: Once | INTRAVENOUS | Status: AC
  Administered 2022-03-13: 4 mg via INTRAVENOUS
  Filled 2022-03-13: qty 100

## 2022-03-13 MED ORDER — SODIUM CHLORIDE 0.9 % IV SOLN: Freq: Once | INTRAVENOUS | Status: AC

## 2022-03-13 NOTE — Patient Instructions (Signed)

## 2022-04-21 DIAGNOSIS — E559 Vitamin D deficiency, unspecified: Secondary | ICD-10-CM | POA: Diagnosis not present

## 2022-04-21 DIAGNOSIS — M810 Age-related osteoporosis without current pathological fracture: Secondary | ICD-10-CM | POA: Diagnosis not present

## 2022-04-21 DIAGNOSIS — E0590 Thyrotoxicosis, unspecified without thyrotoxic crisis or storm: Secondary | ICD-10-CM | POA: Diagnosis not present

## 2022-04-21 DIAGNOSIS — E785 Hyperlipidemia, unspecified: Secondary | ICD-10-CM | POA: Diagnosis not present

## 2022-04-21 DIAGNOSIS — R7309 Other abnormal glucose: Secondary | ICD-10-CM | POA: Diagnosis not present

## 2022-04-21 DIAGNOSIS — Z79899 Other long term (current) drug therapy: Secondary | ICD-10-CM | POA: Diagnosis not present

## 2022-04-28 DIAGNOSIS — F329 Major depressive disorder, single episode, unspecified: Secondary | ICD-10-CM | POA: Diagnosis not present

## 2022-04-28 DIAGNOSIS — E785 Hyperlipidemia, unspecified: Secondary | ICD-10-CM | POA: Diagnosis not present

## 2022-04-28 DIAGNOSIS — Z23 Encounter for immunization: Secondary | ICD-10-CM | POA: Diagnosis not present

## 2022-04-28 DIAGNOSIS — E559 Vitamin D deficiency, unspecified: Secondary | ICD-10-CM | POA: Diagnosis not present

## 2022-04-28 DIAGNOSIS — Z0000 Encounter for general adult medical examination without abnormal findings: Secondary | ICD-10-CM | POA: Diagnosis not present

## 2022-04-28 DIAGNOSIS — R0602 Shortness of breath: Secondary | ICD-10-CM | POA: Diagnosis not present

## 2022-06-28 DIAGNOSIS — D3132 Benign neoplasm of left choroid: Secondary | ICD-10-CM | POA: Diagnosis not present

## 2022-08-15 ENCOUNTER — Other Ambulatory Visit: Payer: Self-pay | Admitting: Hematology

## 2022-09-17 NOTE — Progress Notes (Unsigned)
Patient Care Team: [name], DO as PCP - General (Family Medicine) Erroll Luna, MD as Consulting Physician (General Surgery) [name], MD as Consulting Physician (Hematology) Thea Silversmith, MD as Consulting Physician (Radiation Oncology) Rockwell Germany, RN as Registered Nurse Mauro Kaufmann, RN as Registered Nurse Holley Bouche, NP (Inactive) as Nurse Practitioner (Nurse Practitioner) Sylvan Cheese, NP as Nurse Practitioner (Nurse Practitioner) Madelin Rear, MD as Consulting Physician (Endocrinology) Alla Feeling, NP as Nurse Practitioner (Nurse Practitioner)   CHIEF COMPLAINT: Follow up bilateral breast cancer   Oncology History Overview Note  Cancer Staging Breast cancer of upper-inner quadrant of right female breast [facility]) Staging form: Breast, AJCC 7th Edition - Clinical stage from 09/23/2014: Stage 0 (Tis (DCIS), N0, M0) - Unsigned - Pathologic stage from 10/22/2014: Stage 0 (Tis (DCIS), N0, cM0) - Signed by [name], MD on 11/03/2014 Staging comments: Staged on final lumpectomy specimen by Dr. [name]  Malignant neoplasm of upper-inner quadrant of left breast in female, estrogen receptor positive ([place]) Staging form: Breast, AJCC 8th Edition - Clinical: cT1b, cN0, cM0, G2, ER+, PR+ - Unsigned    Breast cancer of upper-inner quadrant of right female breast ([place])  09/11/2014 Mammogram   Right breast: area of pleomorphic calcifications and associated architectural distortion, with irregular density at the 1 o'clock position of the right breast. No other suspicious findings in the right breast.    09/11/2014 Breast US   Right breast: area of hypoechogenecity 2 cm x 0.9 cm x 1.6 cm in size, 12 o'clock position, 3 cm the nipple. No discrete suspicious mass.   09/11/2014 Initial Biopsy   Right breast needle core bx (11-12 o'clock, UOQ): DCIS, grade 3, with necrosis and calcifications, ER- (0%), PR- (0%)   09/11/2014 Pathologic Stage   Stage 0:  Tis Nx    09/17/2014 Breast MRI   Non mass enhancement in the upper and slight outer right breast measuring approximately 2.6 x 1.7 x 2.2 cm. Biopsy proven fibrocystic changes in the upper outer periareolar right breast manifested as a 9 x 7 mm mass.   09/24/2014 Procedure   Genetic testing: BRCA plus and BreastNext panels ([place]) performed showing no clinically significant variants detected including ATM, BARD1, BRCA1, BRCA2, BRIP1, CDH1, CHEK2, MRE11A, MUTYH, NBN, NF1, PALB2, PTEN, RAD50, RAD51C, RAD51D, and TP53.   10/20/2014 Definitive Surgery   Right lumpectomy with SLNB ([place]): DCIS with necrosis and calcifications, grade 3, 1 mm from nearest margin (posterior), 0/2 LN with evidence of malignancy.   10/20/2014 Clinical Stage   Stage 0: Tis N0   11/25/2014 - 01/06/2015 Radiation Therapy   Adjuvant RT completed [name]): Right breast 50 Gy over 25 fractions; right breast boost 10 Gy over 5 fractions. Total dose received: 60 Gy.    04/13/2015 Survivorship   Survivorship care plan completed and copy mailed to patient as she was unable to be seen in the Survivorship clinic due to patient's schedule.   11/12/2018 Genetic Testing   Negative genetic testing on the common hereditary cancer panel.  The Hereditary Gene Panel offered by [facility] includes sequencing and/or deletion duplication testing of the following 48 genes: APC, ATM, AXIN2, BARD1, BMPR1A, BRCA1, BRCA2, BRIP1, CDH1, CDK4, CDKN2A (p14ARF), CDKN2A (p16INK4a), CHEK2, CTNNA1, DICER1, EPCAM (Deletion/duplication testing only), GREM1 (promoter region deletion/duplication testing only), KIT, MEN1, MLH1, MSH2, MSH3, MSH6, MUTYH, NBN, NF1, NHTL1, PALB2, PDGFRA, PMS2, POLD1, POLE, PTEN, RAD50, RAD51C, RAD51D, RNF43, SDHB, SDHC, SDHD, SMAD4, SMARCA4. STK11, TP53, TSC1, TSC2, and VHL.  The  following genes were evaluated for sequence changes only: SDHA and HOXB13 c.251G>A variant only. The report date is 11/12/2018.  AXIN2 c.815+fdup ([employer]) VUS  identified.  This is still a normal result and will not change medical management.   11/05/2020 Mammogram   IMPRESSION: 1. Indeterminate calcifications at the right lumpectomy site spanning 1.0 cm.   2.  Stable postsurgical changes in the left breast.   01/10/2021 Surgery   RIGHT BREAST LUMPECTOMY to remove scar tissue and calcifications by Dr Ninfa Linden   FINAL MICROSCOPIC DIAGNOSIS:   A. BREAST, RIGHT, LUMPECTOMY:  - Benign skin and underlying breast parenchyma with previous  procedure-related changes, including dystrophic calcifications  - Mild fibrocystic change  - Negative for carcinoma    Malignant neoplasm of upper-inner quadrant of left breast in female, estrogen receptor positive ([place])  10/18/2018 Initial Diagnosis   Malignant neoplasm of upper-inner quadrant of left breast in female, estrogen receptor positive ([place])   10/18/2018 Initial Biopsy   Diagnosis Breast, left, needle core biopsy, 10 o'clock, 4 cm fn - INVASIVE MAMMARY CARCINOMA - MAMMARY CARCINOMA IN SITU - SEE COMMENT   10/18/2018 Receptors her2   ER: 100% with strong staining  PR: 80% with strong staining  HER2 Negative    10/18/2018 Imaging   Diagnostic Mammogram 10/18/18  IMPRESSION 2 hypoechoic masses in the left breast at 10 o'clock, 4 cm from the nipple. The larger mass measures 5 x 6 x 5 mm. The second mass measures 3 x 5 x 5 mm. The masses are 7 mm apart and span a total dimension 1.7 cm. No axillary adenopathy on the left.   10/18/2018 Cancer Staging   Staging form: Breast, AJCC 8th Edition - Clinical stage from 10/18/2018: Stage IA (cT1b, cN0, cM0, G2, ER+, PR+, HER2-) - Signed by [name], MD on 11/07/2018   10/30/2018 Breast MRI   Breast MRI 10/30/18  IMPRESSION: Biopsy-proven malignancy within the left breast 10 o'clock position. No additional suspicious areas of enhancement identified within either breast.   11/2018 -  Anti-estrogen oral therapy   Anastrozole 22m daily starting 11/2018 (before  surgery). Held after 01/27/19 for further cancer treatment.    11/12/2018 Genetic Testing   Negative genetic testing on the common hereditary cancer panel.  The Hereditary Gene Panel offered by [facility] includes sequencing and/or deletion duplication testing of the following 48 genes: APC, ATM, AXIN2, BARD1, BMPR1A, BRCA1, BRCA2, BRIP1, CDH1, CDK4, CDKN2A (p14ARF), CDKN2A (p16INK4a), CHEK2, CTNNA1, DICER1, EPCAM (Deletion/duplication testing only), GREM1 (promoter region deletion/duplication testing only), KIT, MEN1, MLH1, MSH2, MSH3, MSH6, MUTYH, NBN, NF1, NHTL1, PALB2, PDGFRA, PMS2, POLD1, POLE, PTEN, RAD50, RAD51C, RAD51D, RNF43, SDHB, SDHC, SDHD, SMAD4, SMARCA4. STK11, TP53, TSC1, TSC2, and VHL.  The following genes were evaluated for sequence changes only: SDHA and HOXB13 c.251G>A variant only. The report date is 11/12/2018.  AXIN2 c.815+fdup ([employer]) VUS identified.  This is still a normal result and will not change medical management.   01/07/2019 Surgery   LEFT BREAST LUMPECTOMY WITH RADIOACTIVE SEED AND LEFT DEEP AXILLARY SENTINEL LYMPH NODE BIOPSY WITH BLUE DYE INJECTION by Dr IDalbert Batman6/2/20    01/07/2019 Pathology Results   Diagnosis 01/07/19 1. Breast, lumpectomy, Left w/seed - INVASIVE LOBULAR CARCINOMA, GRADE 2, 1.5 CM. SEE NOTE - CARCINOMA IS 0.4 CM FROM THE SUPERIOR MARGIN AND 0.6 CM FROM THE INFERIOR MARGIN - NO EVIDENCE OF LYMPHOVASCULAR OR PERINEURAL INVASION - BIOPSY SITE CHANGES - SEE ONCOLOGY TABLE 2. Lymph node, sentinel, biopsy, Left axillary #1 - LYMPH NODE, NEGATIVE FOR  CARCINOMA (0/1). SEE NOTE 3. Lymph node, sentinel, biopsy, Left axillary #2 - LYMPH NODE, NEGATIVE FOR CARCINOMA (0/1). SEE NOTE 4. Lymph node, sentinel, biopsy, Left - LYMPH NODE, NEGATIVE FOR CARCINOMA (0/1). SEE NOTE 5. Lymph node, sentinel, biopsy, Left - LYMPH NODE, NEGATIVE FOR CARCINOMA (0/1). SEE NOTE 6. Lymph node, sentinel, biopsy, Left axillary #3 - LYMPH NODE, NEGATIVE FOR CARCINOMA (0/1). SEE  NOTE   01/07/2019 Cancer Staging   Staging form: Breast, AJCC 8th Edition - Pathologic stage from 01/07/2019: Stage IA (pT1c, pN0, cM0, G2, ER+, PR+, HER2-, Oncotype DX score: 33) - Signed by [name], MD on 01/27/2019   01/21/2019 Oncotype testing   RS: 33, 21% distant recurrence risk at 9 years with tamoxifen alone; indicates >15% chemotherapy benefit    02/19/2019 - 05/14/2019 Adjuvant Chemotherapy   Adjuvant TC every 3 weeks for 4 cycles starting 02/19/19. She had allergic reation to Taxol with C2 during infusion. She was able to complete Cytoxan that cycle but not much Taxol. I switched taxol to Abraxane with C3. Completed chemo on 05/14/19.    06/17/2019 - 08/06/2019 Radiation Therapy   Adjuvant Radiation with Dr Sondra Come 06/17/19-08/06/19    08/2019 -  Anti-estrogen oral therapy   Started adjuvant Exemestane 08/2019    10/20/2019 Survivorship   SCP delivered by [name], NP       CURRENT THERAPY:   Antiestrogen therapy, starting 08/2019 (2 months neoadjuvantly, currently on anastrozole since 07/2021) Zometa q6 months x4 doses (osteopenia/osteoporosis)  INTERVAL HISTORY Ms. Mangold returns for follow up as scheduled. Last seen by Dr. Burr Medico 02/16/22.   ROS   Past Medical History:  Diagnosis Date   Allergy    Breast cancer ([place]) 09/11/14   right breast   Breast cancer in female [facility]) 01/2019   Breast cancer of upper-inner quadrant of right female breast ([place]) 09/15/2014   Family history of breast cancer    Family history of colon cancer    Family history of pancreatic cancer    Hot flashes    Hyperlipidemia    Hyperthyroidism    Personal history of chemotherapy 2020   left breast   Personal history of radiation therapy 2016   Personal history of radiation therapy 2020   Radiation 11/25/14-01/06/15   Right Breast     Past Surgical History:  Procedure Laterality Date   AUGMENTATION MAMMAPLASTY Bilateral 03/10/1997   BREAST BIOPSY Right 09/11/2014   BREAST BIOPSY Left 10/21/2018    BREAST LUMPECTOMY Right 10/20/2014   BREAST LUMPECTOMY Left 01/11/2019   BREAST LUMPECTOMY Right 01/10/2021   Procedure: RIGHT BREAST LUMPECTOMY;  Surgeon: Coralie Keens, MD;  Location: [place];  Service: General;  Laterality: Right;   BREAST LUMPECTOMY WITH RADIOACTIVE SEED AND SENTINEL LYMPH NODE BIOPSY N/A 01/07/2019   Procedure: LEFT BREAST LUMPECTOMY WITH RADIOACTIVE SEED AND LEFT DEEP AXILLARY SENTINEL LYMPH NODE BIOPSY WITH BLUE DYE INJECTION;  Surgeon: Fanny Skates, MD;  Location: [place];  Service: General;  Laterality: N/A;   COLONOSCOPY     DILATION AND CURETTAGE OF UTERUS     KNEE ARTHROSCOPY     PLACEMENT OF BREAST IMPLANTS  1995   bilat breast implants   TONSILLECTOMY     TUBAL LIGATION     WISDOM TOOTH EXTRACTION       Outpatient Encounter Medications as of 09/19/2022  Medication Sig   anastrozole (ARIMIDEX) 1 MG tablet TAKE 1 TABLET (1 MG TOTAL) BY MOUTH DAILY.   Ascorbic Acid (VITAMIN C) 1000 MG  tablet Take 2,000 mg by mouth daily.    Calcium Carb-Cholecalciferol (CALCIUM 600 + D PO) Take 2 tablets by mouth daily.    cholecalciferol (VITAMIN D3) 25 MCG (1000 UT) tablet Take 1,000 Units by mouth daily.    Melatonin 10 MG CAPS Take 20 mg by mouth at bedtime as needed (sleep).    methimazole (TAPAZOLE) 5 MG tablet Take 5 mg by mouth daily.   Multiple Vitamin (MULTIVITAMIN WITH MINERALS) TABS tablet Take 1 tablet by mouth daily.    naproxen (NAPROSYN) 375 MG tablet Take 1 tablet (375 mg total) by mouth 2 (two) times daily.   Naproxen Sod-diphenhydrAMINE 220-25 MG TABS Take 2 tablets by mouth at bedtime as needed (sleep).   oxyCODONE (ROXICODONE) 5 MG immediate release tablet Take 1 tablet (5 mg total) by mouth every 6 (six) hours as needed for severe pain or breakthrough pain.   sertraline (ZOLOFT) 100 MG tablet Take 200 mg by mouth daily.    simvastatin (ZOCOR) 40 MG tablet Take 40 mg by mouth every evening.   vitamin E 100 UNIT capsule Take 200 Units  by mouth daily.    No facility-administered encounter medications on file as of 09/19/2022.     There were no vitals filed for this visit. There is no height or weight on file to calculate BMI.   PHYSICAL EXAM GENERAL:alert, no distress and comfortable SKIN: no rash  EYES: sclera clear NECK: without mass LYMPH:  no palpable cervical or supraclavicular lymphadenopathy  LUNGS: clear with normal breathing effort HEART: regular rate & rhythm, no lower extremity edema ABDOMEN: abdomen soft, non-tender and normal bowel sounds NEURO: alert & oriented x 3 with fluent speech, no focal motor/sensory deficits Breast exam:  PAC without erythema    CBC    Component Value Date/Time   WBC 6.2 02/16/2022 0956   WBC 40.3 (H) 04/12/2019 1516   RBC 4.68 02/16/2022 0956   HGB 13.9 02/16/2022 0956   HGB 13.9 09/23/2014 0814   HCT 41.8 02/16/2022 0956   HCT 43.2 09/23/2014 0814   PLT 207 02/16/2022 0956   PLT 270 09/23/2014 0814   MCV 89.3 02/16/2022 0956   MCV 88.0 09/23/2014 0814   MCH 29.7 02/16/2022 0956   MCHC 33.3 02/16/2022 0956   RDW 12.1 02/16/2022 0956   RDW 13.4 09/23/2014 0814   LYMPHSABS 1.7 02/16/2022 0956   LYMPHSABS 2.7 09/23/2014 0814   MONOABS 0.4 02/16/2022 0956   MONOABS 0.5 09/23/2014 0814   EOSABS 0.0 02/16/2022 0956   EOSABS 0.0 09/23/2014 0814   BASOSABS 0.0 02/16/2022 0956   BASOSABS 0.0 09/23/2014 0814     CMP     Component Value Date/Time   NA 143 02/16/2022 0956   NA 145 09/23/2014 0815   K 4.6 02/16/2022 0956   K 4.5 09/23/2014 0815   CL 109 02/16/2022 0956   CO2 30 02/16/2022 0956   CO2 27 09/23/2014 0815   GLUCOSE 101 (H) 02/16/2022 0956   GLUCOSE 95 09/23/2014 0815   BUN 19 02/16/2022 0956   BUN 22.2 09/23/2014 0815   CREATININE 0.73 02/16/2022 0956   CREATININE 0.8 09/23/2014 0815   CALCIUM 9.6 02/16/2022 0956   CALCIUM 9.5 09/23/2014 0815   PROT 6.9 02/16/2022 0956   PROT 6.6 09/23/2014 0815   ALBUMIN 4.2 02/16/2022 0956   ALBUMIN 3.9  09/23/2014 0815   AST 20 02/16/2022 0956   AST 19 09/23/2014 0815   ALT 21 02/16/2022 0956   ALT 20 09/23/2014 0815  ALKPHOS 82 02/16/2022 0956   ALKPHOS 88 09/23/2014 0815   BILITOT 0.4 02/16/2022 0956   BILITOT 0.37 09/23/2014 0815   GFRNONAA >60 02/16/2022 0956   GFRAA >60 07/22/2019 1155     ASSESSMENT & PLAN:  PLAN:  No orders of the defined types were placed in this encounter.     All questions were answered. The patient knows to call the clinic with any problems, questions or concerns. No barriers to learning were detected. I spent *** counseling the patient face to face. The total time spent in the appointment was *** and more than 50% was on counseling, review of test results, and coordination of care.   [name], NP-C @DATE$ @

## 2022-09-19 ENCOUNTER — Inpatient Hospital Stay: Payer: Medicare HMO

## 2022-09-19 ENCOUNTER — Inpatient Hospital Stay (HOSPITAL_BASED_OUTPATIENT_CLINIC_OR_DEPARTMENT_OTHER): Payer: Medicare HMO | Admitting: Nurse Practitioner

## 2022-09-19 ENCOUNTER — Encounter: Payer: Self-pay | Admitting: Nurse Practitioner

## 2022-09-19 ENCOUNTER — Inpatient Hospital Stay: Payer: Medicare HMO | Attending: Hematology

## 2022-09-19 VITALS — BP 122/82 | HR 53 | Temp 98.5°F | Resp 18 | Ht 60.0 in | Wt 143.1 lb

## 2022-09-19 VITALS — BP 153/74 | HR 53 | Temp 98.4°F | Resp 18

## 2022-09-19 DIAGNOSIS — Z79811 Long term (current) use of aromatase inhibitors: Secondary | ICD-10-CM | POA: Insufficient documentation

## 2022-09-19 DIAGNOSIS — Z171 Estrogen receptor negative status [ER-]: Secondary | ICD-10-CM | POA: Diagnosis not present

## 2022-09-19 DIAGNOSIS — C50212 Malignant neoplasm of upper-inner quadrant of left female breast: Secondary | ICD-10-CM | POA: Insufficient documentation

## 2022-09-19 DIAGNOSIS — M8589 Other specified disorders of bone density and structure, multiple sites: Secondary | ICD-10-CM | POA: Insufficient documentation

## 2022-09-19 DIAGNOSIS — Z170 Estrogen receptor positive status [ER+]: Secondary | ICD-10-CM | POA: Diagnosis not present

## 2022-09-19 DIAGNOSIS — C50211 Malignant neoplasm of upper-inner quadrant of right female breast: Secondary | ICD-10-CM

## 2022-09-19 LAB — CBC WITH DIFFERENTIAL (CANCER CENTER ONLY)
Abs Immature Granulocytes: 0.01 10*3/uL (ref 0.00–0.07)
Basophils Absolute: 0 10*3/uL (ref 0.0–0.1)
Basophils Relative: 0 %
Eosinophils Absolute: 0 10*3/uL (ref 0.0–0.5)
Eosinophils Relative: 1 %
HCT: 41.5 % (ref 36.0–46.0)
Hemoglobin: 14 g/dL (ref 12.0–15.0)
Immature Granulocytes: 0 %
Lymphocytes Relative: 32 %
Lymphs Abs: 1.9 10*3/uL (ref 0.7–4.0)
MCH: 30 pg (ref 26.0–34.0)
MCHC: 33.7 g/dL (ref 30.0–36.0)
MCV: 88.9 fL (ref 80.0–100.0)
Monocytes Absolute: 0.4 10*3/uL (ref 0.1–1.0)
Monocytes Relative: 6 %
Neutro Abs: 3.7 10*3/uL (ref 1.7–7.7)
Neutrophils Relative %: 61 %
Platelet Count: 204 10*3/uL (ref 150–400)
RBC: 4.67 MIL/uL (ref 3.87–5.11)
RDW: 12 % (ref 11.5–15.5)
WBC Count: 6.1 10*3/uL (ref 4.0–10.5)
nRBC: 0 % (ref 0.0–0.2)

## 2022-09-19 LAB — CMP (CANCER CENTER ONLY)
ALT: 13 U/L (ref 0–44)
AST: 14 U/L — ABNORMAL LOW (ref 15–41)
Albumin: 4.2 g/dL (ref 3.5–5.0)
Alkaline Phosphatase: 55 U/L (ref 38–126)
Anion gap: 4 — ABNORMAL LOW (ref 5–15)
BUN: 14 mg/dL (ref 8–23)
CO2: 31 mmol/L (ref 22–32)
Calcium: 9.3 mg/dL (ref 8.9–10.3)
Chloride: 109 mmol/L (ref 98–111)
Creatinine: 0.68 mg/dL (ref 0.44–1.00)
GFR, Estimated: >60 mL/min (ref >60)
Glucose, Bld: 120 mg/dL — ABNORMAL HIGH (ref 70–99)
Potassium: 4.5 mmol/L (ref 3.5–5.1)
Sodium: 144 mmol/L (ref 135–145)
Total Bilirubin: 0.4 mg/dL (ref 0.3–1.2)
Total Protein: 6.6 g/dL (ref 6.5–8.1)

## 2022-09-19 MED ORDER — ZOLEDRONIC ACID 4 MG/100ML IV SOLN
4.0000 mg | Freq: Once | INTRAVENOUS | Status: AC
  Administered 2022-09-19: 4 mg via INTRAVENOUS
  Filled 2022-09-19: qty 100

## 2022-09-19 MED ORDER — SODIUM CHLORIDE 0.9 % IV SOLN: Freq: Once | INTRAVENOUS | Status: AC

## 2022-09-19 NOTE — Patient Instructions (Signed)

## 2022-11-24 DIAGNOSIS — L538 Other specified erythematous conditions: Secondary | ICD-10-CM | POA: Diagnosis not present

## 2022-11-24 DIAGNOSIS — L821 Other seborrheic keratosis: Secondary | ICD-10-CM | POA: Diagnosis not present

## 2022-11-24 DIAGNOSIS — L814 Other melanin hyperpigmentation: Secondary | ICD-10-CM | POA: Diagnosis not present

## 2022-11-24 DIAGNOSIS — D225 Melanocytic nevi of trunk: Secondary | ICD-10-CM | POA: Diagnosis not present

## 2022-11-24 DIAGNOSIS — L820 Inflamed seborrheic keratosis: Secondary | ICD-10-CM | POA: Diagnosis not present

## 2022-11-24 DIAGNOSIS — D485 Neoplasm of uncertain behavior of skin: Secondary | ICD-10-CM | POA: Diagnosis not present

## 2022-11-24 DIAGNOSIS — L298 Other pruritus: Secondary | ICD-10-CM | POA: Diagnosis not present

## 2022-11-27 ENCOUNTER — Other Ambulatory Visit: Payer: Self-pay | Admitting: Nurse Practitioner

## 2022-11-27 ENCOUNTER — Ambulatory Visit
Admission: RE | Admit: 2022-11-27 | Discharge: 2022-11-27 | Disposition: A | Discharge status: Home / Self Care | Payer: Medicare HMO | Source: Ambulatory Visit | Attending: Nurse Practitioner | Admitting: Nurse Practitioner

## 2022-11-27 DIAGNOSIS — Z1231 Encounter for screening mammogram for malignant neoplasm of breast: Secondary | ICD-10-CM | POA: Diagnosis not present

## 2022-11-27 DIAGNOSIS — Z171 Estrogen receptor negative status [ER-]: Secondary | ICD-10-CM

## 2022-11-27 DIAGNOSIS — C50212 Malignant neoplasm of upper-inner quadrant of left female breast: Secondary | ICD-10-CM

## 2023-01-31 ENCOUNTER — Encounter: Payer: Self-pay | Admitting: Internal Medicine

## 2023-02-23 DIAGNOSIS — L821 Other seborrheic keratosis: Secondary | ICD-10-CM | POA: Diagnosis not present

## 2023-02-23 DIAGNOSIS — Z872 Personal history of diseases of the skin and subcutaneous tissue: Secondary | ICD-10-CM | POA: Diagnosis not present

## 2023-02-23 DIAGNOSIS — L570 Actinic keratosis: Secondary | ICD-10-CM | POA: Diagnosis not present

## 2023-02-23 DIAGNOSIS — L814 Other melanin hyperpigmentation: Secondary | ICD-10-CM | POA: Diagnosis not present

## 2023-02-23 DIAGNOSIS — Z09 Encounter for follow-up examination after completed treatment for conditions other than malignant neoplasm: Secondary | ICD-10-CM | POA: Diagnosis not present

## 2023-02-28 ENCOUNTER — Encounter: Payer: Self-pay | Admitting: Internal Medicine

## 2023-03-12 ENCOUNTER — Other Ambulatory Visit: Payer: Self-pay | Admitting: Hematology

## 2023-03-18 NOTE — Progress Notes (Unsigned)
Patient Care Team: Irena Reichmann, DO as PCP - General (Family Medicine) Harriette Bouillon, MD as Consulting Physician (General Surgery) Malachy Mood, MD as Consulting Physician (Hematology) Lurline Hare, MD as Consulting Physician (Radiation Oncology) Donnelly Angelica, RN as Registered Nurse Pershing Proud, RN as Registered Nurse Hubbard Hartshorn, NP (Inactive) as Nurse Practitioner (Nurse Practitioner) Salomon Fick, NP as Nurse Practitioner (Nurse Practitioner) Marlene Lard, MD as Consulting Physician (Endocrinology) Pollyann Samples, NP as Nurse Practitioner (Nurse Practitioner)   CHIEF COMPLAINT: Follow up bilateral breast cancer   Oncology History Overview Note  Cancer Staging Breast cancer of upper-inner quadrant of right female breast [facility]) Staging form: Breast, AJCC 7th Edition - Clinical stage from 09/23/2014: Stage 0 (Tis (DCIS), N0, M0) - Unsigned - Pathologic stage from 10/22/2014: Stage 0 (Tis (DCIS), N0, cM0) - Signed by Pecola Leisure, MD on 11/03/2014 Staging comments: Staged on final lumpectomy specimen by Dr. Frederica Kuster  Malignant neoplasm of upper-inner quadrant of left breast in female, estrogen receptor positive (HCC) Staging form: Breast, AJCC 8th Edition - Clinical: cT1b, cN0, cM0, G2, ER+, PR+ - Unsigned    Breast cancer of upper-inner quadrant of right female breast (HCC)  09/11/2014 Mammogram   Right breast: area of pleomorphic calcifications and associated architectural distortion, with irregular density at the 1 o'clock position of the right breast. No other suspicious findings in the right breast.    09/11/2014 Breast US   Right breast: area of hypoechogenecity 2 cm x 0.9 cm x 1.6 cm in size, 12 o'clock position, 3 cm the nipple. No discrete suspicious mass.   09/11/2014 Initial Biopsy   Right breast needle core bx (11-12 o'clock, UOQ): DCIS, grade 3, with necrosis and calcifications, ER- (0%), PR- (0%)   09/11/2014 Pathologic Stage   Stage 0:  Tis Nx    09/17/2014 Breast MRI   Non mass enhancement in the upper and slight outer right breast measuring approximately 2.6 x 1.7 x 2.2 cm. Biopsy proven fibrocystic changes in the upper outer periareolar right breast manifested as a 9 x 7 mm mass.   09/24/2014 Procedure   Genetic testing: BRCA plus and BreastNext panels ([place]) performed showing no clinically significant variants detected including ATM, BARD1, BRCA1, BRCA2, BRIP1, CDH1, CHEK2, MRE11A, MUTYH, NBN, NF1, PALB2, PTEN, RAD50, RAD51C, RAD51D, and TP53.   10/20/2014 Definitive Surgery   Right lumpectomy with SLNB ([place]): DCIS with necrosis and calcifications, grade 3, 1 mm from nearest margin (posterior), 0/2 LN with evidence of malignancy.   10/20/2014 Clinical Stage   Stage 0: Tis N0   11/25/2014 - 01/06/2015 Radiation Therapy   Adjuvant RT completed Michell Heinrich): Right breast 50 Gy over 25 fractions; right breast boost 10 Gy over 5 fractions. Total dose received: 60 Gy.    04/13/2015 Survivorship   Survivorship care plan completed and copy mailed to patient as she was unable to be seen in the Survivorship clinic due to patient's schedule.   11/12/2018 Genetic Testing   Negative genetic testing on the common hereditary cancer panel.  The Hereditary Gene Panel offered by [facility] includes sequencing and/or deletion duplication testing of the following 48 genes: APC, ATM, AXIN2, BARD1, BMPR1A, BRCA1, BRCA2, BRIP1, CDH1, CDK4, CDKN2A (p14ARF), CDKN2A (p16INK4a), CHEK2, CTNNA1, DICER1, EPCAM (Deletion/duplication testing only), GREM1 (promoter region deletion/duplication testing only), KIT, MEN1, MLH1, MSH2, MSH3, MSH6, MUTYH, NBN, NF1, NHTL1, PALB2, PDGFRA, PMS2, POLD1, POLE, PTEN, RAD50, RAD51C, RAD51D, RNF43, SDHB, SDHC, SDHD, SMAD4, SMARCA4. STK11, TP53, TSC1, TSC2, and VHL.  The  following genes were evaluated for sequence changes only: SDHA and HOXB13 c.251G>A variant only. The report date is 11/12/2018.  AXIN2 c.815+fdup ([employer]) VUS  identified.  This is still a normal result and will not change medical management.   11/05/2020 Mammogram   IMPRESSION: 1. Indeterminate calcifications at the right lumpectomy site spanning 1.0 cm.   2.  Stable postsurgical changes in the left breast.   01/10/2021 Surgery   RIGHT BREAST LUMPECTOMY to remove scar tissue and calcifications by Dr Magnus Ivan   FINAL MICROSCOPIC DIAGNOSIS:   A. BREAST, RIGHT, LUMPECTOMY:  - Benign skin and underlying breast parenchyma with previous  procedure-related changes, including dystrophic calcifications  - Mild fibrocystic change  - Negative for carcinoma    Malignant neoplasm of upper-inner quadrant of left breast in female, estrogen receptor positive (HCC)  10/18/2018 Initial Diagnosis   Malignant neoplasm of upper-inner quadrant of left breast in female, estrogen receptor positive (HCC)   10/18/2018 Initial Biopsy   Diagnosis Breast, left, needle core biopsy, 10 o'clock, 4 cm fn - INVASIVE MAMMARY CARCINOMA - MAMMARY CARCINOMA IN SITU - SEE COMMENT   10/18/2018 Receptors her2   ER: 100% with strong staining  PR: 80% with strong staining  HER2 Negative    10/18/2018 Imaging   Diagnostic Mammogram 10/18/18  IMPRESSION 2 hypoechoic masses in the left breast at 10 o'clock, 4 cm from the nipple. The larger mass measures 5 x 6 x 5 mm. The second mass measures 3 x 5 x 5 mm. The masses are 7 mm apart and span a total dimension 1.7 cm. No axillary adenopathy on the left.   10/18/2018 Cancer Staging   Staging form: Breast, AJCC 8th Edition - Clinical stage from 10/18/2018: Stage IA (cT1b, cN0, cM0, G2, ER+, PR+, HER2-) - Signed by Malachy Mood, MD on 11/07/2018   10/30/2018 Breast MRI   Breast MRI 10/30/18  IMPRESSION: Biopsy-proven malignancy within the left breast 10 o'clock position. No additional suspicious areas of enhancement identified within either breast.   11/2018 -  Anti-estrogen oral therapy   Anastrozole 1mg  daily starting 11/2018 (before  surgery). Held after 01/27/19 for further cancer treatment.    11/12/2018 Genetic Testing   Negative genetic testing on the common hereditary cancer panel.  The Hereditary Gene Panel offered by [facility] includes sequencing and/or deletion duplication testing of the following 48 genes: APC, ATM, AXIN2, BARD1, BMPR1A, BRCA1, BRCA2, BRIP1, CDH1, CDK4, CDKN2A (p14ARF), CDKN2A (p16INK4a), CHEK2, CTNNA1, DICER1, EPCAM (Deletion/duplication testing only), GREM1 (promoter region deletion/duplication testing only), KIT, MEN1, MLH1, MSH2, MSH3, MSH6, MUTYH, NBN, NF1, NHTL1, PALB2, PDGFRA, PMS2, POLD1, POLE, PTEN, RAD50, RAD51C, RAD51D, RNF43, SDHB, SDHC, SDHD, SMAD4, SMARCA4. STK11, TP53, TSC1, TSC2, and VHL.  The following genes were evaluated for sequence changes only: SDHA and HOXB13 c.251G>A variant only. The report date is 11/12/2018.  AXIN2 c.815+fdup ([employer]) VUS identified.  This is still a normal result and will not change medical management.   01/07/2019 Surgery   LEFT BREAST LUMPECTOMY WITH RADIOACTIVE SEED AND LEFT DEEP AXILLARY SENTINEL LYMPH NODE BIOPSY WITH BLUE DYE INJECTION by Dr Derrell Lolling 01/07/19    01/07/2019 Pathology Results   Diagnosis 01/07/19 1. Breast, lumpectomy, Left w/seed - INVASIVE LOBULAR CARCINOMA, GRADE 2, 1.5 CM. SEE NOTE - CARCINOMA IS 0.4 CM FROM THE SUPERIOR MARGIN AND 0.6 CM FROM THE INFERIOR MARGIN - NO EVIDENCE OF LYMPHOVASCULAR OR PERINEURAL INVASION - BIOPSY SITE CHANGES - SEE ONCOLOGY TABLE 2. Lymph node, sentinel, biopsy, Left axillary #1 - LYMPH NODE, NEGATIVE FOR  CARCINOMA (0/1). SEE NOTE 3. Lymph node, sentinel, biopsy, Left axillary #2 - LYMPH NODE, NEGATIVE FOR CARCINOMA (0/1). SEE NOTE 4. Lymph node, sentinel, biopsy, Left - LYMPH NODE, NEGATIVE FOR CARCINOMA (0/1). SEE NOTE 5. Lymph node, sentinel, biopsy, Left - LYMPH NODE, NEGATIVE FOR CARCINOMA (0/1). SEE NOTE 6. Lymph node, sentinel, biopsy, Left axillary #3 - LYMPH NODE, NEGATIVE FOR CARCINOMA (0/1). SEE  NOTE   01/07/2019 Cancer Staging   Staging form: Breast, AJCC 8th Edition - Pathologic stage from 01/07/2019: Stage IA (pT1c, pN0, cM0, G2, ER+, PR+, HER2-, Oncotype DX score: 33) - Signed by Malachy Mood, MD on 01/27/2019   01/21/2019 Oncotype testing   RS: 33, 21% distant recurrence risk at 9 years with tamoxifen alone; indicates >15% chemotherapy benefit    02/19/2019 - 05/14/2019 Adjuvant Chemotherapy   Adjuvant TC every 3 weeks for 4 cycles starting 02/19/19. She had allergic reation to Taxol with C2 during infusion. She was able to complete Cytoxan that cycle but not much Taxol. I switched taxol to Abraxane with C3. Completed chemo on 05/14/19.    06/17/2019 - 08/06/2019 Radiation Therapy   Adjuvant Radiation with Dr Roselind Messier 06/17/19-08/06/19    08/2019 -  Anti-estrogen oral therapy   Started adjuvant Exemestane 08/2019    10/20/2019 Survivorship   SCP delivered by Santiago Glad, NP       CURRENT THERAPY:  Antiestrogen therapy, starting 08/2019 (2 months neoadjuvantly, currently on anastrozole since 07/2021) Zometa q6 months x4 doses (osteopenia/osteoporosis)  INTERVAL HISTORY Ms. Bauknecht returns for follow up as scheduled. Last seen by me 09/19/22 with second dose zometa. Mammogram 11/2022 was benign.   ROS   Past Medical History:  Diagnosis Date   Allergy    Breast cancer (HCC) 09/11/14   right breast   Breast cancer in female [facility]) 01/2019   Breast cancer of upper-inner quadrant of right female breast (HCC) 09/15/2014   Family history of breast cancer    Family history of colon cancer    Family history of pancreatic cancer    Hot flashes    Hyperlipidemia    Hyperthyroidism    Personal history of chemotherapy 2020   left breast   Personal history of radiation therapy 2016   Personal history of radiation therapy 2020   Radiation 11/25/14-01/06/15   Right Breast     Past Surgical History:  Procedure Laterality Date   AUGMENTATION MAMMAPLASTY Bilateral 03/10/1997   BREAST BIOPSY Right  09/11/2014   BREAST BIOPSY Left 10/21/2018   BREAST LUMPECTOMY Right 10/20/2014   BREAST LUMPECTOMY Left 01/11/2019   BREAST LUMPECTOMY Right 01/10/2021   Procedure: RIGHT BREAST LUMPECTOMY;  Surgeon: Abigail Miyamoto, MD;  Location: [facility];  Service: General;  Laterality: Right;   BREAST LUMPECTOMY WITH RADIOACTIVE SEED AND SENTINEL LYMPH NODE BIOPSY N/A 01/07/2019   Procedure: LEFT BREAST LUMPECTOMY WITH RADIOACTIVE SEED AND LEFT DEEP AXILLARY SENTINEL LYMPH NODE BIOPSY WITH BLUE DYE INJECTION;  Surgeon: Claud Kelp, MD;  Location: [facility] OR;  Service: General;  Laterality: N/A;   COLONOSCOPY     DILATION AND CURETTAGE OF UTERUS     KNEE ARTHROSCOPY     PLACEMENT OF BREAST IMPLANTS  1995   bilat breast implants   TONSILLECTOMY     TUBAL LIGATION     WISDOM TOOTH EXTRACTION       Outpatient Encounter Medications as of 03/20/2023  Medication Sig   anastrozole (ARIMIDEX) 1 MG tablet TAKE 1 TABLET BY MOUTH DAILY.   Ascorbic Acid (VITAMIN  C) 1000 MG tablet Take 2,000 mg by mouth daily.    Biotin 1 MG CAPS Take 1 1e11 Vector [employer] by mouth daily.   Calcium Carb-Cholecalciferol (CALCIUM 600 + D PO) Take 2 tablets by mouth daily.    cholecalciferol (VITAMIN D3) 25 MCG (1000 UT) tablet Take 1,000 Units by mouth daily.    Melatonin 10 MG CAPS Take 20 mg by mouth at bedtime as needed (sleep).    Multiple Vitamin (MULTIVITAMIN WITH MINERALS) TABS tablet Take 1 tablet by mouth daily.    oxyCODONE (ROXICODONE) 5 MG immediate release tablet Take 1 tablet (5 mg total) by mouth every 6 (six) hours as needed for severe pain or breakthrough pain. (Patient not taking: Reported on 09/19/2022)   sertraline (ZOLOFT) 100 MG tablet Take 200 mg by mouth daily.    simvastatin (ZOCOR) 40 MG tablet Take 40 mg by mouth every evening.   vitamin E 100 UNIT capsule Take 200 Units by mouth daily.    No facility-administered encounter medications on file as of 03/20/2023.     There were no vitals  filed for this visit. There is no height or weight on file to calculate BMI.   PHYSICAL EXAM GENERAL:alert, no distress and comfortable SKIN: no rash  EYES: sclera clear NECK: without mass LYMPH:  no palpable cervical or supraclavicular lymphadenopathy  LUNGS: clear with normal breathing effort HEART: regular rate & rhythm, no lower extremity edema ABDOMEN: abdomen soft, non-tender and normal bowel sounds NEURO: alert & oriented x 3 with fluent speech, no focal motor/sensory deficits Breast exam:  PAC without erythema    CBC    Component Value Date/Time   WBC 6.1 09/19/2022 1000   WBC 40.3 (H) 04/12/2019 1516   RBC 4.67 09/19/2022 1000   HGB 14.0 09/19/2022 1000   HGB 13.9 09/23/2014 0814   HCT 41.5 09/19/2022 1000   HCT 43.2 09/23/2014 0814   PLT 204 09/19/2022 1000   PLT 270 09/23/2014 0814   MCV 88.9 09/19/2022 1000   MCV 88.0 09/23/2014 0814   MCH 30.0 09/19/2022 1000   MCHC 33.7 09/19/2022 1000   RDW 12.0 09/19/2022 1000   RDW 13.4 09/23/2014 0814   LYMPHSABS 1.9 09/19/2022 1000   LYMPHSABS 2.7 09/23/2014 0814   MONOABS 0.4 09/19/2022 1000   MONOABS 0.5 09/23/2014 0814   EOSABS 0.0 09/19/2022 1000   EOSABS 0.0 09/23/2014 0814   BASOSABS 0.0 09/19/2022 1000   BASOSABS 0.0 09/23/2014 0814     CMP     Component Value Date/Time   NA 144 09/19/2022 1000   NA 145 09/23/2014 0815   K 4.5 09/19/2022 1000   K 4.5 09/23/2014 0815   CL 109 09/19/2022 1000   CO2 31 09/19/2022 1000   CO2 27 09/23/2014 0815   GLUCOSE 120 (H) 09/19/2022 1000   GLUCOSE 95 09/23/2014 0815   BUN 14 09/19/2022 1000   BUN 22.2 09/23/2014 0815   CREATININE 0.68 09/19/2022 1000   CREATININE 0.8 09/23/2014 0815   CALCIUM 9.3 09/19/2022 1000   CALCIUM 9.5 09/23/2014 0815   PROT 6.6 09/19/2022 1000   PROT 6.6 09/23/2014 0815   ALBUMIN 4.2 09/19/2022 1000   ALBUMIN 3.9 09/23/2014 0815   AST 14 (L) 09/19/2022 1000   AST 19 09/23/2014 0815   ALT 13 09/19/2022 1000   ALT 20 09/23/2014  0815   ALKPHOS 55 09/19/2022 1000   ALKPHOS 88 09/23/2014 0815   BILITOT 0.4 09/19/2022 1000   BILITOT 0.37 09/23/2014 0815  GFRNONAA >60 09/19/2022 1000   GFRAA >60 07/22/2019 1155     ASSESSMENT & PLAN:Rickayla C Littell is a 72 y.o. female with      1. Malignant neoplasm of upper-inner quadrant of left breast, invasive lobular carcinoma, Stage IA, pT1c,N0,M0, ER+/PR+, HER2-, Grade II, RS 33 -Diagnosed in 10/2018. She started neoadjuvant anastrozole in 11/2018 due to the delay of her surgery due to COVID pandemic. This was held after her breast surgery.  -She underwent left breast lumpectomy on 01/07/19. Given her RS 78 and high risk disease, she completed 4 cycles of adjvuant chemo TC and adjuvant Radiation.  -She started antiestrogen therapy 08/2019 (2 months neoadjuvant), on Anastrozole since 07/2021 -Mammogram in 11/27/22 was negative   2. 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 She is s/p right lumpectomy and SLNB and adjuvant radiation.  -Given her negative ER/PR status, there is no benefit of antiestrogen therapy.  -Continue surveillance   3. Genetic BRCA plus panel was negative for pathogenetic mutations   4. Bone health  -DEXA in 03/2019 showed osteopenia in the hips -Repeat DEXA showed worsening osteopenia, T-score -2.4 with high FRAX score.  She began Zometa q6 months x4 doses, in 03/2022 -s/p 2nd dose 09/2022     PLAN:  No orders of the defined types were placed in this encounter.     All questions were answered. The patient knows to call the clinic with any problems, questions or concerns. No barriers to learning were detected. I spent *** counseling the patient face to face. The total time spent in the appointment was *** and more than 50% was on counseling, review of test results, and coordination of care.   Santiago Glad, NP-C @DATE @

## 2023-03-20 ENCOUNTER — Inpatient Hospital Stay: Payer: Medicare HMO

## 2023-03-20 ENCOUNTER — Inpatient Hospital Stay: Payer: Medicare HMO | Attending: Nurse Practitioner

## 2023-03-20 ENCOUNTER — Inpatient Hospital Stay: Payer: Medicare HMO | Admitting: Nurse Practitioner

## 2023-03-20 ENCOUNTER — Encounter: Payer: Self-pay | Admitting: Nurse Practitioner

## 2023-03-20 ENCOUNTER — Other Ambulatory Visit: Payer: Self-pay

## 2023-03-20 VITALS — BP 126/73 | HR 60 | Temp 97.8°F | Resp 16 | Wt 143.7 lb

## 2023-03-20 DIAGNOSIS — M8589 Other specified disorders of bone density and structure, multiple sites: Secondary | ICD-10-CM | POA: Insufficient documentation

## 2023-03-20 DIAGNOSIS — Z171 Estrogen receptor negative status [ER-]: Secondary | ICD-10-CM

## 2023-03-20 DIAGNOSIS — Z170 Estrogen receptor positive status [ER+]: Secondary | ICD-10-CM

## 2023-03-20 DIAGNOSIS — C50211 Malignant neoplasm of upper-inner quadrant of right female breast: Secondary | ICD-10-CM | POA: Diagnosis not present

## 2023-03-20 DIAGNOSIS — C50212 Malignant neoplasm of upper-inner quadrant of left female breast: Secondary | ICD-10-CM

## 2023-03-20 DIAGNOSIS — Z79811 Long term (current) use of aromatase inhibitors: Secondary | ICD-10-CM | POA: Diagnosis not present

## 2023-03-20 LAB — CBC WITH DIFFERENTIAL (CANCER CENTER ONLY)
Abs Immature Granulocytes: 0.01 10*3/uL (ref 0.00–0.07)
Basophils Absolute: 0.1 10*3/uL (ref 0.0–0.1)
Basophils Relative: 1 %
Eosinophils Absolute: 0.1 10*3/uL (ref 0.0–0.5)
Eosinophils Relative: 1 %
HCT: 42.4 % (ref 36.0–46.0)
Hemoglobin: 14.2 g/dL (ref 12.0–15.0)
Immature Granulocytes: 0 %
Lymphocytes Relative: 33 %
Lymphs Abs: 1.9 10*3/uL (ref 0.7–4.0)
MCH: 29.6 pg (ref 26.0–34.0)
MCHC: 33.5 g/dL (ref 30.0–36.0)
MCV: 88.5 fL (ref 80.0–100.0)
Monocytes Absolute: 0.4 10*3/uL (ref 0.1–1.0)
Monocytes Relative: 7 %
Neutro Abs: 3.3 10*3/uL (ref 1.7–7.7)
Neutrophils Relative %: 58 %
Platelet Count: 197 10*3/uL (ref 150–400)
RBC: 4.79 MIL/uL (ref 3.87–5.11)
RDW: 11.9 % (ref 11.5–15.5)
WBC Count: 5.7 10*3/uL (ref 4.0–10.5)
nRBC: 0 % (ref 0.0–0.2)

## 2023-03-20 LAB — CMP (CANCER CENTER ONLY)
ALT: 20 U/L (ref 0–44)
AST: 18 U/L (ref 15–41)
Albumin: 4.1 g/dL (ref 3.5–5.0)
Alkaline Phosphatase: 57 U/L (ref 38–126)
Anion gap: 5 (ref 5–15)
BUN: 17 mg/dL (ref 8–23)
CO2: 29 mmol/L (ref 22–32)
Calcium: 8.9 mg/dL (ref 8.9–10.3)
Chloride: 108 mmol/L (ref 98–111)
Creatinine: 0.82 mg/dL (ref 0.44–1.00)
GFR, Estimated: >60 mL/min (ref >60)
Glucose, Bld: 108 mg/dL — ABNORMAL HIGH (ref 70–99)
Potassium: 4.1 mmol/L (ref 3.5–5.1)
Sodium: 142 mmol/L (ref 135–145)
Total Bilirubin: 0.4 mg/dL (ref 0.3–1.2)
Total Protein: 6.6 g/dL (ref 6.5–8.1)

## 2023-03-20 MED ORDER — SODIUM CHLORIDE 0.9 % IV SOLN: Freq: Once | INTRAVENOUS | Status: AC

## 2023-03-20 MED ORDER — ZOLEDRONIC ACID 4 MG/100ML IV SOLN
4.0000 mg | Freq: Once | INTRAVENOUS | Status: AC
  Administered 2023-03-20: 4 mg via INTRAVENOUS
  Filled 2023-03-20: qty 100

## 2023-03-20 NOTE — Progress Notes (Signed)
Per Santiago Glad NP, ok to give Zometa today with Corrected Calcium 8.82.

## 2023-03-20 NOTE — Patient Instructions (Signed)

## 2023-03-22 ENCOUNTER — Encounter: Payer: Self-pay | Admitting: Hematology

## 2023-03-28 ENCOUNTER — Encounter: Payer: Self-pay | Admitting: Internal Medicine

## 2023-03-28 ENCOUNTER — Ambulatory Visit (AMBULATORY_SURGERY_CENTER): Payer: Medicare HMO

## 2023-03-28 VITALS — Ht 60.0 in | Wt 140.0 lb

## 2023-03-28 DIAGNOSIS — Z86010 Personal history of colonic polyps: Secondary | ICD-10-CM

## 2023-03-28 MED ORDER — NA SULFATE-K SULFATE-MG SULF 17.5-3.13-1.6 GM/177ML PO SOLN
1.0000 | Freq: Once | ORAL | 0 refills | Status: AC
Start: 2023-03-28 — End: 2023-03-28

## 2023-03-28 NOTE — Progress Notes (Signed)

## 2023-04-06 DIAGNOSIS — R062 Wheezing: Secondary | ICD-10-CM | POA: Diagnosis not present

## 2023-04-06 DIAGNOSIS — R059 Cough, unspecified: Secondary | ICD-10-CM | POA: Diagnosis not present

## 2023-04-19 ENCOUNTER — Encounter: Payer: Medicare HMO | Admitting: Internal Medicine

## 2023-05-02 ENCOUNTER — Encounter: Payer: Self-pay | Admitting: Internal Medicine

## 2023-05-02 ENCOUNTER — Ambulatory Visit (AMBULATORY_SURGERY_CENTER): Payer: Medicare HMO | Admitting: Internal Medicine

## 2023-05-02 VITALS — BP 110/55 | HR 80 | Temp 97.5°F | Resp 15 | Ht 60.0 in | Wt 140.0 lb

## 2023-05-02 DIAGNOSIS — D122 Benign neoplasm of ascending colon: Secondary | ICD-10-CM

## 2023-05-02 DIAGNOSIS — Z09 Encounter for follow-up examination after completed treatment for conditions other than malignant neoplasm: Secondary | ICD-10-CM | POA: Diagnosis not present

## 2023-05-02 DIAGNOSIS — Z86010 Personal history of colonic polyps: Secondary | ICD-10-CM

## 2023-05-02 DIAGNOSIS — E785 Hyperlipidemia, unspecified: Secondary | ICD-10-CM | POA: Diagnosis not present

## 2023-05-02 DIAGNOSIS — Z1211 Encounter for screening for malignant neoplasm of colon: Secondary | ICD-10-CM | POA: Diagnosis not present

## 2023-05-02 MED ORDER — SODIUM CHLORIDE 0.9 % IV SOLN: 500.0000 mL | INTRAVENOUS | Status: DC

## 2023-05-02 NOTE — Patient Instructions (Signed)
Discharge instructions given. Handouts on polyps and Hemorrhoids. Resume previous medications. YOU HAD AN ENDOSCOPIC PROCEDURE TODAY AT THE [facility]:   Refer to the procedure report that was given to you for any specific questions about what was found during the examination.  If the procedure report does not answer your questions, please call your gastroenterologist to clarify.  If you requested that your care partner not be given the details of your procedure findings, then the procedure report has been included in a sealed envelope for you to review at your convenience later.  YOU SHOULD EXPECT: Some feelings of bloating in the abdomen. Passage of more gas than usual.  Walking can help get rid of the air that was put into your GI tract during the procedure and reduce the bloating. If you had a lower endoscopy (such as a colonoscopy or flexible sigmoidoscopy) you may notice spotting of blood in your stool or on the toilet paper. If you underwent a bowel prep for your procedure, you may not have a normal bowel movement for a few days.  Please Note:  You might notice some irritation and congestion in your nose or some drainage.  This is from the oxygen used during your procedure.  There is no need for concern and it should clear up in a day or so.  SYMPTOMS TO REPORT IMMEDIATELY:  Following lower endoscopy (colonoscopy or flexible sigmoidoscopy):  Excessive amounts of blood in the stool  Significant tenderness or worsening of abdominal pains  Swelling of the abdomen that is new, acute  Fever of 100F or higher  For urgent or emergent issues, a gastroenterologist can be reached at any hour by calling [phone]. Do not use MyChart messaging for urgent concerns.    DIET:  We do recommend a small meal at first, but then you may proceed to your regular diet.  Drink plenty of fluids but you should avoid alcoholic beverages for 24 hours.  ACTIVITY:  You should plan to take it easy  for the rest of today and you should NOT DRIVE or use heavy machinery until tomorrow (because of the sedation medicines used during the test).    FOLLOW UP: Our staff will call the number listed on your records the next business day following your procedure.  We will call around 7:15- 8:00 am to check on you and address any questions or concerns that you may have regarding the information given to you following your procedure. If we do not reach you, we will leave a message.     If any biopsies were taken you will be contacted by phone or by letter within the next 1-3 weeks.  Please call us at [phone] if you have not heard about the biopsies in 3 weeks.    SIGNATURES/CONFIDENTIALITY: You and/or your care partner have signed paperwork which will be entered into your electronic medical record.  These signatures attest to the fact that that the information above on your After Visit Summary has been reviewed and is understood.  Full responsibility of the confidentiality of this discharge information lies with you and/or your care-partner.

## 2023-05-02 NOTE — Op Note (Signed)
[facility] Patient Name: [name] Procedure Date: 05/02/2023 9:52 AM MRN: [id number] Endoscopist: Wilhemina Bonito. Marina Goodell , MD, [id number] Age: 72 Referring MD:  Date of Birth: [birth year] Gender: Female Account #: [id number] Procedure:                Colonoscopy with cold snare polypectomy x 1 Indications:              High risk colon cancer surveillance: Personal                            history of non-advanced adenoma. Previous                            examinations 2004, 2009, 2019 Medicines:                Monitored Anesthesia Care Procedure:                Pre-Anesthesia Assessment:                           - Prior to the procedure, a History and Physical                            was performed, and patient medications and                            allergies were reviewed. The patient's tolerance of                            previous anesthesia was also reviewed. The risks                            and benefits of the procedure and the sedation                            options and risks were discussed with the patient.                            All questions were answered, and informed consent                            was obtained. Prior Anticoagulants: The patient has                            taken no anticoagulant or antiplatelet agents.                            After reviewing the risks and benefits, the patient                            was deemed in satisfactory condition to undergo the                            procedure.  After obtaining informed consent, the colonoscope                            was passed under direct vision. Throughout the                            procedure, the patient's blood pressure, pulse, and                            oxygen saturations were monitored continuously. The                            [id number]-[id number] Slim SN [id number] was introduced through                            the anus and advanced  to the the cecum, identified                            by appendiceal orifice and ileocecal valve. The                            ileocecal valve, appendiceal orifice, and rectum                            were photographed. The quality of the bowel                            preparation was excellent. The colonoscopy was                            performed without difficulty. The patient tolerated                            the procedure well. The bowel preparation used was                            SUPREP via split dose instruction. Scope In: 10:04:53 AM Scope Out: 10:20:55 AM Scope Withdrawal Time: 0 hours 12 minutes 26 seconds  Total Procedure Duration: 0 hours 16 minutes 2 seconds  Findings:                 A 2 mm polyp was found in the ascending colon. The                            polyp was removed with a cold snare. Resection and                            retrieval were complete.                           Internal hemorrhoids were found during                            retroflexion. The hemorrhoids were small.  The exam was otherwise without abnormality on                            direct and retroflexion views. Rectosigmoid                            stenosis noted. Complications:            No immediate complications. Estimated blood loss:                            None. Estimated Blood Loss:     Estimated blood loss: none. Impression:               - One 2 mm polyp in the ascending colon, removed                            with a cold snare. Resected and retrieved.                           - Internal hemorrhoids.                           - Rectosigmoid stenosis                           - The examination was otherwise normal on direct                            and retroflexion views. Recommendation:           - Repeat colonoscopy in 7-10 years for                            surveillance. ULTRASLIM PEDIATRIC SCOPE                            - Patient has a contact number available for                            emergencies. The signs and symptoms of potential                            delayed complications were discussed with the                            patient. Return to normal activities tomorrow.                            Written discharge instructions were provided to the                            patient.                           - Resume previous diet.                           -  Await pathology results. Wilhemina Bonito. Marina Goodell, MD 05/02/2023 10:27:12 AM This report has been signed electronically.

## 2023-05-02 NOTE — Progress Notes (Signed)
Called to room to assist during endoscopic procedure.  Patient ID and intended procedure confirmed with present staff. Received instructions for my participation in the procedure from the performing physician.  

## 2023-05-02 NOTE — Progress Notes (Signed)
Pt's states no medical or surgical changes since previsit or office visit. 

## 2023-05-02 NOTE — Progress Notes (Signed)
HISTORY OF PRESENT ILLNESS:  [name] is a 72 y.o. female with a history of adenomatous colon polyps.  Now for surveillance colonoscopy.  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   Family history of breast cancer    Family history of colon cancer    Family history of pancreatic cancer    Hot flashes    Hyperlipidemia    Hyperthyroidism    Personal history of chemotherapy 2020   left breast   Personal history of radiation therapy 2016   Personal history of radiation therapy 2020   Radiation 11/25/14-01/06/15   Right Breast    Past Surgical History:  Procedure Laterality Date   AUGMENTATION MAMMAPLASTY Bilateral 03/10/1997   BREAST BIOPSY Right 09/11/2014   BREAST BIOPSY Left 10/21/2018   BREAST LUMPECTOMY Right 10/20/2014   BREAST LUMPECTOMY Left 01/11/2019   BREAST LUMPECTOMY Right 01/10/2021   Procedure: RIGHT BREAST LUMPECTOMY;  Surgeon: Abigail Miyamoto, MD;  Location: [facility];  Service: General;  Laterality: Right;   BREAST LUMPECTOMY WITH RADIOACTIVE SEED AND SENTINEL LYMPH NODE BIOPSY N/A 01/07/2019   Procedure: LEFT BREAST LUMPECTOMY WITH RADIOACTIVE SEED AND LEFT DEEP AXILLARY SENTINEL LYMPH NODE BIOPSY WITH BLUE DYE INJECTION;  Surgeon: Claud Kelp, MD;  Location: [facility];  Service: General;  Laterality: N/A;   COLONOSCOPY     DILATION AND CURETTAGE OF UTERUS     KNEE ARTHROSCOPY     PLACEMENT OF BREAST IMPLANTS  1995   bilat breast implants   TONSILLECTOMY     TUBAL LIGATION     WISDOM TOOTH EXTRACTION      Social History [name]  reports that she has never smoked. She has never used smokeless tobacco. She reports current alcohol use. She reports that she does not use drugs.  family history includes  Alcohol abuse in her brother and paternal uncle; Breast cancer in her mother; Breast cancer (age of onset: 38) in her father; Cancer in her father, maternal grandmother, and maternal uncle; Cancer (age of onset: 59) in her maternal grandfather; Colon cancer in her maternal uncle; Esophageal cancer in her father; Lung cancer in her maternal uncle; Pancreatic cancer (age of onset: 68) in her maternal uncle; Pancreatic cancer (age of onset: 64) in her maternal grandmother; Prostate cancer (age of onset: 64) in her father; Throat cancer (age of onset: 15) in her father.  Allergies  Allergen Reactions   Docetaxel     SOB, chest pressure, dizziness       PHYSICAL EXAMINATION: Vital signs: BP (!) 157/91   Pulse 74   Temp (!) 97.5 F (36.4 C)   Ht 5' (1.524 m)   Wt 140 lb (63.5 kg)   SpO2 97%   BMI 27.34 kg/m  General: Well-developed, well-nourished, no acute distress HEENT: Sclerae are anicteric, conjunctiva pink. Oral mucosa intact Lungs: Clear Heart: Regular Abdomen: soft, nontender, nondistended, no obvious ascites, no peritoneal signs, normal bowel sounds. No organomegaly. Extremities: No edema Psychiatric: alert and oriented x3. Cooperative     ASSESSMENT:  History of adenomatous polyps   PLAN:   Surveillance colonoscopy

## 2023-05-02 NOTE — Progress Notes (Signed)
Sedate, gd SR, tolerated procedure well, VSS, report to RN 

## 2023-05-03 ENCOUNTER — Telehealth: Payer: Self-pay

## 2023-05-03 NOTE — Telephone Encounter (Signed)
Post procedure follow up call, no answer 

## 2023-05-04 DIAGNOSIS — E559 Vitamin D deficiency, unspecified: Secondary | ICD-10-CM | POA: Diagnosis not present

## 2023-05-04 DIAGNOSIS — Z13220 Encounter for screening for lipoid disorders: Secondary | ICD-10-CM | POA: Diagnosis not present

## 2023-05-04 DIAGNOSIS — Z79899 Other long term (current) drug therapy: Secondary | ICD-10-CM | POA: Diagnosis not present

## 2023-05-04 DIAGNOSIS — R946 Abnormal results of thyroid function studies: Secondary | ICD-10-CM | POA: Diagnosis not present

## 2023-05-04 DIAGNOSIS — R7309 Other abnormal glucose: Secondary | ICD-10-CM | POA: Diagnosis not present

## 2023-05-08 ENCOUNTER — Encounter: Payer: Self-pay | Admitting: Internal Medicine

## 2023-05-08 LAB — SURGICAL PATHOLOGY

## 2023-05-11 DIAGNOSIS — F329 Major depressive disorder, single episode, unspecified: Secondary | ICD-10-CM | POA: Diagnosis not present

## 2023-05-11 DIAGNOSIS — Z0000 Encounter for general adult medical examination without abnormal findings: Secondary | ICD-10-CM | POA: Diagnosis not present

## 2023-05-11 DIAGNOSIS — Z853 Personal history of malignant neoplasm of breast: Secondary | ICD-10-CM | POA: Diagnosis not present

## 2023-05-11 DIAGNOSIS — E785 Hyperlipidemia, unspecified: Secondary | ICD-10-CM | POA: Diagnosis not present

## 2023-05-11 DIAGNOSIS — Z23 Encounter for immunization: Secondary | ICD-10-CM | POA: Diagnosis not present

## 2023-05-11 DIAGNOSIS — R059 Cough, unspecified: Secondary | ICD-10-CM | POA: Diagnosis not present

## 2023-07-11 DIAGNOSIS — Z0100 Encounter for examination of eyes and vision without abnormal findings: Secondary | ICD-10-CM | POA: Diagnosis not present

## 2023-07-11 DIAGNOSIS — D3132 Benign neoplasm of left choroid: Secondary | ICD-10-CM | POA: Diagnosis not present

## 2023-08-29 ENCOUNTER — Other Ambulatory Visit: Payer: Self-pay | Admitting: Hematology

## 2023-09-15 NOTE — Progress Notes (Signed)
 [facility] OFFICE PROGRESS NOTE  Irena Reichmann, DO [address] [place] [address] [place] [address]  DIAGNOSIS: Follow up bilateral breast cancer   Oncology History Overview Note  Cancer Staging Breast cancer of upper-inner quadrant of right female breast [facility]) Staging form: Breast, AJCC 7th Edition - Clinical stage from 09/23/2014: Stage 0 (Tis (DCIS), N0, M0) - Unsigned - Pathologic stage from 10/22/2014: Stage 0 (Tis (DCIS), N0, cM0) - Signed by Pecola Leisure, MD on 11/03/2014 Staging comments: Staged on final lumpectomy specimen by Dr. Frederica Kuster  Malignant neoplasm of upper-inner quadrant of left breast in female, estrogen receptor positive (HCC) Staging form: Breast, AJCC 8th Edition - Clinical: cT1b, cN0, cM0, G2, ER+, PR+ - Unsigned    Breast cancer of upper-inner quadrant of right female breast (HCC)  09/11/2014 Mammogram   Right breast: area of pleomorphic calcifications and associated architectural distortion, with irregular density at the 1 o'clock position of the right breast. No other suspicious findings in the right breast.    09/11/2014 Breast US   Right breast: area of hypoechogenecity 2 cm x 0.9 cm x 1.6 cm in size, 12 o'clock position, 3 cm the nipple. No discrete suspicious mass.   09/11/2014 Initial Biopsy   Right breast needle core bx (11-12 o'clock, UOQ): DCIS, grade 3, with necrosis and calcifications, ER- (0%), PR- (0%)   09/11/2014 Pathologic Stage   Stage 0: Tis Nx    09/17/2014 Breast MRI   Non mass enhancement in the upper and slight outer right breast measuring approximately 2.6 x 1.7 x 2.2 cm. Biopsy proven fibrocystic changes in the upper outer periareolar right breast manifested as a 9 x 7 mm mass.   09/24/2014 Procedure   Genetic testing: BRCA plus and BreastNext panels ([place]) performed showing no clinically significant variants detected including ATM, BARD1, BRCA1, BRCA2, BRIP1, CDH1, CHEK2, MRE11A, MUTYH, NBN, NF1, PALB2, PTEN, RAD50, RAD51C,  RAD51D, and TP53.   10/20/2014 Definitive Surgery   Right lumpectomy with SLNB ([place]): DCIS with necrosis and calcifications, grade 3, 1 mm from nearest margin (posterior), 0/2 LN with evidence of malignancy.   10/20/2014 Clinical Stage   Stage 0: Tis N0   11/25/2014 - 01/06/2015 Radiation Therapy   Adjuvant RT completed Michell Heinrich): Right breast 50 Gy over 25 fractions; right breast boost 10 Gy over 5 fractions. Total dose received: 60 Gy.    04/13/2015 Survivorship   Survivorship care plan completed and copy mailed to patient as she was unable to be seen in the Survivorship clinic due to patient's schedule.   11/12/2018 Genetic Testing   Negative genetic testing on the common hereditary cancer panel.  The Hereditary Gene Panel offered by [facility] includes sequencing and/or deletion duplication testing of the following 48 genes: APC, ATM, AXIN2, BARD1, BMPR1A, BRCA1, BRCA2, BRIP1, CDH1, CDK4, CDKN2A (p14ARF), CDKN2A (p16INK4a), CHEK2, CTNNA1, DICER1, EPCAM (Deletion/duplication testing only), GREM1 (promoter region deletion/duplication testing only), KIT, MEN1, MLH1, MSH2, MSH3, MSH6, MUTYH, NBN, NF1, NHTL1, PALB2, PDGFRA, PMS2, POLD1, POLE, PTEN, RAD50, RAD51C, RAD51D, RNF43, SDHB, SDHC, SDHD, SMAD4, SMARCA4. STK11, TP53, TSC1, TSC2, and VHL.  The following genes were evaluated for sequence changes only: SDHA and HOXB13 c.251G>A variant only. The report date is 11/12/2018.  AXIN2 c.815+fdup ([employer]) VUS identified.  This is still a normal result and will not change medical management.   11/05/2020 Mammogram   IMPRESSION: 1. Indeterminate calcifications at the right lumpectomy site spanning 1.0 cm.   2.  Stable postsurgical changes in the left breast.  01/10/2021 Surgery   RIGHT BREAST LUMPECTOMY to remove scar tissue and calcifications by Dr Magnus Ivan   FINAL MICROSCOPIC DIAGNOSIS:   A. BREAST, RIGHT, LUMPECTOMY:  - Benign skin and underlying breast parenchyma with previous  procedure-related  changes, including dystrophic calcifications  - Mild fibrocystic change  - Negative for carcinoma    Malignant neoplasm of upper-inner quadrant of left breast in female, estrogen receptor positive (HCC)  10/18/2018 Initial Diagnosis   Malignant neoplasm of upper-inner quadrant of left breast in female, estrogen receptor positive (HCC)   10/18/2018 Initial Biopsy   Diagnosis Breast, left, needle core biopsy, 10 o'clock, 4 cm fn - INVASIVE MAMMARY CARCINOMA - MAMMARY CARCINOMA IN SITU - SEE COMMENT   10/18/2018 Receptors her2   ER: 100% with strong staining  PR: 80% with strong staining  HER2 Negative    10/18/2018 Imaging   Diagnostic Mammogram 10/18/18  IMPRESSION 2 hypoechoic masses in the left breast at 10 o'clock, 4 cm from the nipple. The larger mass measures 5 x 6 x 5 mm. The second mass measures 3 x 5 x 5 mm. The masses are 7 mm apart and span a total dimension 1.7 cm. No axillary adenopathy on the left.   10/18/2018 Cancer Staging   Staging form: Breast, AJCC 8th Edition - Clinical stage from 10/18/2018: Stage IA (cT1b, cN0, cM0, G2, ER+, PR+, HER2-) - Signed by Malachy Mood, MD on 11/07/2018   10/30/2018 Breast MRI   Breast MRI 10/30/18  IMPRESSION: Biopsy-proven malignancy within the left breast 10 o'clock position. No additional suspicious areas of enhancement identified within either breast.   11/2018 -  Anti-estrogen oral therapy   Anastrozole 1mg  daily starting 11/2018 (before surgery). Held after 01/27/19 for further cancer treatment.    11/12/2018 Genetic Testing   Negative genetic testing on the common hereditary cancer panel.  The Hereditary Gene Panel offered by [facility] includes sequencing and/or deletion duplication testing of the following 48 genes: APC, ATM, AXIN2, BARD1, BMPR1A, BRCA1, BRCA2, BRIP1, CDH1, CDK4, CDKN2A (p14ARF), CDKN2A (p16INK4a), CHEK2, CTNNA1, DICER1, EPCAM (Deletion/duplication testing only), GREM1 (promoter region deletion/duplication testing only),  KIT, MEN1, MLH1, MSH2, MSH3, MSH6, MUTYH, NBN, NF1, NHTL1, PALB2, PDGFRA, PMS2, POLD1, POLE, PTEN, RAD50, RAD51C, RAD51D, RNF43, SDHB, SDHC, SDHD, SMAD4, SMARCA4. STK11, TP53, TSC1, TSC2, and VHL.  The following genes were evaluated for sequence changes only: SDHA and HOXB13 c.251G>A variant only. The report date is 11/12/2018.  AXIN2 c.815+fdup ([employer]) VUS identified.  This is still a normal result and will not change medical management.   01/07/2019 Surgery   LEFT BREAST LUMPECTOMY WITH RADIOACTIVE SEED AND LEFT DEEP AXILLARY SENTINEL LYMPH NODE BIOPSY WITH BLUE DYE INJECTION by Dr Derrell Lolling 01/07/19    01/07/2019 Pathology Results   Diagnosis 01/07/19 1. Breast, lumpectomy, Left w/seed - INVASIVE LOBULAR CARCINOMA, GRADE 2, 1.5 CM. SEE NOTE - CARCINOMA IS 0.4 CM FROM THE SUPERIOR MARGIN AND 0.6 CM FROM THE INFERIOR MARGIN - NO EVIDENCE OF LYMPHOVASCULAR OR PERINEURAL INVASION - BIOPSY SITE CHANGES - SEE ONCOLOGY TABLE 2. Lymph node, sentinel, biopsy, Left axillary #1 - LYMPH NODE, NEGATIVE FOR CARCINOMA (0/1). SEE NOTE 3. Lymph node, sentinel, biopsy, Left axillary #2 - LYMPH NODE, NEGATIVE FOR CARCINOMA (0/1). SEE NOTE 4. Lymph node, sentinel, biopsy, Left - LYMPH NODE, NEGATIVE FOR CARCINOMA (0/1). SEE NOTE 5. Lymph node, sentinel, biopsy, Left - LYMPH NODE, NEGATIVE FOR CARCINOMA (0/1). SEE NOTE 6. Lymph node, sentinel, biopsy, Left axillary #3 - LYMPH NODE, NEGATIVE FOR CARCINOMA (0/1). SEE NOTE  01/07/2019 Cancer Staging   Staging form: Breast, AJCC 8th Edition - Pathologic stage from 01/07/2019: Stage IA (pT1c, pN0, cM0, G2, ER+, PR+, HER2-, Oncotype DX score: 33) - Signed by Malachy Mood, MD on 01/27/2019   01/21/2019 Oncotype testing   RS: 33, 21% distant recurrence risk at 9 years with tamoxifen alone; indicates >15% chemotherapy benefit    02/19/2019 - 05/14/2019 Adjuvant Chemotherapy   Adjuvant TC every 3 weeks for 4 cycles starting 02/19/19. She had allergic reation to Taxol with C2  during infusion. She was able to complete Cytoxan that cycle but not much Taxol. I switched taxol to Abraxane with C3. Completed chemo on 05/14/19.    06/17/2019 - 08/06/2019 Radiation Therapy   Adjuvant Radiation with Dr Roselind Messier 06/17/19-08/06/19    08/2019 -  Anti-estrogen oral therapy   Started adjuvant Exemestane 08/2019    10/20/2019 Survivorship   SCP delivered by Santiago Glad, NP      CURRENT THERAPY:  Antiestrogen therapy, starting 08/2019 (2 months neoadjuvantly, currently on anastrozole since 07/2021) Zometa q6 months x4 doses (osteopenia/osteoporosis)  INTERVAL HISTORY: [name] 73 y.o. female returns to the clinic today for a follow up visit. The patient was last seen in the clinic my colleague, Clayborn Heron, 6 months ago. She has been doing well since last being seen. She continues to take anastrozole and tolerates this well without any concerning complaints. Her blood pressure is a little elevated today. She is not on any anti-hypertensives. She reports her blood pressure typically does this when she comes to the doctor. She has a blood pressure cuff at home but does not use on a regular basis.   Denies breast concerns such as new lump/mass, nipple discharge or inversion, or skin change. Her next mammogram is due in April 2025. Her last DEXA scan was in September 2022  She is here for evaluation and repeat blood work before undergoing her zometa infusion.    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  MEDICATIONS:  Current Outpatient Medications  Medication Sig Dispense Refill   albuterol (VENTOLIN HFA) 108 (90 Base) MCG/ACT inhaler Inhale 2 puffs into the lungs every 4 (four) hours as needed.     anastrozole (ARIMIDEX) 1 MG tablet TAKE 1 TABLET BY MOUTH DAILY. 90 tablet 1   Ascorbic Acid (VITAMIN C) 1000 MG tablet Take 2,000 mg by mouth daily.      Biotin 1 MG CAPS Take 1 1e11 Vector Genomes by mouth daily.     Calcium Carb-Cholecalciferol (CALCIUM 600 + D PO) Take 2 tablets by mouth daily.      cholecalciferol (VITAMIN D3) 25 MCG (1000 UT) tablet Take 1,000 Units by mouth daily.      Melatonin 10 MG CAPS Take 20 mg by mouth at bedtime as needed (sleep).  Multiple Vitamin (MULTIVITAMIN WITH MINERALS) TABS tablet Take 1 tablet by mouth daily.      oxyCODONE (ROXICODONE) 5 MG immediate release tablet Take 1 tablet (5 mg total) by mouth every 6 (six) hours as needed for severe pain or breakthrough pain. (Patient not taking: Reported on 05/02/2023) 15 tablet 0   sertraline (ZOLOFT) 100 MG tablet Take 200 mg by mouth daily.      simvastatin (ZOCOR) 40 MG tablet Take 40 mg by mouth every evening.     tiZANidine (ZANAFLEX) 4 MG tablet Take 4 mg by mouth 3 (three) times daily as needed.     vitamin E 100 UNIT capsule Take 200 Units by mouth daily.      No current facility-administered medications for this visit.    SURGICAL HISTORY:  Past Surgical History:  Procedure Laterality Date   AUGMENTATION MAMMAPLASTY Bilateral 03/10/1997   BREAST BIOPSY Right 09/11/2014   BREAST BIOPSY Left 10/21/2018   BREAST LUMPECTOMY Right 10/20/2014   BREAST LUMPECTOMY Left 01/11/2019   BREAST LUMPECTOMY Right 01/10/2021   Procedure: RIGHT BREAST LUMPECTOMY;  Surgeon: Abigail Miyamoto, MD;  Location: [facility];  Service: General;  Laterality: Right;   BREAST LUMPECTOMY WITH RADIOACTIVE SEED  AND SENTINEL LYMPH NODE BIOPSY N/A 01/07/2019   Procedure: LEFT BREAST LUMPECTOMY WITH RADIOACTIVE SEED AND LEFT DEEP AXILLARY SENTINEL LYMPH NODE BIOPSY WITH BLUE DYE INJECTION;  Surgeon: Claud Kelp, MD;  Location: [facility];  Service: General;  Laterality: N/A;   COLONOSCOPY     DILATION AND CURETTAGE OF UTERUS     KNEE ARTHROSCOPY     PLACEMENT OF BREAST IMPLANTS  1995   bilat breast implants   TONSILLECTOMY     TUBAL LIGATION     WISDOM TOOTH EXTRACTION      REVIEW OF SYSTEMS:   Review of Systems  Constitutional: Negative for appetite change, chills, fatigue, fever and unexpected weight change.  HENT: Negative for mouth sores, nosebleeds, sore throat and trouble swallowing.   Eyes: Negative for eye problems and icterus.  Respiratory: Negative for cough, hemoptysis, shortness of breath and wheezing.   Cardiovascular: Positive for rib pain (broke 3 ribs previously). Negative for leg swelling.  Gastrointestinal: Negative for abdominal pain, constipation, diarrhea, nausea and vomiting.  Genitourinary: Negative for bladder incontinence, difficulty urinating, dysuria, frequency and hematuria.   Musculoskeletal: Negative for back pain, gait problem, neck pain and neck stiffness.  Skin: Negative for itching and rash.  Neurological: Negative for dizziness, extremity weakness, gait problem, headaches, light-headedness and seizures.  Hematological: Negative for adenopathy. Does not bruise/bleed easily.  Psychiatric/Behavioral: Negative for confusion, depression and sleep disturbance. The patient is not nervous/anxious.     PHYSICAL EXAMINATION:  There were no vitals taken for this visit.  ECOG PERFORMANCE STATUS: 1  Physical Exam  Constitutional: Oriented to person, place, and time and well-developed, well-nourished, and in no distress.  HENT:  Head: Normocephalic and atraumatic.  Mouth/Throat: Oropharynx is clear and moist. No oropharyngeal exudate.  Eyes: Conjunctivae are normal.  Right eye exhibits no discharge. Left eye exhibits no discharge. No scleral icterus.  Neck: Normal range of motion. Neck supple.  Cardiovascular: Normal rate, regular rhythm, normal heart sounds and intact distal pulses.   Pulmonary/Chest: Effort normal and breath sounds normal. No respiratory distress. No wheezes. No rales.  Abdominal: Soft. Bowel sounds are normal. Exhibits no distension and no mass. There is no tenderness.  Musculoskeletal: Normal range of motion. Exhibits no edema.  Lymphadenopathy:    No cervical  adenopathy.  Neurological: Alert and oriented to person, place, and time. Exhibits normal muscle tone. Gait normal. Coordination normal.  Skin: Skin is warm and dry. No rash noted. Not diaphoretic. No erythema. No pallor.  Breast exam: Asymmetric without nipple discharge or inversion. S/p bilateral lumpectomy, incisions completely healed. No palpable mass or nodularity in either breast or axilla that I could appreciate.  Psychiatric: Mood, memory and judgment normal.  Vitals reviewed.  LABORATORY DATA: Lab Results  Component Value Date   WBC 5.7 03/20/2023   HGB 14.2 03/20/2023   HCT 42.4 03/20/2023   MCV 88.5 03/20/2023   PLT 197 03/20/2023      Chemistry      Component Value Date/Time   NA 142 03/20/2023 0818   NA 145 09/23/2014 0815   K 4.1 03/20/2023 0818   K 4.5 09/23/2014 0815   CL 108 03/20/2023 0818   CO2 29 03/20/2023 0818   CO2 27 09/23/2014 0815   BUN 17 03/20/2023 0818   BUN 22.2 09/23/2014 0815   CREATININE 0.82 03/20/2023 0818   CREATININE 0.8 09/23/2014 0815      Component Value Date/Time   CALCIUM 8.9 03/20/2023 0818   CALCIUM 9.5 09/23/2014 0815   ALKPHOS 57 03/20/2023 0818   ALKPHOS 88 09/23/2014 0815   AST 18 03/20/2023 0818   AST 19 09/23/2014 0815   ALT 20 03/20/2023 0818   ALT 20 09/23/2014 0815   BILITOT 0.4 03/20/2023 0818   BILITOT 0.37 09/23/2014 0815       RADIOGRAPHIC STUDIES:  No results found.   ASSESSMENT/PLAN:   [name] is a 73 y.o. female with      1. Malignant neoplasm of upper-inner quadrant of left breast, invasive lobular carcinoma, Stage IA, pT1c,N0,M0, ER+/PR+, HER2-, Grade II, RS 33 -Diagnosed in 10/2018. She started neoadjuvant anastrozole in 11/2018 due to the delay of her surgery due to COVID pandemic. This was held after her breast surgery.  -She underwent left breast lumpectomy on 01/07/19. Given her RS 85 and high risk disease, she completed 4 cycles of adjvuant chemo TC and adjuvant Radiation.  -She started antiestrogen therapy 08/2019 (2 months neoadjuvant), on Anastrozole since 07/2021 -[name] is clinically doing well.  Tolerating anastrozole, exam is benign, labs are stable.  Mammogram 11/27/22 was negative.  Overall no clinical concern for recurrence -Continue breast cancer surveillance and anastrozole (7-10 years total due to lobular histology) -Follow-up in 6 months, or sooner if needed   2. 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 She is s/p right lumpectomy and SLNB and adjuvant radiation.  -Given her negative ER/PR status, there is no benefit of antiestrogen therapy.  -Continue surveillance   3. Genetic BRCA plus panel was negative for pathogenetic mutations   4. Bone health  -DEXA in 03/2019 showed osteopenia in the hips -Repeat DEXA showed worsening osteopenia, T-score -2.4 with high FRAX score.  She began Zometa q6 months x4 doses, in 03/2022 -s/p 2nd dose 09/2022, third dose 11/27/2022, fourth dose today.  -We will order a repeat DEXA scan today.   5. Elevated Blood Pressure -Her blood pressure was initially elected at 187/80 -Rechecked and was 158/82 -Advised to monitor BP a few times a week and keep a log of her readings to share with her PCP if it continues to be out of range.      PLAN: -Ordered repeat mammogram and DEXA and today's labs reviewed -Continue breast cancer surveillance and anastrozole -  Proceed with  4rd dose of Zometa today -Follow-up in 6 months or sooner if needed     No orders of the defined types were placed in this encounter.    The total time spent in the appointment was 20-29 minutes  Yoshio Seliga L Kaymarie Wynn, PA-C 09/15/23

## 2023-09-18 ENCOUNTER — Inpatient Hospital Stay: Payer: Medicare HMO

## 2023-09-18 ENCOUNTER — Inpatient Hospital Stay (HOSPITAL_BASED_OUTPATIENT_CLINIC_OR_DEPARTMENT_OTHER): Payer: Medicare HMO | Admitting: Physician Assistant

## 2023-09-18 ENCOUNTER — Inpatient Hospital Stay: Payer: Medicare HMO | Attending: Physician Assistant

## 2023-09-18 VITALS — BP 158/82 | HR 62 | Temp 97.8°F | Resp 13 | Wt 129.4 lb

## 2023-09-18 DIAGNOSIS — C50212 Malignant neoplasm of upper-inner quadrant of left female breast: Secondary | ICD-10-CM

## 2023-09-18 DIAGNOSIS — Z170 Estrogen receptor positive status [ER+]: Secondary | ICD-10-CM | POA: Diagnosis not present

## 2023-09-18 DIAGNOSIS — R030 Elevated blood-pressure reading, without diagnosis of hypertension: Secondary | ICD-10-CM | POA: Diagnosis not present

## 2023-09-18 DIAGNOSIS — C50211 Malignant neoplasm of upper-inner quadrant of right female breast: Secondary | ICD-10-CM

## 2023-09-18 DIAGNOSIS — M8589 Other specified disorders of bone density and structure, multiple sites: Secondary | ICD-10-CM | POA: Diagnosis not present

## 2023-09-18 DIAGNOSIS — Z171 Estrogen receptor negative status [ER-]: Secondary | ICD-10-CM

## 2023-09-18 DIAGNOSIS — Z79811 Long term (current) use of aromatase inhibitors: Secondary | ICD-10-CM | POA: Insufficient documentation

## 2023-09-18 LAB — CBC WITH DIFFERENTIAL (CANCER CENTER ONLY)
Abs Immature Granulocytes: 0.01 10*3/uL (ref 0.00–0.07)
Basophils Absolute: 0 10*3/uL (ref 0.0–0.1)
Basophils Relative: 1 %
Eosinophils Absolute: 0 10*3/uL (ref 0.0–0.5)
Eosinophils Relative: 1 %
HCT: 42.5 % (ref 36.0–46.0)
Hemoglobin: 14.1 g/dL (ref 12.0–15.0)
Immature Granulocytes: 0 %
Lymphocytes Relative: 33 %
Lymphs Abs: 2 10*3/uL (ref 0.7–4.0)
MCH: 29.5 pg (ref 26.0–34.0)
MCHC: 33.2 g/dL (ref 30.0–36.0)
MCV: 88.9 fL (ref 80.0–100.0)
Monocytes Absolute: 0.4 10*3/uL (ref 0.1–1.0)
Monocytes Relative: 7 %
Neutro Abs: 3.5 10*3/uL (ref 1.7–7.7)
Neutrophils Relative %: 58 %
Platelet Count: 190 10*3/uL (ref 150–400)
RBC: 4.78 MIL/uL (ref 3.87–5.11)
RDW: 12 % (ref 11.5–15.5)
WBC Count: 5.9 10*3/uL (ref 4.0–10.5)
nRBC: 0 % (ref 0.0–0.2)

## 2023-09-18 LAB — CMP (CANCER CENTER ONLY)
ALT: 13 U/L (ref 0–44)
AST: 15 U/L (ref 15–41)
Albumin: 4.2 g/dL (ref 3.5–5.0)
Alkaline Phosphatase: 45 U/L (ref 38–126)
Anion gap: 5 (ref 5–15)
BUN: 16 mg/dL (ref 8–23)
CO2: 28 mmol/L (ref 22–32)
Calcium: 9 mg/dL (ref 8.9–10.3)
Chloride: 109 mmol/L (ref 98–111)
Creatinine: 0.72 mg/dL (ref 0.44–1.00)
GFR, Estimated: >60 mL/min (ref >60)
Glucose, Bld: 103 mg/dL — ABNORMAL HIGH (ref 70–99)
Potassium: 4 mmol/L (ref 3.5–5.1)
Sodium: 142 mmol/L (ref 135–145)
Total Bilirubin: 0.5 mg/dL (ref 0.0–1.2)
Total Protein: 6.4 g/dL — ABNORMAL LOW (ref 6.5–8.1)

## 2023-09-18 MED ORDER — ZOLEDRONIC ACID 4 MG/100ML IV SOLN
4.0000 mg | Freq: Once | INTRAVENOUS | Status: AC
Start: 2023-09-18 — End: 2023-09-18
  Administered 2023-09-18: 4 mg via INTRAVENOUS
  Filled 2023-09-18: qty 100

## 2023-09-18 MED ORDER — SODIUM CHLORIDE 0.9 % IV SOLN
Freq: Once | INTRAVENOUS | Status: AC
Start: 2023-09-18 — End: 2023-09-18

## 2023-09-18 NOTE — Patient Instructions (Signed)

## 2023-11-27 DIAGNOSIS — L821 Other seborrheic keratosis: Secondary | ICD-10-CM | POA: Diagnosis not present

## 2023-11-27 DIAGNOSIS — D225 Melanocytic nevi of trunk: Secondary | ICD-10-CM | POA: Diagnosis not present

## 2023-11-27 DIAGNOSIS — L814 Other melanin hyperpigmentation: Secondary | ICD-10-CM | POA: Diagnosis not present

## 2023-11-28 ENCOUNTER — Ambulatory Visit
Admission: RE | Admit: 2023-11-28 | Discharge: 2023-11-28 | Disposition: A | Discharge status: Home / Self Care | Payer: Medicare HMO | Source: Ambulatory Visit | Attending: Nurse Practitioner

## 2023-11-28 DIAGNOSIS — Z170 Estrogen receptor positive status [ER+]: Secondary | ICD-10-CM

## 2023-11-28 DIAGNOSIS — Z171 Estrogen receptor negative status [ER-]: Secondary | ICD-10-CM

## 2023-11-28 DIAGNOSIS — Z1231 Encounter for screening mammogram for malignant neoplasm of breast: Secondary | ICD-10-CM | POA: Diagnosis not present

## 2024-01-21 DIAGNOSIS — M810 Age-related osteoporosis without current pathological fracture: Secondary | ICD-10-CM | POA: Diagnosis not present

## 2024-03-04 ENCOUNTER — Other Ambulatory Visit: Payer: Self-pay | Admitting: Hematology

## 2024-03-17 ENCOUNTER — Other Ambulatory Visit: Payer: Self-pay

## 2024-03-17 DIAGNOSIS — C50212 Malignant neoplasm of upper-inner quadrant of left female breast: Secondary | ICD-10-CM

## 2024-03-18 ENCOUNTER — Encounter: Payer: Self-pay | Admitting: Nurse Practitioner

## 2024-03-18 ENCOUNTER — Inpatient Hospital Stay (HOSPITAL_BASED_OUTPATIENT_CLINIC_OR_DEPARTMENT_OTHER): Payer: Medicare HMO | Admitting: Nurse Practitioner

## 2024-03-18 ENCOUNTER — Inpatient Hospital Stay: Payer: Medicare HMO | Attending: Nurse Practitioner

## 2024-03-18 VITALS — BP 138/80 | HR 60 | Temp 97.8°F | Resp 17 | Wt 125.8 lb

## 2024-03-18 DIAGNOSIS — Z170 Estrogen receptor positive status [ER+]: Secondary | ICD-10-CM

## 2024-03-18 DIAGNOSIS — Z9221 Personal history of antineoplastic chemotherapy: Secondary | ICD-10-CM | POA: Insufficient documentation

## 2024-03-18 DIAGNOSIS — C50212 Malignant neoplasm of upper-inner quadrant of left female breast: Secondary | ICD-10-CM | POA: Diagnosis not present

## 2024-03-18 DIAGNOSIS — Z923 Personal history of irradiation: Secondary | ICD-10-CM | POA: Diagnosis not present

## 2024-03-18 DIAGNOSIS — Z17411 Hormone receptor positive with human epidermal growth factor receptor 2 negative status: Secondary | ICD-10-CM | POA: Insufficient documentation

## 2024-03-18 DIAGNOSIS — Z171 Estrogen receptor negative status [ER-]: Secondary | ICD-10-CM | POA: Diagnosis not present

## 2024-03-18 DIAGNOSIS — C50211 Malignant neoplasm of upper-inner quadrant of right female breast: Secondary | ICD-10-CM | POA: Diagnosis not present

## 2024-03-18 DIAGNOSIS — Z79811 Long term (current) use of aromatase inhibitors: Secondary | ICD-10-CM | POA: Diagnosis not present

## 2024-03-18 LAB — CBC WITH DIFFERENTIAL (CANCER CENTER ONLY)
Abs Immature Granulocytes: 0.01 K/uL (ref 0.00–0.07)
Basophils Absolute: 0 K/uL (ref 0.0–0.1)
Basophils Relative: 1 %
Eosinophils Absolute: 0 K/uL (ref 0.0–0.5)
Eosinophils Relative: 1 %
HCT: 42 % (ref 36.0–46.0)
Hemoglobin: 14 g/dL (ref 12.0–15.0)
Immature Granulocytes: 0 %
Lymphocytes Relative: 36 %
Lymphs Abs: 2.2 K/uL (ref 0.7–4.0)
MCH: 29.4 pg (ref 26.0–34.0)
MCHC: 33.3 g/dL (ref 30.0–36.0)
MCV: 88.2 fL (ref 80.0–100.0)
Monocytes Absolute: 0.4 K/uL (ref 0.1–1.0)
Monocytes Relative: 7 %
Neutro Abs: 3.4 K/uL (ref 1.7–7.7)
Neutrophils Relative %: 55 %
Platelet Count: 219 K/uL (ref 150–400)
RBC: 4.76 MIL/uL (ref 3.87–5.11)
RDW: 11.9 % (ref 11.5–15.5)
WBC Count: 6.1 K/uL (ref 4.0–10.5)
nRBC: 0 % (ref 0.0–0.2)

## 2024-03-18 LAB — CMP (CANCER CENTER ONLY)
ALT: 15 U/L (ref 0–44)
AST: 18 U/L (ref 15–41)
Albumin: 4.5 g/dL (ref 3.5–5.0)
Alkaline Phosphatase: 49 U/L (ref 38–126)
Anion gap: 4 — ABNORMAL LOW (ref 5–15)
BUN: 16 mg/dL (ref 8–23)
CO2: 34 mmol/L — ABNORMAL HIGH (ref 22–32)
Calcium: 9.5 mg/dL (ref 8.9–10.3)
Chloride: 105 mmol/L (ref 98–111)
Creatinine: 0.74 mg/dL (ref 0.44–1.00)
GFR, Estimated: >60 mL/min (ref >60)
Glucose, Bld: 100 mg/dL — ABNORMAL HIGH (ref 70–99)
Potassium: 4.2 mmol/L (ref 3.5–5.1)
Sodium: 143 mmol/L (ref 135–145)
Total Bilirubin: 0.4 mg/dL (ref 0.0–1.2)
Total Protein: 6.9 g/dL (ref 6.5–8.1)

## 2024-03-18 NOTE — Assessment & Plan Note (Signed)
-  She was diagnosed in 09/2014. She is s/p right lumpectomy and SLNB and adjuvant radiation.  -Given her negative ER/PR status, there is no benefit of antiestrogen therapy.  -Bilateral screening mammogram done 11/28/2023 was benign without evidence of malignancy in either breast. -Continue surveillance

## 2024-03-18 NOTE — Assessment & Plan Note (Addendum)
 invasive lobular carcinoma, Stage IA, pT1c,N0,M0, ER+/PR+, HER2-, Grade II, RS 33 -Diagnosed in 10/2018. She started neoadjuvant anastrozole  in 11/2018 due to the delay of her surgery due to COVID pandemic. This was held after her breast surgery.  -She underwent left breast lumpectomy on 01/07/19. Given her RS 34 and high risk disease, she completed 4 cycles of adjvuant chemo TC and adjuvant Radiation.  -She started antiestrogen therapy 08/2019 (2 months neoadjuvant), on Anastrozole  since 07/2021 -Ms. Bouchie is clinically doing well.  Tolerating anastrozole , exam is benign, labs are stable.   -Bilateral screening mammogram, done 11/28/2023, was benign without evidence of malignancy in either breast. -Continue breast cancer surveillance and anastrozole  (7-10 years total due to lobular histology) -Follow-up in 6 months, or sooner if needed

## 2024-03-18 NOTE — Progress Notes (Signed)
 Patient Care Team: Gerome Brunet, DO as PCP - General (Family Medicine) Vanderbilt Ned, MD as Consulting Physician (General Surgery) Lanny Callander, MD as Consulting Physician (Hematology) Keenan Hastings, MD as Consulting Physician (Radiation Oncology) Tyree Nanetta SAILOR, RN as Registered Nurse Glean Stephane BROCKS, RN (Inactive) as Registered Nurse Letha Truman ORN, NP (Inactive) as Nurse Practitioner (Nurse Practitioner) Moses Powell Hummer, NP as Nurse Practitioner (Nurse Practitioner) Josephina Aures, MD as Consulting Physician (Endocrinology) Burton, Lacie K, NP as Nurse Practitioner (Nurse Practitioner)  Clinic Day:  03/18/2024  Referring physician: Gerome Brunet, DO  ASSESSMENT & PLAN:   Assessment & Plan: Malignant neoplasm of upper-inner quadrant of left breast in female, 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 She started neoadjuvant anastrozole  in 11/2018 due to the delay of her surgery due to COVID pandemic. This was held after her breast surgery.  -She underwent left breast lumpectomy on 01/07/19. Given her RS 71 and high risk disease, she completed 4 cycles of adjvuant chemo TC and adjuvant Radiation.  -She started antiestrogen therapy 08/2019 (2 months neoadjuvant), on Anastrozole  since 07/2021 -[name] is clinically doing well.  Tolerating anastrozole , exam is benign, labs are stable.   -Bilateral screening mammogram, done 11/28/2023, was benign without evidence of malignancy in either breast. -Continue breast cancer surveillance and anastrozole  (7-10 years total due to lobular histology) -Follow-up in 6 months, or sooner if needed  Breast cancer of upper-inner quadrant of right female breast [facility]) -She was diagnosed in 09/2014. She is s/p right lumpectomy and SLNB and adjuvant radiation.  -Given her negative ER/PR status, there is no benefit of antiestrogen therapy.   -Bilateral screening mammogram done 11/28/2023 was benign without evidence of malignancy in either breast. -Continue surveillance   Hot flashes The patient states she gets occasional hot flashes, approximately 1 time weekly.  States they are manageable.  Discussed OTC peppermint oil apply topically as needed to help improve symptoms associated with hot flashes.  Plan Labs reviewed. - CBC and CMP are unremarkable. -Recent screening mammogram done 11/28/2023 with benign.  No evidence of malignancy.  Recall in April 2026. -Get bone density results from Dr. Gerome.  Recommend daily calcium and vitamin D along with low impact exercise every day.  Repeat in 2027. Continue anastrozole  every day. Labs and follow-up in 6 months, sooner if needed.    The patient understands the plans discussed today and is in agreement with them.  She knows to contact our office if she develops concerns prior to her next appointment.  I provided 25 minutes of face-to-face time during this encounter and > 50% was spent counseling as documented under my assessment and plan.    Powell FORBES Lessen, NP  [facility] - A DEPT OF [facility] [address] [place] [address] Dept: [phone] Dept Fax: [phone]   No orders of the defined types were placed in this encounter.     CHIEF COMPLAINT:  CC: right breast cancer, estrogen receptor negative; Left breast cancer, estrogen receptor positive   Current Treatment:  anastrozole  daily (started 07/2021) - goal of 10 years  INTERVAL HISTORY:  [name] is here today for repeat clinical assessment. She last saw Cassie, PA on 09/18/2023. She had screening mammogram 11/28/2023 which showed no evidence of malignancy. Recall expeced in 11/2024. She is due to have DEXA scan.  She states she had DEXA  scan through her primary care, Dr. Gerome.  She states that results from this bone density were better than  prior studies.  She reports intermittent hot flashes.  She is getting them approximately once or twice a week.  She denies lumps or masses along the chest wall or under the arms, bilaterally.  She denies chest pain, chest pressure, or shortness of breath. She denies headaches or visual disturbances. She denies abdominal pain, nausea, vomiting, or changes in bowel or bladder habits.  She denies fevers or chills. She denies pain. Her appetite is good. Her weight has decreased 4 pounds over last 6 months.  I have reviewed the past medical history, past surgical history, social history and family history with the patient and they are unchanged from previous note.  ALLERGIES:  is allergic to docetaxel .  MEDICATIONS:  Current Outpatient Medications  Medication Sig Dispense Refill   albuterol (VENTOLIN HFA) 108 (90 Base) MCG/ACT inhaler Inhale 2 puffs into the lungs every 4 (four) hours as needed.     anastrozole  (ARIMIDEX ) 1 MG tablet TAKE 1 TABLET BY MOUTH DAILY. 90 tablet 1   Ascorbic Acid (VITAMIN C) 1000 MG tablet Take 2,000 mg by mouth daily.      Biotin 1 MG CAPS Take 1 1e11 Vector Genomes by mouth daily.     Calcium Carb-Cholecalciferol (CALCIUM 600 + D PO) Take 2 tablets by mouth daily.      cholecalciferol (VITAMIN D3) 25 MCG (1000 UT) tablet Take 1,000 Units by mouth daily.      Melatonin 10 MG CAPS Take 20 mg by mouth at bedtime as needed (sleep).      Multiple Vitamin (MULTIVITAMIN WITH MINERALS) TABS tablet Take 1 tablet by mouth daily.      oxyCODONE  (ROXICODONE ) 5 MG immediate release tablet Take 1 tablet (5 mg total) by mouth every 6 (six) hours as needed for severe pain or breakthrough pain. 15 tablet 0   sertraline (ZOLOFT) 100 MG tablet Take 200 mg by mouth daily.      simvastatin (ZOCOR) 40 MG tablet Take 40 mg by mouth every evening.     tiZANidine (ZANAFLEX) 4 MG tablet Take 4 mg by mouth 3 (three) times daily as needed.     vitamin E 100 UNIT capsule Take 200 Units by mouth  daily.      No current facility-administered medications for this visit.    HISTORY OF PRESENT ILLNESS:   Oncology History Overview Note  Cancer Staging Breast cancer of upper-inner quadrant of right female breast [facility]) Staging form: Breast, AJCC 7th Edition - Clinical stage from 09/23/2014: Stage 0 (Tis (DCIS), N0, M0) - Unsigned - Pathologic stage from 10/22/2014: Stage 0 (Tis (DCIS), N0, cM0) - Signed by Guinevere Chew, MD on 11/03/2014 Staging comments: Staged on final lumpectomy specimen by Dr. Loral  Malignant neoplasm of upper-inner quadrant of left breast in female, estrogen receptor positive (HCC) Staging form: Breast, AJCC 8th Edition - Clinical: cT1b, cN0, cM0, G2, ER+, PR+ - Unsigned    Breast cancer of upper-inner quadrant of right female breast (HCC)  09/11/2014 Mammogram   Right breast: area of pleomorphic calcifications and associated architectural distortion, with irregular density at the 1 o'clock position of the right breast. No other suspicious findings in the right breast.    09/11/2014 Breast US    Right breast: area of hypoechogenecity 2 cm x 0.9 cm x 1.6 cm in size, 12 o'clock position, 3 cm the nipple. No discrete suspicious mass.   09/11/2014 Initial  Biopsy   Right breast needle core bx (11-12 o'clock, UOQ): DCIS, grade 3, with necrosis and calcifications, ER- (0%), PR- (0%)   09/11/2014 Pathologic Stage   Stage 0: Tis Nx    09/17/2014 Breast MRI   Non mass enhancement in the upper and slight outer right breast measuring approximately 2.6 x 1.7 x 2.2 cm. Biopsy proven fibrocystic changes in the upper outer periareolar right breast manifested as a 9 x 7 mm mass.   09/24/2014 Procedure   Genetic testing: BRCA plus and BreastNext panels ([place]) performed showing no clinically significant variants detected including ATM, BARD1, BRCA1, BRCA2, BRIP1, CDH1, CHEK2, MRE11A, MUTYH, NBN, NF1, PALB2, PTEN, RAD50, RAD51C, RAD51D, and TP53.   10/20/2014 Definitive Surgery   Right  lumpectomy with SLNB ([place]): DCIS with necrosis and calcifications, grade 3, 1 mm from nearest margin (posterior), 0/2 LN with evidence of malignancy.   10/20/2014 Clinical Stage   Stage 0: Tis N0   11/25/2014 - 01/06/2015 Radiation Therapy   Adjuvant RT completed Signe): Right breast 50 Gy over 25 fractions; right breast boost 10 Gy over 5 fractions. Total dose received: 60 Gy.    04/13/2015 Survivorship   Survivorship care plan completed and copy mailed to patient as she was unable to be seen in the Survivorship clinic due to patient's schedule.   11/12/2018 Genetic Testing   Negative genetic testing on the common hereditary cancer panel.  The Hereditary Gene Panel offered by [facility] includes sequencing and/or deletion duplication testing of the following 48 genes: APC, ATM, AXIN2, BARD1, BMPR1A, BRCA1, BRCA2, BRIP1, CDH1, CDK4, CDKN2A (p14ARF), CDKN2A (p16INK4a), CHEK2, CTNNA1, DICER1, EPCAM (Deletion/duplication testing only), GREM1 (promoter region deletion/duplication testing only), KIT, MEN1, MLH1, MSH2, MSH3, MSH6, MUTYH, NBN, NF1, NHTL1, PALB2, PDGFRA, PMS2, POLD1, POLE, PTEN, RAD50, RAD51C, RAD51D, RNF43, SDHB, SDHC, SDHD, SMAD4, SMARCA4. STK11, TP53, TSC1, TSC2, and VHL.  The following genes were evaluated for sequence changes only: SDHA and HOXB13 c.251G>A variant only. The report date is 11/12/2018.  AXIN2 c.815+fdup (Intronic) VUS identified.  This is still a normal result and will not change medical management.   11/05/2020 Mammogram   IMPRESSION: 1. Indeterminate calcifications at the right lumpectomy site spanning 1.0 cm.   2.  Stable postsurgical changes in the left breast.   01/10/2021 Surgery   RIGHT BREAST LUMPECTOMY to remove scar tissue and calcifications by Dr Vernetta   FINAL MICROSCOPIC DIAGNOSIS:   A. BREAST, RIGHT, LUMPECTOMY:  - Benign skin and underlying breast parenchyma with previous  procedure-related changes, including dystrophic calcifications  - Mild  fibrocystic change  - Negative for carcinoma    Malignant neoplasm of upper-inner quadrant of left breast in female, estrogen receptor positive (HCC)  10/18/2018 Initial Diagnosis   Malignant neoplasm of upper-inner quadrant of left breast in female, estrogen receptor positive (HCC)   10/18/2018 Initial Biopsy   Diagnosis Breast, left, needle core biopsy, 10 o'clock, 4 cm fn - INVASIVE MAMMARY CARCINOMA - MAMMARY CARCINOMA IN SITU - SEE COMMENT   10/18/2018 Receptors her2   ER: 100% with strong staining  PR: 80% with strong staining  HER2 Negative    10/18/2018 Imaging   Diagnostic Mammogram 10/18/18  IMPRESSION 2 hypoechoic masses in the left breast at 10 o'clock, 4 cm from the nipple. The larger mass measures 5 x 6 x 5 mm. The second mass measures 3 x 5 x 5 mm. The masses are 7 mm apart and span a total dimension 1.7 cm. No axillary adenopathy on the left.  10/18/2018 Cancer Staging   Staging form: Breast, AJCC 8th Edition - Clinical stage from 10/18/2018: Stage IA (cT1b, cN0, cM0, G2, ER+, PR+, HER2-) - Signed by Lanny Callander, MD on 11/07/2018   10/30/2018 Breast MRI   Breast MRI 10/30/18  IMPRESSION: Biopsy-proven malignancy within the left breast 10 o'clock position. No additional suspicious areas of enhancement identified within either breast.   11/2018 -  Anti-estrogen oral therapy   Anastrozole  1mg  daily starting 11/2018 (before surgery). Held after 01/27/19 for further cancer treatment.    11/12/2018 Genetic Testing   Negative genetic testing on the common hereditary cancer panel.  The Hereditary Gene Panel offered by [facility] includes sequencing and/or deletion duplication testing of the following 48 genes: APC, ATM, AXIN2, BARD1, BMPR1A, BRCA1, BRCA2, BRIP1, CDH1, CDK4, CDKN2A (p14ARF), CDKN2A (p16INK4a), CHEK2, CTNNA1, DICER1, EPCAM (Deletion/duplication testing only), GREM1 (promoter region deletion/duplication testing only), KIT, MEN1, MLH1, MSH2, MSH3, MSH6, MUTYH, NBN, NF1,  NHTL1, PALB2, PDGFRA, PMS2, POLD1, POLE, PTEN, RAD50, RAD51C, RAD51D, RNF43, SDHB, SDHC, SDHD, SMAD4, SMARCA4. STK11, TP53, TSC1, TSC2, and VHL.  The following genes were evaluated for sequence changes only: SDHA and HOXB13 c.251G>A variant only. The report date is 11/12/2018.  AXIN2 c.815+fdup (Intronic) VUS identified.  This is still a normal result and will not change medical management.   01/07/2019 Surgery   LEFT BREAST LUMPECTOMY WITH RADIOACTIVE SEED AND LEFT DEEP AXILLARY SENTINEL LYMPH NODE BIOPSY WITH BLUE DYE INJECTION by Dr Gail 01/07/19    01/07/2019 Pathology Results   Diagnosis 01/07/19 1. Breast, lumpectomy, Left w/seed - INVASIVE LOBULAR CARCINOMA, GRADE 2, 1.5 CM. SEE NOTE - CARCINOMA IS 0.4 CM FROM THE SUPERIOR MARGIN AND 0.6 CM FROM THE INFERIOR MARGIN - NO EVIDENCE OF LYMPHOVASCULAR OR PERINEURAL INVASION - BIOPSY SITE CHANGES - SEE ONCOLOGY TABLE 2. Lymph node, sentinel, biopsy, Left axillary #1 - LYMPH NODE, NEGATIVE FOR CARCINOMA (0/1). SEE NOTE 3. Lymph node, sentinel, biopsy, Left axillary #2 - LYMPH NODE, NEGATIVE FOR CARCINOMA (0/1). SEE NOTE 4. Lymph node, sentinel, biopsy, Left - LYMPH NODE, NEGATIVE FOR CARCINOMA (0/1). SEE NOTE 5. Lymph node, sentinel, biopsy, Left - LYMPH NODE, NEGATIVE FOR CARCINOMA (0/1). SEE NOTE 6. Lymph node, sentinel, biopsy, Left axillary #3 - LYMPH NODE, NEGATIVE FOR CARCINOMA (0/1). SEE NOTE   01/07/2019 Cancer Staging   Staging form: Breast, AJCC 8th Edition - Pathologic stage from 01/07/2019: Stage IA (pT1c, pN0, cM0, G2, ER+, PR+, HER2-, Oncotype DX score: 33) - Signed by Lanny Callander, MD on 01/27/2019   01/21/2019 Oncotype testing   RS: 33, 21% distant recurrence risk at 9 years with tamoxifen alone; indicates >15% chemotherapy benefit    02/19/2019 - 05/14/2019 Adjuvant Chemotherapy   Adjuvant TC every 3 weeks for 4 cycles starting 02/19/19. She had allergic reation to Taxol  with C2 during infusion. She was able to complete Cytoxan  that  cycle but not much Taxol . I switched taxol  to Abraxane  with C3. Completed chemo on 05/14/19.    06/17/2019 - 08/06/2019 Radiation Therapy   Adjuvant Radiation with Dr Shannon 06/17/19-08/06/19    08/2019 -  Anti-estrogen oral therapy   Started adjuvant Exemestane  08/2019    10/20/2019 Survivorship   SCP delivered by Lacie Burton, NP        REVIEW OF SYSTEMS:   Constitutional: Denies fevers, chills or abnormal weight loss Eyes: Denies blurriness of vision Ears, nose, mouth, throat, and face: Denies mucositis or sore throat Respiratory: Denies cough, dyspnea or wheezes Cardiovascular: Denies palpitation, chest discomfort or lower extremity swelling  Gastrointestinal:  Denies nausea, heartburn or change in bowel habits Skin: Denies abnormal skin rashes Lymphatics: Denies new lymphadenopathy or easy bruising Neurological:Denies numbness, tingling or new weaknesses Behavioral/Psych: Mood is stable, no new changes  All other systems were reviewed with the patient and are negative.   VITALS:   Today's Vitals   03/18/24 1038 03/18/24 1112  BP: 138/80   Pulse: 60   Resp: 17   Temp: 97.8 F (36.6 C)   SpO2: 98%   Weight: 125 lb 12.8 oz (57.1 kg)   PainSc:  0-No pain   Body mass index is 24.57 kg/m.   Wt Readings from Last 3 Encounters:  03/18/24 125 lb 12.8 oz (57.1 kg)  09/18/23 129 lb 6.4 oz (58.7 kg)  05/02/23 140 lb (63.5 kg)    Body mass index is 24.57 kg/m.  Performance status (ECOG): 1 - Symptomatic but completely ambulatory  PHYSICAL EXAM:   GENERAL:alert, no distress and comfortable SKIN: skin color, texture, turgor are normal, no rashes or significant lesions EYES: normal, Conjunctiva are pink and non-injected, sclera clear OROPHARYNX:no exudate, no erythema and lips, buccal mucosa, and tongue normal  NECK: supple, thyroid  normal size, non-tender, without nodularity LYMPH:  no palpable lymphadenopathy in the cervical, axillary or inguinal LUNGS: clear to  auscultation and percussion with normal breathing effort HEART: regular rate & rhythm and no murmurs and no lower extremity edema ABDOMEN:abdomen soft, non-tender and normal bowel sounds Musculoskeletal:no cyanosis of digits and no clubbing  NEURO: alert & oriented x 3 with fluent speech, no focal motor/sensory deficits BREAST: There is horizontal surgical scar across the upper quadrants of the right breast.  There is retropectoral implant present on the right side which seems intact.  There is no axillary lymphadenopathy on the right side.  There are no palpable lumps or masses along the left chest wall.  There is no nipple inversion or nipple discharge.  There are well-healed surgical scars around the areola and along the inferior aspect of the left breast.  There is no axillary lymphadenopathy on the left side.  LABORATORY DATA:  I have reviewed the data as listed    Component Value Date/Time   NA 143 03/18/2024 1021   NA 145 09/23/2014 0815   K 4.2 03/18/2024 1021   K 4.5 09/23/2014 0815   CL 105 03/18/2024 1021   CO2 34 (H) 03/18/2024 1021   CO2 27 09/23/2014 0815   GLUCOSE 100 (H) 03/18/2024 1021   GLUCOSE 95 09/23/2014 0815   BUN 16 03/18/2024 1021   BUN 22.2 09/23/2014 0815   CREATININE 0.74 03/18/2024 1021   CREATININE 0.8 09/23/2014 0815   CALCIUM 9.5 03/18/2024 1021   CALCIUM 9.5 09/23/2014 0815   PROT 6.9 03/18/2024 1021   PROT 6.6 09/23/2014 0815   ALBUMIN 4.5 03/18/2024 1021   ALBUMIN 3.9 09/23/2014 0815   AST 18 03/18/2024 1021   AST 19 09/23/2014 0815   ALT 15 03/18/2024 1021   ALT 20 09/23/2014 0815   ALKPHOS 49 03/18/2024 1021   ALKPHOS 88 09/23/2014 0815   BILITOT 0.4 03/18/2024 1021   BILITOT 0.37 09/23/2014 0815   GFRNONAA >60 03/18/2024 1021   GFRAA >60 07/22/2019 1155    Lab Results  Component Value Date   WBC 6.1 03/18/2024   NEUTROABS 3.4 03/18/2024   HGB 14.0 03/18/2024   HCT 42.0 03/18/2024   MCV 88.2 03/18/2024   PLT 219 03/18/2024

## 2024-03-20 ENCOUNTER — Telehealth: Payer: Self-pay | Admitting: Nurse Practitioner

## 2024-03-20 NOTE — Telephone Encounter (Signed)
 Scheduled appointments with the patient per 8/12 LOS notes.

## 2024-05-09 DIAGNOSIS — E0500 Thyrotoxicosis with diffuse goiter without thyrotoxic crisis or storm: Secondary | ICD-10-CM | POA: Diagnosis not present

## 2024-05-09 DIAGNOSIS — R7309 Other abnormal glucose: Secondary | ICD-10-CM | POA: Diagnosis not present

## 2024-05-09 DIAGNOSIS — E559 Vitamin D deficiency, unspecified: Secondary | ICD-10-CM | POA: Diagnosis not present

## 2024-05-09 DIAGNOSIS — Z79899 Other long term (current) drug therapy: Secondary | ICD-10-CM | POA: Diagnosis not present

## 2024-05-09 DIAGNOSIS — E785 Hyperlipidemia, unspecified: Secondary | ICD-10-CM | POA: Diagnosis not present

## 2024-05-21 DIAGNOSIS — F329 Major depressive disorder, single episode, unspecified: Secondary | ICD-10-CM | POA: Diagnosis not present

## 2024-05-21 DIAGNOSIS — Z0000 Encounter for general adult medical examination without abnormal findings: Secondary | ICD-10-CM | POA: Diagnosis not present

## 2024-05-21 DIAGNOSIS — M8580 Other specified disorders of bone density and structure, unspecified site: Secondary | ICD-10-CM | POA: Diagnosis not present

## 2024-05-21 DIAGNOSIS — E559 Vitamin D deficiency, unspecified: Secondary | ICD-10-CM | POA: Diagnosis not present

## 2024-05-21 DIAGNOSIS — R7989 Other specified abnormal findings of blood chemistry: Secondary | ICD-10-CM | POA: Diagnosis not present

## 2024-05-21 DIAGNOSIS — E785 Hyperlipidemia, unspecified: Secondary | ICD-10-CM | POA: Diagnosis not present

## 2024-05-21 DIAGNOSIS — R7309 Other abnormal glucose: Secondary | ICD-10-CM | POA: Diagnosis not present

## 2024-05-21 DIAGNOSIS — Z853 Personal history of malignant neoplasm of breast: Secondary | ICD-10-CM | POA: Diagnosis not present

## 2024-09-05 ENCOUNTER — Other Ambulatory Visit: Payer: Self-pay | Admitting: Hematology

## 2024-09-22 ENCOUNTER — Ambulatory Visit: Admitting: Nurse Practitioner

## 2024-09-22 ENCOUNTER — Other Ambulatory Visit
